# Patient Record
Sex: Female | Born: 1985 | Race: Black or African American | Hispanic: No | Marital: Married | Smoking: Never smoker
Health system: Southern US, Community
[De-identification: ages and names within clinical notes are randomized; demographics above are authoritative.]

## PROBLEM LIST (undated history)

## (undated) DIAGNOSIS — I10 Essential (primary) hypertension: Secondary | ICD-10-CM

## (undated) DIAGNOSIS — Q251 Coarctation of aorta: Secondary | ICD-10-CM

## (undated) DIAGNOSIS — N390 Urinary tract infection, site not specified: Secondary | ICD-10-CM

## (undated) DIAGNOSIS — D649 Anemia, unspecified: Secondary | ICD-10-CM

## (undated) DIAGNOSIS — F419 Anxiety disorder, unspecified: Secondary | ICD-10-CM

## (undated) HISTORY — PX: COARCTATION OF AORTA REPAIR: SHX261

## (undated) HISTORY — DX: Coarctation of aorta: Q25.1

## (undated) HISTORY — PX: TONSILLECTOMY: SUR1361

## (undated) HISTORY — PX: REFRACTIVE SURGERY: SHX103

## (undated) HISTORY — DX: Essential (primary) hypertension: I10

## (undated) HISTORY — PX: BREAST ENHANCEMENT SURGERY: SHX7

---

## 2013-04-27 DIAGNOSIS — I359 Nonrheumatic aortic valve disorder, unspecified: Secondary | ICD-10-CM | POA: Insufficient documentation

## 2013-05-17 DIAGNOSIS — Q251 Coarctation of aorta: Secondary | ICD-10-CM

## 2013-05-17 HISTORY — DX: Coarctation of aorta: Q25.1

## 2019-07-28 ENCOUNTER — Encounter (INDEPENDENT_AMBULATORY_CARE_PROVIDER_SITE_OTHER): Payer: Self-pay

## 2019-07-28 ENCOUNTER — Ambulatory Visit (INDEPENDENT_AMBULATORY_CARE_PROVIDER_SITE_OTHER): Payer: BC Managed Care – PPO | Admitting: Cardiology

## 2019-07-28 ENCOUNTER — Other Ambulatory Visit: Payer: Self-pay

## 2019-07-28 ENCOUNTER — Encounter: Payer: Self-pay | Admitting: Cardiology

## 2019-07-28 VITALS — BP 135/94 | HR 92 | Ht 68.0 in | Wt 185.4 lb

## 2019-07-28 DIAGNOSIS — Q231 Congenital insufficiency of aortic valve: Secondary | ICD-10-CM

## 2019-07-28 DIAGNOSIS — Z7189 Other specified counseling: Secondary | ICD-10-CM | POA: Diagnosis not present

## 2019-07-28 DIAGNOSIS — Q251 Coarctation of aorta: Secondary | ICD-10-CM | POA: Diagnosis not present

## 2019-07-28 NOTE — Patient Instructions (Addendum)
Medication Instructions:  NO CHANGES *If you need a refill on your cardiac medications before your next appointment, please call your pharmacy*  Lab Work: NONE NEEDED  If you have labs (blood work) drawn today and your tests are completely normal, you will receive your results only by: Marland Kitchen MyChart Message (if you have MyChart) OR . A paper copy in the mail If you have any lab test that is abnormal or we need to change your treatment, we will call you to review the results.  Testing/Procedures: Your physician has requested that you have a cardiac MRI. Cardiac MRI uses a computer to create images of your heart as its beating, producing both still and moving pictures of your heart and major blood vessels. For further information please visit [email or web address] Please follow the instruction sheet given to you today for more information.  [place]  Follow-Up: At [facility], you and your health needs are our priority.  As part of our continuing mission to provide you with exceptional heart care, we have created designated Provider Care Teams.  These Care Teams include your primary Cardiologist (physician) and Advanced Practice Providers (APPs -  Physician Assistants and Nurse Practitioners) who all work together to provide you with the care you need, when you need it.    Your next appointment:   2 year(s)  The format for your next appointment:   In Person  Provider:   Buford Dresser, MD

## 2019-07-28 NOTE — Progress Notes (Signed)
Cardiology Office Note:    Date:  07/28/2019   ID:  Cassandra Hoover, DOB July 13, 1986, MRN [id number]  PCP:  Katherina Mires, MD  Cardiologist:  Buford Dresser, MD  Referring MD: No ref. provider found   CC: new patient evaluation, establish care due to history of congenital heart disesae  History of Present Illness:    Cassandra Hoover is a 33 y.o. female with a hx of coarctation of the aorta who is seen as a new patient to establish care.  Records from Dr. Jefm Miles from 09/29/2016 reviewed. Reported at that time 3 year history of chest pain. Has a history of coarctation of the aorta, with surgery at 10 days and then balloon angioplasty at 33 years old. No issues since. Echo 09/2016 shows mildly increased velocity in the aorta.   Imaging: MRI q5 years preferred.  Believes she has had MRI and CT in the past. Echo windows have been difficult to get since breast implants. Last imaging was in [place] in 2014. Believes she had head imaging for aneurysm in ~2005/2006, was told she did not need to repeat. There was concern ~2008 that she may have bicuspid aortic valve, but she does not think this was ever definitively determined.   She has moved frequently due to [employer] career, which he has now completed.  On review of records, there is mention of bicuspid aortic valve. Echo from 09/21/16 notes normal LV size, normal EF (55%). Aortic valve not well visualized. Increased velocity in descending aorta (2.1 m/s).  Able to get through Care Everywhere today: IMPRESSION:  There is a coarct of the proximal descending thoracic aorta at the level of the left subclavian artery with minimal diameter of 1.8 x 1.7 cm.  Maximal ascending thoracic aortic measurement is 3.3 x 3.0 cm.  No evidence of dissection.    Report E-Signed By: Bennetta Laos, M.D. at 05/02/2013 1:09 PM IRC:[id number]  Result Narrative  CLINICAL DATA:  Thoracic aneurysm.  TECHNICAL DATA:  Following dynamic  intravenous nonionic contrast infusion, multiple axial helical overlapped CTA images of the chest and abdomen with multiplanar reconstructions were obtained. All CT data was transferred to a 3D workstation for multiplanar reformation and 3D reconstruction under concurrent physician supervision.  FINDINGS:  Aorta at the valve plane measures 2.9 x 2.2 cm. There is some motion artifact here. Just above the valve plane, the aorta measures 2.3 x 2.3 cm. Mid ascending thoracic aorta measures 3.3 x 3.0 cm. This gradually tapers toward the transverse arch. Classic arch anatomy is present. Focal tortuosity with an area of narrowing of the distal arch/proximal descending thoracic aorta is identified. The aorta at the distal arch just below the origin of the left subclavian artery measures 1.8 x 1.7 cm. Proximal descending thoracic aorta measures 2.5 x 2.7 cm. Mid descending thoracic aorta measures 2.5 x 2.4 cm Distal descending thoracic aorta measures 1.9 x 1.9 cm.  Celiac artery and superior mesenteric arteries are widely patent. Single renal arteries are patent bilaterally  No dissection is identified.  No evidence of mediastinal or hilar adenopathy. Minimal increased density is identified in the anterior mediastinal fat consistent with residual thymic tissue. Bilateral breast implants.  No evidence of lung consolidation. No evidence of effusion.      Past Medical History:  Diagnosis Date  . Coarctation of aorta     Past Surgical History:  Procedure Laterality Date  . COARCTATION OF AORTA REPAIR      Current Medications: Current Outpatient Medications on File Prior  to Visit  Medication Sig  . Prenatal MV & Min w/FA-DHA (ONE A DAY PRENATAL PO) Take by mouth daily.   No current facility-administered medications on file prior to visit.     Allergies:   Patient has no known allergies.   Social History   Tobacco Use  . Smoking status: Never Smoker  . Smokeless tobacco: Never  Used  Substance Use Topics  . Alcohol use: Not on file  . Drug use: Not on file    Family History: family history includes Heart failure in her maternal grandmother.  ROS:   Please see the history of present illness.  Additional pertinent ROS: Constitutional: Negative for chills, fever, night sweats, unintentional weight loss  HENT: Negative for ear pain and hearing loss.   Eyes: Negative for loss of vision and eye pain.  Respiratory: Negative for cough, sputum, wheezing.   Cardiovascular: See HPI. Gastrointestinal: Negative for abdominal pain, melena, and hematochezia.  Genitourinary: Negative for dysuria and hematuria.  Musculoskeletal: Negative for falls and myalgias.  Skin: Negative for itching and rash.  Neurological: Negative for focal weakness, focal sensory changes and loss of consciousness.  Endo/Heme/Allergies: Does not bruise/bleed easily.     EKGs/Labs/Other Studies Reviewed:    The following studies were reviewed today: Records reviewed in Care Everywhere, as noted  EKG:  EKG is personally reviewed.  The ekg ordered today demonstrates sinus rhythm with sinus arrhythmia, IVCD  Recent Labs: No results found for requested labs within last 8760 hours.  Recent Lipid Panel No results found for: CHOL, TRIG, HDL, CHOLHDL, VLDL, LDLCALC, LDLDIRECT  Physical Exam:    VS:  BP (!) 135/94 Comment: right arm  Pulse 92   Ht 5\' 8"  (1.727 m)   Wt 185 lb 6.4 oz (84.1 kg)   SpO2 97%   BMI 28.19 kg/m    BP: left arm 130/86 Right arm 135/94 Left leg 170/131 (over clothes) Right leg 164/135 (over clothes)  Wt Readings from Last 3 Encounters:  07/28/19 185 lb 6.4 oz (84.1 kg)    GEN: Well nourished, well developed in no acute distress HEENT: Normal, moist mucous membranes NECK: No JVD CARDIAC: regular rhythm, normal S1 and S2, no rubs or gallops. 2/6 SM heard best from the posterior chest. VASCULAR: Radial and DP pulses 2+ bilaterally. No carotid bruits RESPIRATORY:   Clear to auscultation without rales, wheezing or rhonchi  ABDOMEN: Soft, non-tender, non-distended MUSCULOSKELETAL:  Ambulates independently SKIN: Warm and dry, no edema NEUROLOGIC:  Alert and oriented x 3. No focal neuro deficits noted. PSYCHIATRIC:  Normal affect    ASSESSMENT:    1. Coarctation of aorta   2. Cardiac risk counseling   3. Counseling on health promotion and disease prevention   4. Bicuspid aortic valve    PLAN:    Coarctation of the aorta: leg BP actually elevated today, not consistent with severe coarctation. Murmur audible. Needs routine monitoring. With hope for pregnancy soon and desire to avoid radiation, would prefer MRA, ordered today  Reported bicuspid aortic valve: this is in her notes. However, not well seen in report from echo, not commented in recent imaging. -echo for symptoms, if MRA shows thoracic aortic aneurysm  Cardiac risk counseling and prevention recommendations: -recommend heart healthy/Mediterranean diet, with whole grains, fruits, vegetable, fish, lean meats, nuts, and olive oil. Limit salt. -recommend moderate walking, 3-5 times/week for 30-50 minutes each session. Aim for at least 150 minutes.week. Goal should be pace of 3 miles/hours, or walking 1.5 miles in 30  minutes -recommend avoidance of tobacco products. Avoid excess alcohol. -with plans for pregnancy, need baseline studies, ordered today  Plan for follow up: of MR normal, follow up in 2 years for routine assessment. Happy to see sooner if needed, especially if there are concerns for pregnancy. Coarc, especially if no significant gradient, should not affect pregnancy.  Medication Adjustments/Labs and Tests Ordered: Current medicines are reviewed at length with the patient today.  Concerns regarding medicines are outlined above.  Orders Placed This Encounter  Procedures  . MR ANGIO CHEST WO CONTRAST  . EKG 12-Lead   No orders of the defined types were placed in this  encounter.   Patient Instructions  Medication Instructions:  NO CHANGES *If you need a refill on your cardiac medications before your next appointment, please call your pharmacy*  Lab Work: NONE NEEDED  If you have labs (blood work) drawn today and your tests are completely normal, you will receive your results only by: Marland Kitchen. MyChart Message (if you have MyChart) OR . A paper copy in the mail If you have any lab test that is abnormal or we need to change your treatment, we will call you to review the results.  Testing/Procedures: Your physician has requested that you have a cardiac MRI. Cardiac MRI uses a computer to create images of your heart as its beating, producing both still and moving pictures of your heart and major blood vessels. For further information please visit [email or web address]. Please follow the instruction sheet given to you today for more information.   [facility]PITAL  Follow-Up: At [facility], you and your health needs are our priority.  As part of our continuing mission to provide you with exceptional heart care, we have created designated Provider Care Teams.  These Care Teams include your primary Cardiologist (physician) and Advanced Practice Providers (APPs -  Physician Assistants and Nurse Practitioners) who all work together to provide you with the care you need, when you need it.    Your next appointment:   2 year(s)  The format for your next appointment:   In Person  Provider:   Jodelle RedBridgette Takeyah Wieman, MD   Signed, Jodelle RedBridgette Nakya Weyand, MD PhD 07/28/2019  [facility]

## 2019-07-31 ENCOUNTER — Telehealth: Payer: Self-pay | Admitting: *Deleted

## 2019-07-31 NOTE — Telephone Encounter (Signed)
Left message for patient to call so we can discuss date for the MR Angio chest ordered by Dr. Harrell Gave

## 2019-08-09 ENCOUNTER — Ambulatory Visit (HOSPITAL_COMMUNITY): Admission: RE | Admit: 2019-08-09 | Payer: BC Managed Care – PPO | Source: Ambulatory Visit

## 2019-08-09 ENCOUNTER — Encounter (HOSPITAL_COMMUNITY): Payer: Self-pay

## 2019-08-09 ENCOUNTER — Telehealth: Payer: Self-pay

## 2019-08-09 NOTE — Telephone Encounter (Signed)
Received a call from [place] with [facility] calling to find out what Dr.Christopher is looking for.Stated she thinks MRI was scheduled at wrong hospital.After reviewing chart a Cardiac MRI was ordered.Stated she will reschedule her Cardiac MRI to be done at [facility].

## 2019-08-11 NOTE — L&D Delivery Note (Signed)
OB/GYN Faculty Practice Delivery Note  Cassandra Hoover is a 34 y.o. G1P0 s/p Vaginal Delivery at [redacted]w[redacted]d. She was admitted for IOL s/t GHTN.   ROM: 8h 22m with clear fluid GBS Status: Positive with adequate treatment   Labor Progress: . Patient was admitted for IOL for GHTN and received inpatient foley as well as cytotec and pitocin.  Patient with AROM at 6cm and progressed to complete with pitocin titration.  Patient received IV pain medication and an epidural for pain management.  She received PCN for GBS prophylaxis.   Delivery Date/Time: Jun 06, 2020 at 1844 Delivery: Called to room and patient was complete and pushing. Patient continued to push with provider support for ~ 60 minutes.  Head delivered in Right Occiput Anterior. No nuchal cord appreciated. Shoulder and body delivered in usual fashion. Infant with spontaneous cry and good grimace and was given tactile stimulation by provider.  Infant placed on mother's abdomen where nurses continued stimulation. Cord clamped x 2 after 4 minute delay, and cut by FOB-Cassandra Hoover. Cord blood drawn. Vaginal inspection revealed a 2nd degree perineal laceration that was repaired with 3-0 vicryl on CT-1.  No additional anesthetic necessary and patient tolerated the procedure well. Placenta expressed with fundal massage, gentle cord traction, and initiation of pitocin infusion after ~ 35 minutes. Fundus firm, at the umbilicus, and bleeding small.  Mother hemodynamically stable and infant skin to skin with father prior to provider exit.  Mother unsure of desire birth control and opts to breastfeed.    Placenta: Spontaneous, Shultz, Intact Complications: None Lacerations: 2nd Degree Perineal, Right Side Periurethral Laceration-Hemostatic and Unrepaired EBL: 300 Analgesia: Epidural  Postpartum Planning Arly.Keller ] message to sent to schedule follow-up  Arly.Keller ] vaccines UTD  Infant: Cassandra Hoover 8/9  5lbs, 13.7oz, 18 in  Cassandra Hoover, CNM  06/06/2020 8:25  PM

## 2019-08-14 ENCOUNTER — Other Ambulatory Visit: Payer: Self-pay

## 2019-08-14 DIAGNOSIS — Q251 Coarctation of aorta: Secondary | ICD-10-CM

## 2019-08-14 NOTE — Telephone Encounter (Signed)
MD has spoken to pt via mychart.

## 2019-08-18 ENCOUNTER — Encounter: Payer: Self-pay | Admitting: Cardiology

## 2019-08-18 ENCOUNTER — Telehealth: Payer: Self-pay | Admitting: Cardiology

## 2019-08-18 ENCOUNTER — Telehealth: Payer: Self-pay | Admitting: *Deleted

## 2019-08-18 NOTE — Telephone Encounter (Signed)
Patient is scheduled for a Cardiac MRI for 09/08/19 at 8:00am. She states that she can only have afternoon appts due to work.

## 2019-08-18 NOTE — Telephone Encounter (Signed)
Left message regarding appointment for Cardiac MRI scheduled 09/08/19 at 8:00 am--arrival time is 7:15 am --1st floor admissions office.  Information is available in My Chart and I will also mail information to paient

## 2019-08-25 ENCOUNTER — Telehealth: Payer: Self-pay | Admitting: Cardiology

## 2019-08-25 NOTE — Telephone Encounter (Signed)
Patient calling to reschedule Cardiac MRI scheduled for 09/08/19.

## 2019-08-27 ENCOUNTER — Encounter: Payer: Self-pay | Admitting: Cardiology

## 2019-09-08 ENCOUNTER — Other Ambulatory Visit (HOSPITAL_COMMUNITY): Payer: BC Managed Care – PPO

## 2019-09-12 ENCOUNTER — Encounter (HOSPITAL_COMMUNITY): Payer: Self-pay

## 2019-09-12 ENCOUNTER — Telehealth (HOSPITAL_COMMUNITY): Payer: Self-pay | Admitting: Emergency Medicine

## 2019-09-12 NOTE — Telephone Encounter (Signed)
Left message on voicemail with name and callback number Glanda Spanbauer RN Navigator Cardiac Imaging [facility] [phone] Office [phone] Cell  

## 2019-09-13 ENCOUNTER — Other Ambulatory Visit: Payer: Self-pay | Admitting: Cardiology

## 2019-09-13 ENCOUNTER — Other Ambulatory Visit: Payer: Self-pay | Admitting: *Deleted

## 2019-09-13 ENCOUNTER — Other Ambulatory Visit: Payer: Self-pay

## 2019-09-13 ENCOUNTER — Ambulatory Visit (HOSPITAL_COMMUNITY)
Admission: RE | Admit: 2019-09-13 | Discharge: 2019-09-13 | Disposition: A | Discharge status: Home / Self Care | Payer: BC Managed Care – PPO | Source: Ambulatory Visit | Attending: Cardiology | Admitting: Cardiology

## 2019-09-13 DIAGNOSIS — Q251 Coarctation of aorta: Secondary | ICD-10-CM

## 2019-09-13 MED ORDER — LORAZEPAM 2 MG/ML IJ SOLN
INTRAMUSCULAR | Status: AC
  Administered 2019-09-13: 1 mg via INTRAVENOUS
  Filled 2019-09-13: qty 1

## 2019-09-13 MED ORDER — LORAZEPAM 2 MG/ML IJ SOLN
1.0000 mg | Freq: Once | INTRAMUSCULAR | Status: AC
  Filled 2019-09-13: qty 0.5

## 2019-09-13 MED ORDER — GADOBUTROL 1 MMOL/ML IV SOLN
10.0000 mL | Freq: Once | INTRAVENOUS | Status: AC | PRN
  Administered 2019-09-13: 10 mL via INTRAVENOUS

## 2019-10-24 ENCOUNTER — Encounter: Payer: Self-pay | Admitting: General Practice

## 2019-10-24 ENCOUNTER — Ambulatory Visit (INDEPENDENT_AMBULATORY_CARE_PROVIDER_SITE_OTHER): Payer: BC Managed Care – PPO | Admitting: *Deleted

## 2019-10-24 ENCOUNTER — Other Ambulatory Visit: Payer: Self-pay

## 2019-10-24 VITALS — BP 130/81 | HR 88 | Temp 98.0°F | Ht 67.0 in | Wt 186.0 lb

## 2019-10-24 DIAGNOSIS — Z3400 Encounter for supervision of normal first pregnancy, unspecified trimester: Secondary | ICD-10-CM | POA: Insufficient documentation

## 2019-10-24 DIAGNOSIS — Z3200 Encounter for pregnancy test, result unknown: Secondary | ICD-10-CM

## 2019-10-24 DIAGNOSIS — Z3201 Encounter for pregnancy test, result positive: Secondary | ICD-10-CM | POA: Diagnosis not present

## 2019-10-24 LAB — POCT URINE PREGNANCY: Preg Test, Ur: POSITIVE — AB

## 2019-10-24 MED ORDER — BLOOD PRESSURE MONITOR AUTOMAT DEVI: 1.0000 | Freq: Every day | 0 refills | Status: DC

## 2019-10-24 MED ORDER — BLOOD PRESSURE MONITOR AUTOMAT DEVI: 1.0000 | Freq: Every day | 0 refills | Status: AC

## 2019-10-24 NOTE — Progress Notes (Signed)
   PRENATAL INTAKE SUMMARY  Ms. Tomassi presents today New OB Nurse Interview.  OB History    Gravida  1   Para      Term      Preterm      AB      Living        SAB      TAB      Ectopic      Multiple      Live Births             I have reviewed the patient's medical, obstetrical, social, and family histories, medications, and available lab results.  SUBJECTIVE She has no unusual complaints.  OBJECTIVE Initial nurse interview for history (New OB) and pregnancy test.  EDD: 06/10/2020 by LMP GA: [redacted]w[redacted]d G1P0  GENERAL APPEARANCE: alert, well appearing, in no apparent distress, oriented to person, place and time, well hydrated.   ASSESSMENT Normal pregnancy Positive pregnancy test  PLAN Prenatal care-[facility] to be completed at next visit Patient to sign up for Babyscripts Continue PNV Appointment with Raelyn Mora, CNM 11/23/19 Rx for blood pressure monitor sent to [facility]  Clovis Pu, RN

## 2019-10-26 ENCOUNTER — Ambulatory Visit: Payer: BC Managed Care – PPO | Attending: Internal Medicine

## 2019-10-26 DIAGNOSIS — Z23 Encounter for immunization: Secondary | ICD-10-CM

## 2019-10-26 NOTE — Progress Notes (Signed)
   Covid-19 Vaccination Clinic  Name:  Cassandra Hoover    MRN: [id number] DOB: [birth year]  10/26/2019  [name] was observed post Covid-19 immunization for 15 minutes without incident. She was provided with Vaccine Information Sheet and instruction to access the V-Safe system.   [name] was instructed to call 911 with any severe reactions post vaccine: Marland Kitchen Difficulty breathing  . Swelling of face and throat  . A fast heartbeat  . A bad rash all over body  . Dizziness and weakness   Immunizations Administered    Name Date Dose VIS Date Route   Pfizer COVID-19 Vaccine 10/26/2019  2:50 PM 0.3 mL 07/21/2019 Intramuscular   Manufacturer: [employer], [employer]   Lot: [id number]   NDC: [address]-7

## 2019-11-15 ENCOUNTER — Ambulatory Visit: Payer: BC Managed Care – PPO | Attending: Internal Medicine

## 2019-11-15 DIAGNOSIS — Z23 Encounter for immunization: Secondary | ICD-10-CM

## 2019-11-15 NOTE — Progress Notes (Signed)
   Covid-19 Vaccination Clinic  Name:  Cassandra Hoover    MRN: [id number] DOB: September 29, 1985  11/15/2019  Cassandra Hoover was observed post Covid-19 immunization for 15 minutes without incident. She was provided with Vaccine Information Sheet and instruction to access the V-Safe system.   Cassandra Hoover was instructed to call 911 with any severe reactions post vaccine: Marland Kitchen Difficulty breathing  . Swelling of face and throat  . A fast heartbeat  . A bad rash all over body  . Dizziness and weakness   Immunizations Administered    Name Date Dose VIS Date Route   Pfizer COVID-19 Vaccine 11/15/2019 12:33 PM 0.3 mL 07/21/2019 Intramuscular   Manufacturer: [employer], [employer]   Lot: [id number]   NDC: [address]-4

## 2019-11-20 ENCOUNTER — Ambulatory Visit: Payer: BC Managed Care – PPO

## 2019-11-22 ENCOUNTER — Other Ambulatory Visit (HOSPITAL_COMMUNITY)
Admission: RE | Admit: 2019-11-22 | Discharge: 2019-11-22 | Disposition: A | Discharge status: Home / Self Care | Payer: BC Managed Care – PPO | Source: Ambulatory Visit | Attending: Obstetrics and Gynecology | Admitting: Obstetrics and Gynecology

## 2019-11-22 ENCOUNTER — Ambulatory Visit (INDEPENDENT_AMBULATORY_CARE_PROVIDER_SITE_OTHER): Payer: BC Managed Care – PPO | Admitting: Student

## 2019-11-22 ENCOUNTER — Other Ambulatory Visit: Payer: Self-pay

## 2019-11-22 ENCOUNTER — Encounter: Payer: Self-pay | Admitting: Student

## 2019-11-22 VITALS — BP 138/81 | HR 90 | Temp 98.0°F | Wt 186.6 lb

## 2019-11-22 DIAGNOSIS — Z3400 Encounter for supervision of normal first pregnancy, unspecified trimester: Secondary | ICD-10-CM | POA: Insufficient documentation

## 2019-11-22 DIAGNOSIS — Q251 Coarctation of aorta: Secondary | ICD-10-CM

## 2019-11-22 NOTE — Patient Instructions (Signed)
The Maternity Assessment Unit (MAU) is located at the [facility] and [place] at [facility]. The address is: [address] [place], [place], [place] [address]. Please see map below for additional directions.    The Maternity Assessment Unit is designed to help you during your pregnancy, and for up to 6 weeks after delivery, with any pregnancy- or postpartum-related emergencies, if you think you are in labor, or if your water has broken. For example, if you experience nausea and vomiting, vaginal bleeding, severe abdominal or pelvic pain, elevated blood pressure or other problems related to your pregnancy or postpartum time, please come to the Maternity Assessment Unit for assistance.   Considering Waterbirth? Guide for patients at [facility] [employer]  Why consider waterbirth?  . Gentle birth for babies . Less pain medicine used in labor . May allow for passive descent/less pushing . May reduce perineal tears  . More mobility and instinctive maternal position changes . Increased maternal relaxation . Reduced blood pressure in labor  Is waterbirth safe? What are the risks of infection, drowning or other complications?  . Infection: o Very low risk (3.7 % for tub vs 4.8% for bed) o 7 in 8000 waterbirths with documented infection o Poorly cleaned equipment most common cause o Slightly lower group B strep transmission rate  . Drowning o Maternal:  - Very low risk   - Related to seizures or fainting o Newborn:  - Very low risk. No evidence of increased risk of respiratory problems in multiple large studies - Physiological protection from breathing under water - Avoid underwater birth if there are any fetal complications - Once baby's head is out of the water, keep it out.  . Birth complication o Some reports of cord trauma, but risk decreased by bringing baby to surface gradually o No evidence of increased risk of shoulder dystocia. Mothers can  usually change positions faster in water than in a bed, possibly aiding the maneuvers to free the shoulder.   You must attend a Doren Custard class at [facility]  3rd Wednesday of every month from 7-9pm  [employer] by calling [phone] or online at [email or web address]  Bring [place] the certificate from the class to your prenatal appointment  Meet with a midwife at 36 weeks to see if you can still plan a waterbirth and to sign the consent.   If you plan a waterbirth at [facility] and [facility], you will need to purchase the following:  Fish net  Bathing suit top (optional)  Long-handled mirror (optional)  Places to purchase or rent supplies:   GotWeb[employer].is for tub purchases and supplies  Waterbirthsolutions.com for tub purchases and supplies  The Labor Ladies (www.thelaborladies.com) $275 for tub rental/set-up & take down/kit   [employer] (http://www.fleming.com/.htm) Information regarding doulas (labor support) who provide pool rentals  Things that would prevent you from having a waterbirth:  Premature, <37wks  Previous cesarean birth  Presence of thick meconium-stained fluid  Multiple gestation (Twins, triplets, etc.)  Uncontrolled diabetes or gestational diabetes requiring medication  Hypertension requiring medication or diagnosis of pre-eclampsia  Heavy vaginal bleeding  Non-reassuring fetal heart rate  Active infection (MRSA, etc.). Group B Strep is NOT a contraindication for waterbirth.  If your labor has to be induced and induction method requires continuous monitoring of the baby's heart rate  Other risks/issues identified by your obstetrical provider  Please remember that birth is unpredictable. Under certain unforeseeable circumstances your provider may  advise against giving birth in the tub. These decisions will be made on a case-by-case basis and with the safety of you and your baby  as our highest priority.   Childbirth Education Options: [facility] Classes:  Childbirth education classes can help you get ready for a positive parenting experience. You can also meet other expectant parents and get free stuff for your baby. Each class runs for five weeks on the same night and costs $45 for the mother-to-be and her support person. Medicaid covers the cost if you are eligible. Call [phone] to register. [facility] Childbirth Education:  [phone] or [phone] or [email or web address]  Baby & Me Class: Discuss newborn & infant parenting and family adjustment issues with other new mothers in a relaxed environment. Each week brings a new speaker or baby-centered activity. We encourage new mothers to join [place] every Thursday at 11:00am. Babies birth until crawling. No registration or fee. Daddy [employer]: This course offers Dads-to-be the tools and knowledge needed to feel confident on their journey to becoming new fathers. Experienced dads, who have been trained as coaches, teach dads-to-be how to hold, comfort, diaper, swaddle and play with their infant while being able to support the new mom as well. A class for men taught by men. $25/dad Big Brother/Big Sister: Let your children share in the joy of a new brother or sister in this special class designed just for them. Class includes discussion about how families care for babies: swaddling, holding, diapering, safety as well as how they can be helpful in their new role. This class is designed for children ages 10 to 69, but any age is welcome. Please register each child individually. $5/child  Mom Talk: This mom-led group offers support and connection to mothers as they journey through the adjustments and struggles of that sometimes overwhelming first year after the birth of a child. Tuesdays at 10:00am and Thursdays at 6:00pm. Babies welcome. No registration or fee. Breastfeeding Support Group: This  group is a mother-to-mother support circle where moms have the opportunity to share their breastfeeding experiences. A Lactation Consultant is present for questions and concerns. Meets each Tuesday at 11:00am. No fee or registration. Breastfeeding Your Baby: Learn what to expect in the first days of breastfeeding your newborn.  This class will help you feel more confident with the skills needed to begin your breastfeeding experience. Many new mothers are concerned about breastfeeding after leaving the hospital. This class will also address the most common fears and challenges about breastfeeding during the first few weeks, months and beyond. (call for fee) Comfort Techniques and Tour: This 2 hour interactive class will provide you the opportunity to learn & practice hands-on techniques that can help relieve some of the discomfort of labor and encourage your baby to rotate toward the best position for birth. You and your partner will be able to try a variety of labor positions with birth balls and rebozos as well as practice breathing, relaxation, and visualization techniques. A tour of the [facility] is included with this class. $20 per registrant and support person Childbirth Class- Weekend Option: This class is a Weekend version of our Birth & Baby series. It is designed for parents who have a difficult time fitting several weeks of classes into their schedule. It covers the care of your newborn and the basics of labor and childbirth. It also includes a [place] [facility] and lunch. The class is held two consecutive  days: beginning on Friday evening from 6:30 - 8:30 p.m. and the next day, Saturday from 9 a.m. - 4 p.m. (call for fee) Doren Custard Class: Interested in a waterbirth?  This informational class will help you discover whether waterbirth is the right fit for you. Education about waterbirth itself, supplies you would need and how to assemble your  support team is what you can expect from this class. Some obstetrical practices require this class in order to pursue a waterbirth. (Not all obstetrical practices offer waterbirth-check with your healthcare provider.) Register only the expectant mom, but you are encouraged to bring your partner to class! Required if planning waterbirth, no fee. Infant/Child CPR: Parents, grandparents, babysitters, and friends learn Cardio-Pulmonary Resuscitation skills for infants and children. You will also learn how to treat both conscious and unconscious choking in infants and children. This Family & Friends program does not offer certification. Register each participant individually to ensure that enough mannequins are available. (Call for fee) Grandparent Love: Expecting a grandbaby? This class is for you! Learn about the latest infant care and safety recommendations and ways to support your own child as he or she transitions into the parenting role. Taught by Registered Nurses who are childbirth instructors, but most importantly...they are grandmothers too! $10/person. Childbirth Class- Natural Childbirth: This series of 5 weekly classes is for expectant parents who want to learn and practice natural methods of coping with the process of labor and childbirth. Relaxation, breathing, massage, visualization, role of the partner, and helpful positioning are highlighted. Participants learn how to be confident in their body's ability to give birth. This class will empower and help parents make informed decisions about their own care. Includes discussion that will help new parents transition into the immediate postpartum period. [facility] is included. We suggest taking this class between 25-32 weeks, but it's only a recommendation. $75 per registrant and one support person or $30 Medicaid. Childbirth Class- 3 week Series: This option of 3 weekly classes helps you and your labor partner prepare  for childbirth. Newborn care, labor & birth, cesarean birth, pain management, and comfort techniques are discussed and a [place] [facility] is included. The class meets at the same time, on the same day of the week for 3 consecutive weeks beginning with the starting date you choose. $60 for registrant and one support person.  Marvelous Multiples: Expecting twins, triplets, or more? This class covers the differences in labor, birth, parenting, and breastfeeding issues that face multiples' parents. NICU tour is included. Led by a Certified Childbirth Educator who is the mother of twins. No fee. Caring for Baby: This class is for expectant and adoptive parents who want to learn and practice the most up-to-date newborn care for their babies. Focus is on birth through the first six weeks of life. Topics include feeding, bathing, diapering, crying, umbilical cord care, circumcision care and safe sleep. Parents learn to recognize symptoms of illness and when to call the pediatrician. Register only the mom-to-be and your partner or support person can plan to come with you! $10 per registrant and support person Childbirth Class- online option: This online class offers you the freedom to complete a Birth and Baby series in the comfort of your own home. The flexibility of this option allows you to review sections at your own pace, at times convenient to you and your support people. It includes additional video information, animations, quizzes, and extended activities. Get organized with helpful  eClass tools, checklists, and trackers. Once you register online for the class, you will receive an email within a few days to accept the invitation and begin the class when the time is right for you. The content will be available to you for 60 days. $60 for 60 days of online access for you and your support people.        Safe Medications in Pregnancy   Acne: Benzoyl Peroxide Salicylic  Acid  Backache/Headache: Tylenol: 2 regular strength every 4 hours OR              2 Extra strength every 6 hours  Colds/Coughs/Allergies: Benadryl (alcohol free) 25 mg every 6 hours as needed Breath right strips Claritin Cepacol throat lozenges Chloraseptic throat spray Cold-Eeze- up to three times per day Cough drops, alcohol free Flonase (by prescription only) Guaifenesin Mucinex Robitussin DM (plain only, alcohol free) Saline nasal spray/drops Sudafed (pseudoephedrine) & Actifed ** use only after [redacted] weeks gestation and if you do not have high blood pressure Tylenol Vicks Vaporub Zinc lozenges Zyrtec   Constipation: Colace Ducolax suppositories Fleet enema Glycerin suppositories Metamucil Milk of magnesia Miralax Senokot Smooth move tea  Diarrhea: Kaopectate Imodium A-D  *NO pepto Bismol  Hemorrhoids: Anusol Anusol HC Preparation H Tucks  Indigestion: Tums Maalox Mylanta Zantac  Pepcid  Insomnia: Benadryl (alcohol free) 71m every 6 hours as needed Tylenol PM Unisom, no Gelcaps  Leg Cramps: Tums MagGel  Nausea/Vomiting:  Bonine Dramamine Emetrol Ginger extract Sea bands Meclizine  Nausea medication to take during pregnancy:  Unisom (doxylamine succinate 25 mg tablets) Take one tablet daily at bedtime. If symptoms are not adequately controlled, the dose can be increased to a maximum recommended dose of two tablets daily (1/2 tablet in the morning, 1/2 tablet mid-afternoon and one at bedtime). Vitamin B6 1021mtablets. Take one tablet twice a day (up to 200 mg per day).  Skin Rashes: Aveeno products Benadryl cream or 2523mvery 6 hours as needed Calamine Lotion 1% cortisone cream  Yeast infection: Gyne-lotrimin 7 Monistat 7  Gum/tooth pain: Anbesol  **If taking multiple medications, please check labels to avoid duplicating the same active ingredients **take medication as directed on the label ** Do not exceed 4000 mg of  tylenol in 24 hours **Do not take medications that contain aspirin or ibuprofen

## 2019-11-22 NOTE — Progress Notes (Signed)
Subjective:   Cassandra Hoover is a 34 y.o. G1P0 at [redacted]w[redacted]d by LMP being seen today for her first obstetrical visit.  Her obstetrical history is significant for first pregnancy. Patient does intend to breast feed. Pregnancy history fully reviewed. This is first pregnancy for her and her spouse. She is taking prenatal vitamins & allergy medication prn.  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 Her cardiologist is not aware that she is currently pregnant but was aware that she was trying to conceive. Had a normal MRI in January.  She had a normal pap smear in December 2020.   Patient reports no complaints.  HISTORY: OB History  Gravida Para Term Preterm AB Living  1 0 0 0 0 0  SAB TAB Ectopic Multiple Live Births  0 0 0 0 0    # Outcome Date GA Lbr Len/2nd Weight Sex Delivery Anes PTL Lv  1 Current            Past Medical History:  Diagnosis Date  . Coarctation of aorta, congenital    surgical repair at 26 days old and 13 years   Past Surgical History:  Procedure Laterality Date  . COARCTATION OF AORTA REPAIR     Family History  Problem Relation Age of Onset  . Heart failure Maternal Grandmother    Social History   Tobacco Use  . Smoking status: Never Smoker  . Smokeless tobacco: Never Used  Substance Use Topics  . Alcohol use: Not Currently    Alcohol/week: 3.0 - 4.0 standard drinks    Types: 3 - 4 Glasses of wine per week    Comment: socially  . Drug use: Never   No Known Allergies Current Outpatient Medications on File Prior to Visit  Medication Sig Dispense Refill  . Blood Pressure Monitoring (BLOOD PRESSURE MONITOR AUTOMAT) DEVI 1 Device by Does not apply route daily. Automatic blood pressure cuff regular size. To monitor blood pressure regularly at home. ICD-10 code: O09.90 1 each 0  . Prenatal MV & Min w/FA-DHA (ONE A DAY PRENATAL PO) Take 1 tablet by mouth daily.     No current  facility-administered medications on file prior to visit.      Exam   Vitals:   11/22/19 0815  BP: 138/81  Pulse: 90  Temp: 98 F (36.7 C)  Weight: 186 lb 9.6 oz (84.6 kg)      Uterus:   enlarged, 10 wks  Pelvic Exam: Perineum: no hemorrhoids, normal perineum   Vulva: normal external genitalia, no lesions   Vagina:  normal mucosa, normal discharge   Cervix: No CMT    Adnexa: normal adnexa and no mass, fullness, tenderness   Bony Pelvis: average  System: General: well-developed, well-nourished female in no acute distress   Breast:  normal appearance, no masses or tenderness   Skin: normal coloration and turgor, no rashes   Neurologic: oriented, normal, negative, normal mood   Extremities: normal strength, tone, and muscle mass, ROM of all joints is normal   HEENT PERRLA, extraocular movement intact and sclera clear, anicteric   Mouth/Teeth mucous membranes moist, pharynx normal without lesions and dental hygiene good   Neck supple and no masses   Cardiovascular: regular rate and rhythm   Respiratory:  no respiratory distress, normal breath sounds   Abdomen: soft, non-tender; bowel sounds normal; no masses,  no organomegaly     Assessment:  Pregnancy: G1P0 Patient Active Problem List   Diagnosis Date Noted  . Coarctation of aorta, congenital   . Supervision of normal first pregnancy, antepartum 10/24/2019  . Coarctation of aorta 05/17/2013  . 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 Supervision of normal first pregnancy, antepartum -interested in waterbirth or natural birth. Will given information about child birth education and waterbirth.  -Does not want to know gender  - Cervicovaginal ancillary only( [place]) - Obstetric Panel, Including HIV - Hepatitis C Antibody - Culture, OB Urine - Genetic Screening   2. Coarctation of aorta, congenital -c/w Dr. Harolyn Rutherford -- will get nuchal translucency & MFM consult. Patient may potentially be transferred to  the [facility] office and is aware of that.   - AMB referral to maternal fetal medicine - [place] MFM Fetal Nuchal Translucency; Future   Initial labs drawn. Continue prenatal vitamins. Genetic Screening discussed, NIPS: ordered. Ultrasound discussed; fetal anatomic survey: ordered. Problem list reviewed and updated. The nature of [place] with multiple MDs and other Advanced Practice Providers was explained to patient; also emphasized that residents, students are part of our team. Routine obstetric precautions reviewed. No follow-ups on file.   Jorje Guild 9:25 AM 11/22/19

## 2019-11-23 ENCOUNTER — Encounter: Payer: BC Managed Care – PPO | Admitting: Obstetrics and Gynecology

## 2019-11-23 LAB — OBSTETRIC PANEL, INCLUDING HIV
Antibody Screen: NEGATIVE
Basophils Absolute: 0 10*3/uL (ref 0.0–0.2)
Basos: 0 %
EOS (ABSOLUTE): 0.4 10*3/uL (ref 0.0–0.4)
Eos: 4 %
HIV Screen 4th Generation wRfx: NONREACTIVE
Hematocrit: 36.4 % (ref 34.0–46.6)
Hemoglobin: 12.7 g/dL (ref 11.1–15.9)
Hepatitis B Surface Ag: NEGATIVE
Immature Grans (Abs): 0 10*3/uL (ref 0.0–0.1)
Immature Granulocytes: 0 %
Lymphocytes Absolute: 2.3 10*3/uL (ref 0.7–3.1)
Lymphs: 25 %
MCH: 30.9 pg (ref 26.6–33.0)
MCHC: 34.9 g/dL (ref 31.5–35.7)
MCV: 89 fL (ref 79–97)
Monocytes Absolute: 0.7 10*3/uL (ref 0.1–0.9)
Monocytes: 8 %
Neutrophils Absolute: 5.5 10*3/uL (ref 1.4–7.0)
Neutrophils: 63 %
Platelets: 337 10*3/uL (ref 150–450)
RBC: 4.11 x10E6/uL (ref 3.77–5.28)
RDW: 12.2 % (ref 11.7–15.4)
RPR Ser Ql: NONREACTIVE
Rh Factor: NEGATIVE
Rubella Antibodies, IGG: 3.33 index (ref >0.99)
WBC: 9 10*3/uL (ref 3.4–10.8)

## 2019-11-23 LAB — CERVICOVAGINAL ANCILLARY ONLY
Chlamydia: NEGATIVE
Comment: NEGATIVE
Comment: NORMAL
Neisseria Gonorrhea: NEGATIVE

## 2019-11-23 LAB — HEPATITIS C ANTIBODY: Hep C Virus Ab: 0.1 s/co ratio (ref 0.0–0.9)

## 2019-11-24 ENCOUNTER — Encounter: Payer: Self-pay | Admitting: General Practice

## 2019-11-24 LAB — CULTURE, OB URINE

## 2019-11-24 LAB — URINE CULTURE, OB REFLEX

## 2019-11-27 ENCOUNTER — Encounter: Payer: Self-pay | Admitting: Student

## 2019-11-27 DIAGNOSIS — Z6791 Unspecified blood type, Rh negative: Secondary | ICD-10-CM

## 2019-11-27 DIAGNOSIS — O26899 Other specified pregnancy related conditions, unspecified trimester: Secondary | ICD-10-CM | POA: Insufficient documentation

## 2019-11-27 DIAGNOSIS — O26891 Other specified pregnancy related conditions, first trimester: Secondary | ICD-10-CM | POA: Insufficient documentation

## 2019-11-27 HISTORY — DX: Other specified pregnancy related conditions, unspecified trimester: O26.899

## 2019-11-27 HISTORY — DX: Other specified pregnancy related conditions, unspecified trimester: Z67.91

## 2019-12-01 ENCOUNTER — Other Ambulatory Visit: Payer: Self-pay

## 2019-12-01 ENCOUNTER — Other Ambulatory Visit: Payer: Self-pay | Admitting: Obstetrics & Gynecology

## 2019-12-01 ENCOUNTER — Ambulatory Visit (HOSPITAL_COMMUNITY): Payer: BC Managed Care – PPO

## 2019-12-01 ENCOUNTER — Ambulatory Visit (HOSPITAL_COMMUNITY)
Admission: RE | Admit: 2019-12-01 | Discharge: 2019-12-01 | Disposition: A | Discharge status: Home / Self Care | Payer: BC Managed Care – PPO | Source: Ambulatory Visit | Attending: Obstetrics & Gynecology | Admitting: Obstetrics & Gynecology

## 2019-12-01 ENCOUNTER — Encounter: Payer: Self-pay | Admitting: General Practice

## 2019-12-01 ENCOUNTER — Other Ambulatory Visit (HOSPITAL_COMMUNITY): Payer: Self-pay | Admitting: *Deleted

## 2019-12-01 ENCOUNTER — Ambulatory Visit (HOSPITAL_BASED_OUTPATIENT_CLINIC_OR_DEPARTMENT_OTHER): Payer: BC Managed Care – PPO

## 2019-12-01 ENCOUNTER — Encounter (HOSPITAL_COMMUNITY): Payer: Self-pay

## 2019-12-01 ENCOUNTER — Ambulatory Visit (HOSPITAL_COMMUNITY): Payer: BC Managed Care – PPO | Admitting: *Deleted

## 2019-12-01 DIAGNOSIS — O26891 Other specified pregnancy related conditions, first trimester: Secondary | ICD-10-CM

## 2019-12-01 DIAGNOSIS — Z3A10 10 weeks gestation of pregnancy: Secondary | ICD-10-CM

## 2019-12-01 DIAGNOSIS — Q251 Coarctation of aorta: Secondary | ICD-10-CM | POA: Diagnosis present

## 2019-12-01 DIAGNOSIS — Z3400 Encounter for supervision of normal first pregnancy, unspecified trimester: Secondary | ICD-10-CM | POA: Insufficient documentation

## 2019-12-01 DIAGNOSIS — O30041 Twin pregnancy, dichorionic/diamniotic, first trimester: Secondary | ICD-10-CM | POA: Diagnosis not present

## 2019-12-01 DIAGNOSIS — Z363 Encounter for antenatal screening for malformations: Secondary | ICD-10-CM | POA: Diagnosis not present

## 2019-12-01 DIAGNOSIS — O3120X0 Continuing pregnancy after intrauterine death of one fetus or more, unspecified trimester, not applicable or unspecified: Secondary | ICD-10-CM

## 2019-12-01 DIAGNOSIS — O2691 Pregnancy related conditions, unspecified, first trimester: Secondary | ICD-10-CM

## 2019-12-01 DIAGNOSIS — O30001 Twin pregnancy, unspecified number of placenta and unspecified number of amniotic sacs, first trimester: Secondary | ICD-10-CM

## 2019-12-01 DIAGNOSIS — O3111X0 Continuing pregnancy after spontaneous abortion of one fetus or more, first trimester, not applicable or unspecified: Secondary | ICD-10-CM

## 2019-12-01 DIAGNOSIS — Z6791 Unspecified blood type, Rh negative: Secondary | ICD-10-CM | POA: Insufficient documentation

## 2019-12-01 MED ORDER — RHO D IMMUNE GLOBULIN 1500 UNIT/2ML IJ SOSY
300.0000 ug | PREFILLED_SYRINGE | Freq: Once | INTRAMUSCULAR | Status: AC
  Administered 2019-12-01: 10:00:00 300 ug via INTRAMUSCULAR

## 2019-12-01 NOTE — Consult Note (Signed)
Maternal-Fetal Medicine   Name: Cassandra Hoover DOB: 12/05/1985 MRN: [id number] Referring Provider: Jaynie Collins, MD  I had the pleasure of seeing Cassandra Hoover today at the [facility].  She is G1 P0 at 12-weeks' gestation and is here for first trimester ultrasound and consultation.  Her past medical history is significant for repaired coarctation of aorta.  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  Subsequently, at 34 years of age she had balloon angioplasty to dilate coarctation.  Patient reports she had no further narrowing and follow-up imaging studies were normal.  Also informed that she has bicuspid aortic valve. Patient reports her family was screened (examination) for Marfan syndrome and was ruled out. Patient was seen by her cardiologist, Dr. Jodelle Red. CT angiography showed normal aorta with no narrowing or dissection. Arch is normal. EKG was reported as normal. Echo was not performed in the past because of breast implants (poor resolution). Apparently, bicuspid aortic valve was suspected (not confirmed) in the past.  Review of systems: No shortness of breath on moderate to strenuous activities, no chest pain or palpitations.  No severe headaches or visual disturbances.  No abdominal or joint pains.  No vaginal bleeding.  Past medical history: No history of hypertension or diabetes or sickle cell disease or trait. Past surgical history: Coarctation of aorta, balloon angioplasty, tonsillectomy (2016), Lasik surgery 10/13/2017), breast augmentation (2012). Medications: Prenatal vitamins. Allergies: No known drug allergies. Social history: Denies tobacco drug or alcohol use.  She has been married 10 years and her husband is in good health he is Caucasian blood type is B positive. Family history of abnormal Pap smears in 2007 no history of venous thromboembolism in the family. GYN history: History of  abnormal Pap smear in 2007.  No history of cervical surgeries.  Unsure of her LMP.  No history of sexually transmitted diseases. Obstetric history: G1 P0  We performed ultrasound today.  Dichorionic diamniotic twin gestation was seen.  Unfortunately, fetal heart activity was not seen in twin A. The CRL measurement consistent with 8w 5d gestation.  Twin B: The CRL that is consistent with 10w 4d patient and good fetal heart activity seen.  We have assigned her EDD at 06/24/2020 based on the CRL measurement of twin B.  I counseled the patient on the following: Twin pregnancy with embryonic demise of twin -I explained the chorionic city and the lack of fetal heart activity in twin A with real ultrasound images. -Explained that early pregnancy loss of one twin ("vanishing twin") can occur in about 20% to 25% of cases (average). -Loss of one twin early in pregnancy is not associated with any adverse outcomes in the surviving twin. I reassured the patient that we should expect good pregnancy course and outcomes. -She may experience some vaginal spotting/bleeding because twin A is closer to the cervix. If she experiences heavy bleeding with or without clots, she should contact your office. Ultrasound may be indicated. -I explained that cell-free fetal DNA screening results can be abnormal if twin A (demise) has chromosomal anomalies. We do not usually recommend repeating cell-free fetal DNA screening. Some reports have shown that repeating screening after 8 to 10 weeks may be recommended. -First-trimester screening cannot be performed as it incorporates maternal blood (PAPP-A and beta hCG). -Patient's blood type is O negative. I informed her that if fetal death occurs in the second- or third trimester, we recommend anti-D immunoglobulin (rhogam) to prevent  rhesus isoimmunization. Evidence is scant whether one should give if rhogam should be given if loss of one twin occurs in the first trimester. It is  usually given within 72 hours or not later than 15 days. Recent antibody screen (11/22/19) is negative. Patient informed she would like to take rhogam. She received rhogam at our office.  Coarctation of aorta  I counseled the patient on the following (some comments are for her providers): -Maternal coarctation of aorta is classified under modified WHO (mWHO) class II-III risk score (expected major cardiac event of 10%-19%). -Recent data from Registry of Pregnancy and Cardiac Disease [facility]) has shown favorable outcomes in 303 patients with coarctation of aorta in prospectively enrolled cardiac patients (Pregnancy outcomes in women with aortic coarctation Ramlakhan et al. Heart 2021;107:290-298). -In women with no preexisting hypertension, repaired coarctation and in those who are not taking any medications (cardiac), hospitalizations were less common and outcomes favorable.  -No maternal mortality was seen. -Incidence of preeclampsia/gestational hypertension were lower than in women without coarctation. This could reflect early identification of hypertension and treatment in this group. -More patients with unrepaired coarctation had major cardiac events and were hospitalized. -Women with preexisting symptoms are more likely to be hospitalized. -In this study, major adverse cardiac event (MACE) was 4.3% (less than the predicted 10% to 19% event in patients with [facility] class II-III risk score.  Our patient does not have hypertension or severe symptoms including shortness of breath or chest pain. No aneurysm was identified (more common in patients with bicuspid aortic valve) on previous imaging studies. She also had repair of coarctation in the newborn period and, subsequently, balloon angioplasty procedure. She was not prescribed beta blockers by cardiologist and I defer this decision to her cardiologist.  I reassured her that vaginal delivery can be safely attempted and cesarean section would be  performed only for obstetric indications.  Overall, she is likely to have good pregnancy outcome with less-likelihood of a major adverse cardiac event. Pregnancy is associated with symptoms of increased shortness of breath (physiological) and her cardiac symptoms, if present, should be closely monitored.  The likelihood of congenital heart malformations in the fetus is slightly increased (5% vs. 1% in the general population). Fetal echocardiography at 20 weeks should be performed.  First pregnancy and African American ethnicity place her at a slightly high risk for preeclampsia. I did not discuss aspirin prophylaxis for prevention of preeclampsia today.  Recommendations: -An appointment was made for her to return in 2 weeks for first trimester ultrasound. -Disregard cell-free fetal DNA screening results. -Detailed fetal anatomy at 18 weeks to rule out anomalies. -Low-dose aspirin prophylaxis to be discussed with patient at her next prenatal/ultrasound visits. -I recommend low-dose aspirin 81 mg daily from 12 to 13 weeks till delivery (provided no contraindications exist). -Fetal echocardiography at 20 weeks. -Vaginal delivery.  Thank you for consultation. If you have any questions, please contact me at the [facility]. Consultation including face-to-face counseling: 45 minutes.

## 2019-12-05 ENCOUNTER — Encounter: Payer: Self-pay | Admitting: General Practice

## 2019-12-05 ENCOUNTER — Telehealth: Payer: Self-pay | Admitting: *Deleted

## 2019-12-05 NOTE — Telephone Encounter (Addendum)
Received a call from [employer] with abdominal Panorama results. Pateint's result showed an "Atypical Finding" which is suspected to be a donor twin pregnancy. Panorama is not indicated. Genetic counseling with ultrasound evaluation and diagnostic testing should be done. Please see scan report.  Clovis Pu, RN

## 2019-12-22 ENCOUNTER — Ambulatory Visit: Payer: BC Managed Care – PPO | Admitting: *Deleted

## 2019-12-22 ENCOUNTER — Other Ambulatory Visit: Payer: Self-pay | Admitting: *Deleted

## 2019-12-22 ENCOUNTER — Ambulatory Visit (HOSPITAL_COMMUNITY): Payer: BC Managed Care – PPO | Attending: Obstetrics and Gynecology

## 2019-12-22 ENCOUNTER — Other Ambulatory Visit: Payer: Self-pay

## 2019-12-22 DIAGNOSIS — O3111X0 Continuing pregnancy after spontaneous abortion of one fetus or more, first trimester, not applicable or unspecified: Secondary | ICD-10-CM

## 2019-12-22 DIAGNOSIS — O2691 Pregnancy related conditions, unspecified, first trimester: Secondary | ICD-10-CM

## 2019-12-22 DIAGNOSIS — O3120X0 Continuing pregnancy after intrauterine death of one fetus or more, unspecified trimester, not applicable or unspecified: Secondary | ICD-10-CM | POA: Diagnosis present

## 2019-12-22 DIAGNOSIS — O30041 Twin pregnancy, dichorionic/diamniotic, first trimester: Secondary | ICD-10-CM

## 2019-12-22 DIAGNOSIS — Z3400 Encounter for supervision of normal first pregnancy, unspecified trimester: Secondary | ICD-10-CM | POA: Insufficient documentation

## 2019-12-22 DIAGNOSIS — O26891 Other specified pregnancy related conditions, first trimester: Secondary | ICD-10-CM | POA: Diagnosis present

## 2019-12-22 DIAGNOSIS — Z6791 Unspecified blood type, Rh negative: Secondary | ICD-10-CM | POA: Insufficient documentation

## 2019-12-22 DIAGNOSIS — O30001 Twin pregnancy, unspecified number of placenta and unspecified number of amniotic sacs, first trimester: Secondary | ICD-10-CM

## 2019-12-22 DIAGNOSIS — Z363 Encounter for antenatal screening for malformations: Secondary | ICD-10-CM

## 2019-12-22 DIAGNOSIS — Z3A13 13 weeks gestation of pregnancy: Secondary | ICD-10-CM

## 2020-01-05 ENCOUNTER — Other Ambulatory Visit: Payer: Self-pay

## 2020-01-05 ENCOUNTER — Ambulatory Visit (INDEPENDENT_AMBULATORY_CARE_PROVIDER_SITE_OTHER): Payer: BC Managed Care – PPO | Admitting: Student

## 2020-01-05 VITALS — BP 123/80 | HR 82 | Temp 97.7°F | Wt 184.2 lb

## 2020-01-05 DIAGNOSIS — Z3A15 15 weeks gestation of pregnancy: Secondary | ICD-10-CM

## 2020-01-05 DIAGNOSIS — Q251 Coarctation of aorta: Secondary | ICD-10-CM

## 2020-01-05 DIAGNOSIS — O99112 Other diseases of the blood and blood-forming organs and certain disorders involving the immune mechanism complicating pregnancy, second trimester: Secondary | ICD-10-CM

## 2020-01-05 DIAGNOSIS — O30042 Twin pregnancy, dichorionic/diamniotic, second trimester: Secondary | ICD-10-CM

## 2020-01-05 DIAGNOSIS — Z3400 Encounter for supervision of normal first pregnancy, unspecified trimester: Secondary | ICD-10-CM

## 2020-01-05 NOTE — Patient Instructions (Signed)

## 2020-01-05 NOTE — Progress Notes (Signed)
   PRENATAL VISIT NOTE  Subjective:  Cassandra Hoover is a 34 y.o. G1P0 at [redacted]w[redacted]d being seen today for ongoing prenatal care.  She is currently monitored for the following issues for this high-risk pregnancy and has Coarctation of aorta; Aortic valve disorder; Supervision of normal first pregnancy, antepartum; Coarctation of aorta, congenital; Rh negative state in antepartum period, first trimester; and Dichorionic diamniotic twin pregnancy in first trimester on their problem list.  Patient reports no complaints.  Contractions: Not present. Vag. Bleeding: None.  Movement: Present. Denies leaking of fluid.  Reports increased nasal congestion, worse at night. Improved somewhat with breathe right strips.   The following portions of the patient's history were reviewed and updated as appropriate: allergies, current medications, past family history, past medical history, past social history, past surgical history and problem list.   Objective:   Vitals:   01/05/20 0808  BP: 123/80  Pulse: 82  Temp: 97.7 F (36.5 C)  Weight: 184 lb 3.2 oz (83.6 kg)    Fetal Status: Fetal Heart Rate (bpm): 140   Movement: Present     General:  Alert, oriented and cooperative. Patient is in no acute distress.  Skin: Skin is warm and dry. No rash noted.   Cardiovascular: Normal heart rate noted  Respiratory: Normal respiratory effort, no problems with respiration noted  Abdomen: Soft, gravid, appropriate for gestational age.  Pain/Pressure: Present     Pelvic: Cervical exam deferred        Extremities: Normal range of motion.  Edema: None  Mental Status: Normal mood and affect. Normal behavior. Normal judgment and thought content.   Assessment and Plan:  Pregnancy: G1P0 at [redacted]w[redacted]d 1. Supervision of normal first pregnancy, antepartum Doing well -Continue nasal strips at night. Can consider nasal steroid spray such as flonase. Avoid afrin.   2. Coarctation of aorta, congenital -per MFM, they will schedule her  for fetal echo at her anatomy u/s appointment  Preterm labor symptoms and general obstetric precautions including but not limited to vaginal bleeding, contractions, leaking of fluid and fetal movement were reviewed in detail with the patient. Please refer to After Visit Summary for other counseling recommendations.   Return in about 4 weeks (around 02/02/2020) for Routine OB.  Future Appointments  Date Time Provider Department Center  01/25/2020  7:45 AM [facility] NURSE [facility]  01/25/2020  7:45 AM [facility] US4 [facility]    Judeth Horn, NP

## 2020-01-24 ENCOUNTER — Encounter: Payer: Self-pay | Admitting: Student

## 2020-01-25 ENCOUNTER — Other Ambulatory Visit: Payer: Self-pay | Admitting: *Deleted

## 2020-01-25 ENCOUNTER — Ambulatory Visit: Payer: BC Managed Care – PPO | Attending: Obstetrics and Gynecology

## 2020-01-25 ENCOUNTER — Ambulatory Visit: Payer: BC Managed Care – PPO | Admitting: *Deleted

## 2020-01-25 ENCOUNTER — Other Ambulatory Visit: Payer: Self-pay

## 2020-01-25 VITALS — BP 126/79 | HR 85

## 2020-01-25 DIAGNOSIS — Z6791 Unspecified blood type, Rh negative: Secondary | ICD-10-CM

## 2020-01-25 DIAGNOSIS — O26891 Other specified pregnancy related conditions, first trimester: Secondary | ICD-10-CM | POA: Insufficient documentation

## 2020-01-25 DIAGNOSIS — O0990 Supervision of high risk pregnancy, unspecified, unspecified trimester: Secondary | ICD-10-CM | POA: Insufficient documentation

## 2020-01-25 DIAGNOSIS — Z3400 Encounter for supervision of normal first pregnancy, unspecified trimester: Secondary | ICD-10-CM | POA: Diagnosis present

## 2020-01-25 DIAGNOSIS — Z363 Encounter for antenatal screening for malformations: Secondary | ICD-10-CM | POA: Diagnosis present

## 2020-01-25 DIAGNOSIS — O2692 Pregnancy related conditions, unspecified, second trimester: Secondary | ICD-10-CM | POA: Diagnosis not present

## 2020-01-25 DIAGNOSIS — O30042 Twin pregnancy, dichorionic/diamniotic, second trimester: Secondary | ICD-10-CM

## 2020-01-25 DIAGNOSIS — Z362 Encounter for other antenatal screening follow-up: Secondary | ICD-10-CM

## 2020-01-25 DIAGNOSIS — Z3A18 18 weeks gestation of pregnancy: Secondary | ICD-10-CM | POA: Diagnosis not present

## 2020-01-26 ENCOUNTER — Ambulatory Visit (INDEPENDENT_AMBULATORY_CARE_PROVIDER_SITE_OTHER): Payer: BC Managed Care – PPO

## 2020-01-26 ENCOUNTER — Other Ambulatory Visit (HOSPITAL_COMMUNITY)
Admission: RE | Admit: 2020-01-26 | Discharge: 2020-01-26 | Disposition: A | Discharge status: Home / Self Care | Payer: BC Managed Care – PPO | Source: Ambulatory Visit

## 2020-01-26 VITALS — BP 134/76 | HR 94 | Temp 98.2°F | Wt 187.0 lb

## 2020-01-26 DIAGNOSIS — O26892 Other specified pregnancy related conditions, second trimester: Secondary | ICD-10-CM | POA: Insufficient documentation

## 2020-01-26 DIAGNOSIS — N898 Other specified noninflammatory disorders of vagina: Secondary | ICD-10-CM

## 2020-01-26 DIAGNOSIS — O26899 Other specified pregnancy related conditions, unspecified trimester: Secondary | ICD-10-CM

## 2020-01-26 DIAGNOSIS — Z3A18 18 weeks gestation of pregnancy: Secondary | ICD-10-CM | POA: Diagnosis not present

## 2020-01-26 DIAGNOSIS — Z3400 Encounter for supervision of normal first pregnancy, unspecified trimester: Secondary | ICD-10-CM

## 2020-01-26 MED ORDER — TERCONAZOLE 0.4 % VA CREA
1.0000 | TOPICAL_CREAM | Freq: Every day | VAGINAL | 0 refills | Status: DC
Start: 2020-01-26 — End: 2020-03-22

## 2020-01-26 NOTE — Patient Instructions (Signed)
Vaginal Yeast Infection, Adult  Vaginal yeast infection is a condition that causes vaginal discharge as well as soreness, swelling, and redness (inflammation) of the vagina. This is a common condition. Some women get this infection frequently. What are the causes? This condition is caused by a change in the normal balance of the yeast (candida) and bacteria that live in the vagina. This change causes an overgrowth of yeast, which causes the inflammation. What increases the risk? The condition is more likely to develop in women who:  Take antibiotic medicines.  Have diabetes.  Take birth control pills.  Are pregnant.  Douche often.  Have a weak body defense system (immune system).  Have been taking steroid medicines for a long time.  Frequently wear tight clothing. What are the signs or symptoms? Symptoms of this condition include:  White, thick, creamy vaginal discharge.  Swelling, itching, redness, and irritation of the vagina. The lips of the vagina (vulva) may be affected as well.  Pain or a burning feeling while urinating.  Pain during sex. How is this diagnosed? This condition is diagnosed based on:  Your medical history.  A physical exam.  A pelvic exam. Your health care provider will examine a sample of your vaginal discharge under a microscope. Your health care provider may send this sample for testing to confirm the diagnosis. How is this treated? This condition is treated with medicine. Medicines may be over-the-counter or prescription. You may be told to use one or more of the following:  Medicine that is taken by mouth (orally).  Medicine that is applied as a cream (topically).  Medicine that is inserted directly into the vagina (suppository). Follow these instructions at home:  Lifestyle  Do not have sex until your health care provider approves. Tell your sex partner that you have a yeast infection. That person should go to his or her health care  provider and ask if they should also be treated.  Do not wear tight clothes, such as pantyhose or tight pants.  Wear breathable cotton underwear. General instructions  Take or apply over-the-counter and prescription medicines only as told by your health care provider.  Eat more yogurt. This may help to keep your yeast infection from returning.  Do not use tampons until your health care provider approves.  Try taking a sitz bath to help with discomfort. This is a warm water bath that is taken while you are sitting down. The water should only come up to your hips and should cover your buttocks. Do this 3-4 times per day or as told by your health care provider.  Do not douche.  If you have diabetes, keep your blood sugar levels under control.  Keep all follow-up visits as told by your health care provider. This is important. Contact a health care provider if:  You have a fever.  Your symptoms go away and then return.  Your symptoms do not get better with treatment.  Your symptoms get worse.  You have new symptoms.  You develop blisters in or around your vagina.  You have blood coming from your vagina and it is not your menstrual period.  You develop pain in your abdomen. Summary  Vaginal yeast infection is a condition that causes discharge as well as soreness, swelling, and redness (inflammation) of the vagina.  This condition is treated with medicine. Medicines may be over-the-counter or prescription.  Take or apply over-the-counter and prescription medicines only as told by your health care provider.  Do not douche.   Do not have sex or use tampons until your health care provider approves.  Contact a health care provider if your symptoms do not get better with treatment or your symptoms go away and then return. This information is not intended to replace advice given to you by your health care provider. Make sure you discuss any questions you have with your health care  provider. Document Revised: 02/24/2019 Document Reviewed: 12/13/2017 Elsevier Patient Education  2020 Elsevier Inc.  

## 2020-01-26 NOTE — Progress Notes (Signed)
° °  LOW-RISK PREGNANCY OFFICE VISIT  Patient name: Cassandra Hoover MRN [id number]  Date of birth: Jun 06, 1986 Chief Complaint:   Routine Prenatal Visit  Subjective:   [name] is a 34 y.o. G1P0 female at [redacted]w[redacted]d with an Estimated Date of Delivery: 06/24/20 being seen today for ongoing management of a low-risk pregnancy.  Today she reports vaginal irritation. She states in the past week she has noted a change in vaginal discharge from a "normal color to a light green color."  Patient reports she had ongoing itching that she has been treating with a vagisil cream.  Patient denies problems with urination or pain or discomfort during sexual activity. No vaginal bleeding.   Contractions: Not present. Vag. Bleeding: None.  Movement: Present.  Reviewed past medical,surgical, social, obstetrical and family history as well as problem list, medications and allergies.  Objective   Vitals:   01/26/20 0820  BP: 134/76  Pulse: 94  Temp: 98.2 F (36.8 C)  Weight: 187 lb (84.8 kg)  Body mass index is 29.29 kg/m.        Physical Examination:   General appearance: Well appearing, and in no distress  Mental status: Alert, oriented to person, place, and time  Skin: Warm & dry  Cardiovascular: Normal heart rate noted  Respiratory: Normal respiratory effort, no distress  Abdomen: Soft, gravid, nontender, AGA with Fundal height of Fundal Height: 18 cm  Pelvic: Cervical exam performed  Dilation: Closed Effacement (%): 0 Station: Ballotable    Extremities: Edema: None  Fetal Status: Fetal Heart Rate (bpm): 143  Movement: Present   No results found for this or any previous visit (from the past 24 hour(s)).  Assessment & Plan:  Low-risk pregnancy of a 34 y.o., G1P0 at [redacted]w[redacted]d with an Estimated Date of Delivery: 06/24/20   1. Vaginal discharge during pregnancy, antepartum -CV collected -Exam suspicious for yeast infection. -Rx for Terazol cream sent to pharmacy on file.   2. Supervision of normal first  pregnancy, antepartum -[place] results reviewed. -Will perform AFP today. -Reviewed normal pregnancy complaints.  -Discussed when and where to seek care for emergent pregnancy complaints.     Meds:  Meds ordered this encounter  Medications   terconazole (TERAZOL 7) 0.4 % vaginal cream    Sig: Place 1 applicator vaginally at bedtime.    Dispense:  45 g    Refill:  0    Order Specific Question:   Supervising Provider    Answer:   Reva Bores [[id number]]   Labs/procedures today:   Lab Orders     AFP, Serum, Open Spina Bifida   Reviewed: Preterm labor symptoms and general obstetric precautions including but not limited to vaginal bleeding, contractions, leaking of fluid and fetal movement were reviewed in detail with the patient.  All questions were answered.  Follow-up: Return in about 4 weeks (around 02/23/2020) for Virtual LR-ROB.  Orders Placed This Encounter  Procedures   AFP, Serum, Open Spina Bifida   [name] MSN, CNM 01/26/2020

## 2020-01-28 LAB — AFP, SERUM, OPEN SPINA BIFIDA
AFP MoM: 1.34
AFP Value: 57.3 ng/mL
Gest. Age on Collection Date: 18 weeks
Maternal Age At EDD: 32.8 yr
OSBR Risk 1 IN: 8546
Test Results:: NEGATIVE
Weight: 187 [lb_av]

## 2020-01-29 LAB — CERVICOVAGINAL ANCILLARY ONLY
Bacterial Vaginitis (gardnerella): NEGATIVE
Candida Glabrata: NEGATIVE
Candida Vaginitis: POSITIVE — AB
Chlamydia: NEGATIVE
Comment: NEGATIVE
Comment: NEGATIVE
Comment: NEGATIVE
Comment: NEGATIVE
Comment: NEGATIVE
Comment: NORMAL
Neisseria Gonorrhea: NEGATIVE
Trichomonas: NEGATIVE

## 2020-01-31 ENCOUNTER — Encounter: Payer: BC Managed Care – PPO | Admitting: Advanced Practice Midwife

## 2020-02-21 ENCOUNTER — Encounter: Payer: Self-pay | Admitting: Student

## 2020-02-23 ENCOUNTER — Other Ambulatory Visit: Payer: Self-pay | Admitting: *Deleted

## 2020-02-23 ENCOUNTER — Ambulatory Visit: Payer: BC Managed Care – PPO | Attending: Obstetrics

## 2020-02-23 ENCOUNTER — Ambulatory Visit (INDEPENDENT_AMBULATORY_CARE_PROVIDER_SITE_OTHER): Payer: 59 | Admitting: Certified Nurse Midwife

## 2020-02-23 ENCOUNTER — Ambulatory Visit: Payer: BC Managed Care – PPO | Admitting: *Deleted

## 2020-02-23 ENCOUNTER — Other Ambulatory Visit: Payer: Self-pay

## 2020-02-23 VITALS — BP 124/81 | HR 90

## 2020-02-23 VITALS — BP 128/77 | HR 92 | Temp 97.3°F | Wt 188.6 lb

## 2020-02-23 DIAGNOSIS — O26891 Other specified pregnancy related conditions, first trimester: Secondary | ICD-10-CM | POA: Diagnosis present

## 2020-02-23 DIAGNOSIS — Z3400 Encounter for supervision of normal first pregnancy, unspecified trimester: Secondary | ICD-10-CM

## 2020-02-23 DIAGNOSIS — O3112X0 Continuing pregnancy after spontaneous abortion of one fetus or more, second trimester, not applicable or unspecified: Secondary | ICD-10-CM | POA: Diagnosis not present

## 2020-02-23 DIAGNOSIS — O30002 Twin pregnancy, unspecified number of placenta and unspecified number of amniotic sacs, second trimester: Secondary | ICD-10-CM | POA: Diagnosis not present

## 2020-02-23 DIAGNOSIS — Z363 Encounter for antenatal screening for malformations: Secondary | ICD-10-CM

## 2020-02-23 DIAGNOSIS — Z362 Encounter for other antenatal screening follow-up: Secondary | ICD-10-CM | POA: Insufficient documentation

## 2020-02-23 DIAGNOSIS — Z3A22 22 weeks gestation of pregnancy: Secondary | ICD-10-CM

## 2020-02-23 DIAGNOSIS — O2692 Pregnancy related conditions, unspecified, second trimester: Secondary | ICD-10-CM

## 2020-02-23 DIAGNOSIS — Z6791 Unspecified blood type, Rh negative: Secondary | ICD-10-CM | POA: Insufficient documentation

## 2020-02-23 DIAGNOSIS — O0990 Supervision of high risk pregnancy, unspecified, unspecified trimester: Secondary | ICD-10-CM | POA: Diagnosis present

## 2020-02-23 DIAGNOSIS — O360920 Maternal care for other rhesus isoimmunization, second trimester, not applicable or unspecified: Secondary | ICD-10-CM

## 2020-02-23 MED ORDER — ASPIRIN 81 MG PO CHEW: 81.0000 mg | CHEWABLE_TABLET | Freq: Every day | ORAL | Status: DC

## 2020-02-23 NOTE — Patient Instructions (Addendum)
Chiropractor: Daron Offer with [facility] at [email or web address]  [facility] [phone] [email or web address] Allene Pyo with [facility] [phone] [email or web address]  Please start taking 1000-2000IU/day of additional vitamin D, as further protection against preeclampsia.   Second Trimester of Pregnancy  The second trimester is from week 14 through week 27 (month 4 through 6). This is often the time in pregnancy that you feel your best. Often times, morning sickness has lessened or quit. You may have more energy, and you may get hungry more often. Your unborn baby is growing rapidly. At the end of the sixth month, he or she is about 9 inches long and weighs about 1 pounds. You will likely feel the baby move between 18 and 20 weeks of pregnancy. Follow these instructions at home: Medicines  Take over-the-counter and prescription medicines only as told by your doctor. Some medicines are safe and some medicines are not safe during pregnancy.  Take a prenatal vitamin that contains at least 600 micrograms (mcg) of folic acid.  If you have trouble pooping (constipation), take medicine that will make your stool soft (stool softener) if your doctor approves. Eating and drinking   Eat regular, healthy meals.  Avoid raw meat and uncooked cheese.  If you get low calcium from the food you eat, talk to your doctor about taking a daily calcium supplement.  Avoid foods that are high in fat and sugars, such as fried and sweet foods.  If you feel sick to your stomach (nauseous) or throw up (vomit): ? Eat 4 or 5 small meals a day instead of 3 large meals. ? Try eating a few soda crackers. ? Drink liquids between meals instead of during meals.  To prevent constipation: ? Eat foods that are high in fiber, like fresh fruits and vegetables, whole grains, and beans. ? Drink  enough fluids to keep your pee (urine) clear or pale yellow. Activity  Exercise only as told by your doctor. Stop exercising if you start to have cramps.  Do not exercise if it is too hot, too humid, or if you are in a place of great height (high altitude).  Avoid heavy lifting.  Wear low-heeled shoes. Sit and stand up straight.  You can continue to have sex unless your doctor tells you not to. Relieving pain and discomfort  Wear a good support bra if your breasts are tender.  Take warm water baths (sitz baths) to soothe pain or discomfort caused by hemorrhoids. Use hemorrhoid cream if your doctor approves.  Rest with your legs raised if you have leg cramps or low back pain.  If you develop puffy, bulging veins (varicose veins) in your legs: ? Wear support hose or compression stockings as told by your doctor. ? Raise (elevate) your feet for 15 minutes, 3-4 times a day. ? Limit salt in your food. Prenatal care  Write down your questions. Take them to your prenatal visits.  Keep all your prenatal visits as told by your doctor. This is important. Safety  Wear your seat belt when driving.  Make a list of emergency phone numbers, including numbers for family, friends, the hospital, and police and fire departments. General instructions  Ask your doctor about the right foods to eat or for help finding a counselor, if you need these services.  Ask your doctor about local prenatal classes. Begin classes before month 6 of your pregnancy.  Do not use hot tubs, steam rooms, or saunas.  Do not douche or use tampons or scented sanitary pads.  Do not cross your legs for long periods of time.  Visit your dentist if you have not done so. Use a soft toothbrush to brush your teeth. Floss gently.  Avoid all smoking, herbs, and alcohol. Avoid drugs that are not approved by your doctor.  Do not use any products that contain nicotine or tobacco, such as cigarettes and e-cigarettes. If you  need help quitting, ask your doctor.  Avoid cat litter boxes and soil used by cats. These carry germs that can cause birth defects in the baby and can cause a loss of your baby (miscarriage) or stillbirth. Contact a doctor if:  You have mild cramps or pressure in your lower belly.  You have pain when you pee (urinate).  You have bad smelling fluid coming from your vagina.  You continue to feel sick to your stomach (nauseous), throw up (vomit), or have watery poop (diarrhea).  You have a nagging pain in your belly area.  You feel dizzy. Get help right away if:  You have a fever.  You are leaking fluid from your vagina.  You have spotting or bleeding from your vagina.  You have severe belly cramping or pain.  You lose or gain weight rapidly.  You have trouble catching your breath and have chest pain.  You notice sudden or extreme puffiness (swelling) of your face, hands, ankles, feet, or legs.  You have not felt the baby move in over an hour.  You have severe headaches that do not go away when you take medicine.  You have trouble seeing. Summary  The second trimester is from week 14 through week 27 (months 4 through 6). This is often the time in pregnancy that you feel your best.  To take care of yourself and your unborn baby, you will need to eat healthy meals, take medicines only if your doctor tells you to do so, and do activities that are safe for you and your baby.  Call your doctor if you get sick or if you notice anything unusual about your pregnancy. Also, call your doctor if you need help with the right food to eat, or if you want to know what activities are safe for you. This information is not intended to replace advice given to you by your health care provider. Make sure you discuss any questions you have with your health care provider. Document Revised: 11/18/2018 Document Reviewed: 09/01/2016 Elsevier Patient Education  2020 [employer].

## 2020-02-23 NOTE — Progress Notes (Signed)
   PRENATAL VISIT NOTE  Subjective:  Cassandra Hoover is a 34 y.o. G1P0 at [redacted]w[redacted]d being seen today for ongoing prenatal care.  She is currently monitored for the following issues for this low-risk pregnancy and has Coarctation of aorta; Aortic valve disorder; Supervision of normal first pregnancy, antepartum; Coarctation of aorta, congenital; Rh negative state in antepartum period, first trimester; and Dichorionic diamniotic twin pregnancy in first trimester on their problem list.  Patient reports no complaints and had questions about another rhogam dose. Has attended her waterbirth class. Having some right hip pain/instability.  Contractions: Not present. Vag. Bleeding: None.  Movement: Present. Denies leaking of fluid.   The following portions of the patient's history were reviewed and updated as appropriate: allergies, current medications, past family history, past medical history, past social history, past surgical history and problem list.   Objective:   Vitals:   02/23/20 0922  BP: 128/77  Pulse: 92  Temp: (!) 97.3 F (36.3 C)  Weight: 188 lb 9.6 oz (85.5 kg)    Fetal Status: Fetal Heart Rate (bpm): 144 Fundal Height: 23 cm Movement: Present     General:  Alert, oriented and cooperative. Patient is in no acute distress.  Skin: Skin is warm and dry. No rash noted.   Cardiovascular: Normal heart rate noted  Respiratory: Normal respiratory effort, no problems with respiration noted  Abdomen: Soft, gravid, appropriate for gestational age.  Pain/Pressure: Absent     Pelvic: Cervical exam deferred        Extremities: Normal range of motion.  Edema: None  Mental Status: Normal mood and affect. Normal behavior. Normal judgment and thought content.   Assessment and Plan:  Pregnancy: G1P0 at [redacted]w[redacted]d 1. Supervision of normal first pregnancy, antepartum - Next rhophylac dose at 30wks - Recommended daily baby aspirin (per initial MFM note) and vitamin D supplementation  - Pt to bring  waterbirth certificate to next visit  - Gave chiropractic, doula, and childbirth class recommendations - Discussed daily stretches for pelvis/baby alignment  Preterm labor symptoms and general obstetric precautions including but not limited to vaginal bleeding, contractions, leaking of fluid and fetal movement were reviewed in detail with the patient. Please refer to After Visit Summary for other counseling recommendations.   Return in about 4 weeks (around 03/22/2020) for LOB.  Future Appointments  Date Time Provider Department Center  03/22/2020  9:30 AM Gerrit Heck, CNM [facility] None  04/05/2020  8:10 AM Gerrit Heck, CNM [facility] None  04/19/2020  9:15 AM [facility] US2 [facility]    Bernerd Limbo, CNM

## 2020-03-12 ENCOUNTER — Encounter: Payer: Self-pay | Admitting: Student

## 2020-03-22 ENCOUNTER — Other Ambulatory Visit (HOSPITAL_COMMUNITY)
Admission: RE | Admit: 2020-03-22 | Discharge: 2020-03-22 | Disposition: A | Discharge status: Home / Self Care | Payer: BC Managed Care – PPO | Source: Ambulatory Visit

## 2020-03-22 ENCOUNTER — Other Ambulatory Visit: Payer: Self-pay

## 2020-03-22 ENCOUNTER — Ambulatory Visit (INDEPENDENT_AMBULATORY_CARE_PROVIDER_SITE_OTHER): Payer: 59 | Admitting: Certified Nurse Midwife

## 2020-03-22 VITALS — BP 120/80 | HR 93 | Wt 191.6 lb

## 2020-03-22 DIAGNOSIS — Z3402 Encounter for supervision of normal first pregnancy, second trimester: Secondary | ICD-10-CM | POA: Diagnosis present

## 2020-03-22 DIAGNOSIS — Z3400 Encounter for supervision of normal first pregnancy, unspecified trimester: Secondary | ICD-10-CM

## 2020-03-22 DIAGNOSIS — Z3A26 26 weeks gestation of pregnancy: Secondary | ICD-10-CM

## 2020-03-22 MED ORDER — MAG-OXIDE 200 MG PO TABS
400.0000 mg | ORAL_TABLET | Freq: Every day | ORAL | 3 refills | Status: DC
Start: 2020-03-22 — End: 2023-08-07

## 2020-03-22 MED ORDER — MICONAZOLE NITRATE 2 % VA CREA: 1.0000 | TOPICAL_CREAM | Freq: Every day | VAGINAL | 2 refills | Status: DC

## 2020-03-22 NOTE — Progress Notes (Signed)
[id number] hour gtt schedule

## 2020-03-22 NOTE — Progress Notes (Signed)
   PRENATAL VISIT NOTE  Subjective:  Cassandra Hoover is a 34 y.o. G1P0 at [redacted]w[redacted]d being seen today for ongoing prenatal care.  She is currently monitored for the following issues for this low-risk pregnancy and has Coarctation of aorta; Aortic valve disorder; Supervision of normal first pregnancy, antepartum; Coarctation of aorta, congenital; Rh negative state in antepartum period, first trimester; and Dichorionic diamniotic twin pregnancy in first trimester on their problem list.  Patient reports headache, vaginal irritation and copious white vaginal discharge, suspects yeast. Also noticing the occasional headache and trouble focusing vision, both go away when she is better hydrated.  Contractions: Not present. Vag. Bleeding: Other.  Movement: Present. Denies leaking of fluid.   The following portions of the patient's history were reviewed and updated as appropriate: allergies, current medications, past family history, past medical history, past social history, past surgical history and problem list.   Objective:   Vitals:   03/22/20 0935  BP: 120/80  Pulse: 93  Weight: 191 lb 9.6 oz (86.9 kg)    Fetal Status: Fetal Heart Rate (bpm): 141 Fundal Height: 26 cm Movement: Present     General:  Alert, oriented and cooperative. Patient is in no acute distress.  Skin: Skin is warm and dry. No rash noted.   Cardiovascular: Normal heart rate noted  Respiratory: Normal respiratory effort, no problems with respiration noted  Abdomen: Soft, gravid, appropriate for gestational age.  Pain/Pressure: Absent     Pelvic: Thick white chunky discharge apparent on walls of vagina and cervix, cervix irritated and scant spotting noted after swab, cervix visually closed        Extremities: Normal range of motion.     Mental Status: Normal mood and affect. Normal behavior. Normal judgment and thought content.   Assessment and Plan:  Pregnancy: G1P0 at [redacted]w[redacted]d 1. Supervision of normal first pregnancy  2. [redacted] weeks  gestation of pregnancy - Anticipatory guidance given about next visit (GTT) - pt has a phobia of needles, BP should be taken after visit - Chiro recommended magnesium, confirmed and recommended 400mg  at bedtime, script sent - Cervicovaginal ancillary only( [place]) - Miconazole cream script sent to pharmacy - Discussed appropriate hydration and nutrition, recommended she see her eye doctor as vision can change in pregnancy and blood pressure has been appropriate.   Preterm labor symptoms and general obstetric precautions including but not limited to vaginal bleeding, contractions, leaking of fluid and fetal movement were reviewed in detail with the patient. Please refer to After Visit Summary for other counseling recommendations.   Future Appointments  Date Time Provider Department Center  04/05/2020  8:10 AM 04/07/2020, CNM [facility] None  04/19/2020  9:00 AM [facility] NURSE [facility]  04/19/2020  9:15 AM [facility] US2 [facility]  05/03/2020 11:10 AM 05/05/2020, CNM [facility] None  05/31/2020 10:30 AM 06/02/2020, CNM [facility] None   Gerrit Heck, CNM, MSN, [facility] 03/22/20 10:35 AM

## 2020-03-25 LAB — CERVICOVAGINAL ANCILLARY ONLY
Bacterial Vaginitis (gardnerella): NEGATIVE
Candida Glabrata: NEGATIVE
Candida Vaginitis: NEGATIVE
Comment: NEGATIVE
Comment: NEGATIVE
Comment: NEGATIVE

## 2020-03-27 ENCOUNTER — Encounter: Payer: Self-pay | Admitting: General Practice

## 2020-04-05 ENCOUNTER — Other Ambulatory Visit: Payer: Self-pay

## 2020-04-05 ENCOUNTER — Ambulatory Visit (INDEPENDENT_AMBULATORY_CARE_PROVIDER_SITE_OTHER): Payer: BC Managed Care – PPO

## 2020-04-05 ENCOUNTER — Encounter: Payer: Self-pay | Admitting: General Practice

## 2020-04-05 DIAGNOSIS — R040 Epistaxis: Secondary | ICD-10-CM

## 2020-04-05 DIAGNOSIS — Z3400 Encounter for supervision of normal first pregnancy, unspecified trimester: Secondary | ICD-10-CM

## 2020-04-05 DIAGNOSIS — Z23 Encounter for immunization: Secondary | ICD-10-CM | POA: Diagnosis not present

## 2020-04-05 DIAGNOSIS — Z3A28 28 weeks gestation of pregnancy: Secondary | ICD-10-CM

## 2020-04-05 NOTE — Patient Instructions (Signed)
AREA PEDIATRIC/FAMILY PRACTICE PHYSICIANS  [place] ([address]) . [facility] o Chambliss, MD; Eniola, MD; Hale, MD; Hensel, MD; McDiarmid, MD; McIntyer, MD; Neal, MD; Walden, MD o [address] [place], [place] [address] o [phone] o Mon-Fri 8:30-12:30, 1:30-5:00 o Providers come to see babies at [facility] o Accepting Medicaid . [facility] o Limited providers who accept newborns: Koirala, MD; Morrow, MD; Wolters, MD o [address], [place], [place] [address] o [phone] o Mon-Fri 8:00-5:30 o Babies seen by providers at [facility] o Does NOT accept Medicaid o Please call early in hospitalization for appointment (limited availability)  . [facility] o Mulberry, MD o [address] [place], [place] [address] o [phone] o Mon, Tue, Thur, Fri 8:30-5:00, Wed 10:00-7:00 (closed 1-2pm) o Babies seen by [facility] providers o Accepting Medicaid . [facility] o Rubin, MD o [address], [place], [place] [address] o [phone] o Mon-Fri 8:30-5:00, Sat 8:30-12:00 o Provider comes to see babies at [facility] o Accepting Medicaid o Must have been referred from current patients or contacted office prior to delivery . [facility] ([facility]) o Brown, MD; Chandler, MD; Ettefagh, MD; Grant, MD; Lester, MD; McCormick, MD; McQueen, MD; Prose, MD; Simha, MD; Stanley, MD; Stryffeler, NP; Tebben, NP o [address], [place], [place] [address] o [phone] o Mon, Tue, Thur, Fri 8:30-5:30, Wed 9:30-5:30, Sat 8:30-12:30 o Babies seen by [facility] providers o Accepting Medicaid o Only accepting infants of first-time parents or siblings of current patients o Hospital discharge coordinator will make follow-up appointment . Jack Amos o [address],  [place], [place]  [address] o [phone]   Fax - [phone] . [facility] o [address], [place], [place]  [address] o Phone - [phone]   Fax - [phone] . Shilpa Gosrani o [address], [place], [place]  [address] o [phone]  [place] ([address]) . [facility] o Bates, MD; [place]field, MD; Cooper, Cox, MD; MD; Davis, MD; Dovico, MD; Ettefaugh, MD; Little, MD; Lowe, MD; Keiffer, MD; Melvin, MD; Sumner, MD; Williams, MD o [address], [place], [place] [address] o [phone] o Mon-Fri 8:30-5:00 (extended evenings Mon-Thur as needed), Sat-Sun 10:00-1:00 o Providers come to see babies at [facility] o Accepting Medicaid for families of first-time babies and families with all children in the household age 3 and under. Must register with office prior to making appointment (M-F only). . [facility] o Henson, NP; Knapp, MD; Lalonde, MD; Tysinger, PA o [address] [place], [place] [address] o [phone] o Mon-Fri 8:00-5:00 o Babies seen by providers at [facility] o Does NOT accept Medicaid/Commercial Insurance Only . [facility] ([facility])  o Artis, MD; Barnes, MD; Bratton, MD; Coccaro, MD; Lockett Gardner, MD; Kramer, MD; Marshall, MD; Netherton, MD; Poleto, MD; Skinner, MD o [address] [place], [place] [address] o [phone] o Mon-Fri 8:30-5:30, Sat (Oct.-Mar.) 9:00-1:00 o Babies seen by providers at [facility] o Accepting Medicaid  [place] ([address]) . [facility]  o Reid, MD; Warner, MD o [address], , [place] [address] o [phone] o Mon-Fri 8:30-5:00, Sat 8:30-12:00 o Providers come to see babies at [facility] o Does NOT accept Medicaid . [facility] o Becker, PA; Hagler, MD; Scifres, PA; Sun, MD; Swayne, MD o [address],  [place], [place] [address] o [phone] o Mon-Fri 8:00-5:00 o Babies seen by providers at [facility] o Does NOT accept Medicaid o Only accepting babies of parents who are patients o Please call early in hospitalization for appointment (limited availability) . [facility] o Clark, MD; Frye, MD; Kelleher, MD; Mack, NP; Miller, MD; O'Keller, MD; Patterson, NP; Pudlo, MD; Puzio, MD; Thomas, MD; Tucker, MD; Twiselton, MD o [address], [place], [place] [address] o [phone] o Mon-Fri 8:00-5:00, Sat 9:00-12:00 o Providers come to see babies at [facility] o Does NOT accept Medicaid  [place] ([address]) . [facility] o Limited providers accepting new patients: Brake, NP; Wharton, PA o [address], [place], [place] [address] o [phone] o Mon-Fri 8:00-5:00 o Babies seen by providers at [facility] o Does NOT accept Medicaid o Only accepting babies of parents who are patients o Please call early in hospitalization for appointment (limited availability) . [facility] o Gay, MD; Quinlan, MD o [address] [place], [place] [address] o [phone] (press 1 to schedule appointment) o Mon-Fri 8:00-5:00 o Providers come to see babies at [facility] o Does NOT accept Medicaid . [facility] o Mazer, MD o [address] [place], [place] [address] o [phone] o Mon-Fri 8:30-5:00 (lunch 12:30-1:00), extended hours by appointment only Wed 5:00-6:30 o Babies seen by [facility] providers o Accepting Medicaid . [facility] o Banks, MD; Jordan, MD; Koberlein, MD o [address], [place], [place] [address] o [phone] o Mon-Fri 8:00-5:00 o Babies seen by [facility] providers o Does NOT accept Medicaid . [facility] o Parker, MD; Hunter, MD; Wallace, DO o [address] [place], [place]  [address] o [phone] o Mon-Fri 8:00-5:00 o Babies seen by [facility] providers o Does NOT accept Medicaid . [facility] o Brandon, PA; Brecken, PA; Christy, NP; Dees, MD; DeClaire, MD; DeWeese, MD; Hansen, NP; Mills, NP; Parrish, NP; Smoot, NP; Summer, MD; Vapne, MD o [address] [place], [place] [address] o [phone] o Mon-Fri 8:30-5:00, Sat 10:00-1:00 o Providers come to see babies at [facility] o Does NOT accept Medicaid o Free prenatal information session Tuesdays at 4:45pm . [facility] o Bouska, MD; Gordon, PA; Jeffery, PA; Weber, PA o [address] [place] [address] o [phone] o Mon-Fri 7:30-5:30 o Babies seen by [facility] providers . [facility] o [address], [place], [place]  [address] o [phone]   Fax - [phone]  [place] ([address]) . [facility] o [place], [place] o [address] [place], [place] [address] o [phone] o Mon-Thur 8:00-6:00 o Providers come to see babies at [facility] o Accepting Medicaid . [facility] o Anderson, NP; Badger, MD; Beal, PA; Spencer, PA o [address] [place], [place] [address] o [phone] o Mon-Thur 7:30-7:30, Fri 7:30-4:30 o Babies seen by [facility] providers o Accepting Medicaid . [facility] o Agbuya, MD; Klett, NP; Romgoolam, MD o [address], [place], [place] [address] o [phone] o Mon-Fri 8:30-5:00, Sat 8:30-12:00 o Providers come to see babies at [facility] o Accepting Medicaid o Must have "Meet & Greet" appointment at office prior to delivery . [facility] o McCord,   MD; Wallace, MD; Wood, MD o [address], [place], [place] [address] o [phone] o Mon-Wed 8:00-6:00, Thur-Fri 8:00-5:00, Sat 9:00-12:00 o Providers come to  see babies at [facility] o Does NOT accept Medicaid o Only accepting siblings of current patients . [facility]  o [address], [place], [place]  [address] o [phone]   Fax - [phone] . [facility] o [address], [place], [place]  [address] o [phone]   Fax - [phone]  [place] ([address] & [address]) . [facility] o Cirigliano, DO; Matthews, DO o [address] [place], [place] [address] o [phone] o Mon-Fri 7:00-5:00 o Babies seen by [facility] providers o Does NOT accept Medicaid . [facility] o Briscoe, MD; Howley, PA; Moreira, PA o [address], [place], [place] [address] o [phone] o Mon-Fri 8:00-5:00 o Babies seen by [facility] providers o Accepting Medicaid . [facility] o Boyd, MD; Church, PA; Jones, NP; Osborn, PA o [address], [place], [place] [address] o [phone] o Mon-Fri 8:00-5:00 o Babies seen by providers at [facility] o Accepting Medicaid  [place] ([address]) . [facility] [place] o Wendling, DO o [address] [place], [place] [address] o [phone] o Mon-Fri 8:00-5:00 o Babies seen by [facility] providers o Does NOT accept Medicaid o Limited availability, please call early in hospitalization to schedule follow-up . [facility] o Calderon, PA; Cummings, MD; Dillard, MD; Martin, PA; Olson, MD; VanDeven, PA o [address], [place], [place] [address] o [phone] o Mon-Fri 8:30-5:00, Sat 9:00-12:00 o Babies seen by providers at [facility] o Accepting Medicaid o Please register online then schedule online or call office o [email or web address] . [facility] o Hunter, NP; Kumar, MD; Martin Rogers, PA o [address], [place], [place] [address] o [phone] o Mon-Fri 8:00-5:00 o Babies seen by providers at [facility] o Accepting Medicaid . [facility] ([facility] o Brush Creek, MD; Kristi Fleenor, NP; West, MD o [address], [place], [place] [address] o [phone] o Mon-Fri 8:00-5:30, Sat&Sun by appointment (phones open at 8:30) o Babies seen by [facility] providers o Accepting Medicaid o Must be a first-time baby or sibling of current patient . [facility] [place]  o [address], [place], [place]  [address] o [phone]   Fax - [phone]  [place] ([address]) . [facility] o Brown, PA; Cowen, PA; Rice, MD; Helton, PA; Spry, MD o [address] [place], [place] [address] o [phone] o Mon-Thur 8:00-7:00, Fri 8:00-5:00, Sat 8:00-12:00, Sun 9:00-12:00 o Babies seen by [facility] providers o Accepting Medicaid . [facility] [place] o Coe-Goins, MD; Marshall, MD; Pierre-Louis, MD o [address] [place], [place], [place] [address] o [phone] o Mon-Thur 8:00-5:00 o Babies seen by providers at [facility] o Accepting Medicaid . [facility] o Bratton, MD; Coe-Goins, MD; Hayes, MD; Lewis, MD; List, MD; Lott, MD; Marshall, MD; Moran, MD; O'Neal, MD; Pierre-Louis, MD; Pitonzo, MD; Scholer, MD; Spangle, MD o [address] [place], [place]   [address] o [phone] o Mon-Fri 8:00-5:30, Sat (Oct.-Mar.) 9:00-1:00 o Babies seen by providers at [facility] o Accepting Medicaid o Must fill out new patient packet, available online at [email or web address] . [facility] ([facility] o Friddle, NP; Harris, NP; Kelly, NP; Logan, MD;  Melvin, PA; Poth, MD; Ramadoss, MD; Stanton, NP o [address], [place], [place] [address] o [phone] o Mon-Thur 8:00-5:30, Fri 8:00-5:00 o Babies seen by providers at [facility] o Accepting Medicaid  [place] ([address]) . [facility] o Dixon, PA; Orleans, MD; Pickard, MD; Tapia, PA o [address], [place], [place] [address] o [phone] o Mon-Fri 8:00-5:00 o Babies seen by providers at [facility] o Accepting Medicaid   [place] ([address]) . [facility] [place] o Masneri, DO; Meyers, MD; Nelson, PA o [address], [place], [place] [address] o [phone] o Mon-Fri 8:00-5:00 o Babies seen by providers at [facility] o Does NOT accept Medicaid o Limited appointment availability, please call early in hospitalization  . [facility] [place] o Kunedd, DO; McGowen, MD o [address], [place], [place] [address] o [phone] o Mon-Fri 8:00-5:00 o Babies seen by [facility] providers o Does NOT accept Medicaid . [facility] [place] o Cameron, MD; MacDonald, MD; Michaels, PA; Nayak, MD o [address] [place] [address], [place], [place] [address] o [phone] o Mon-Fri 8:00-5:00 o After hours clinic ([address] [place], [place] [address]) [phone] Mon-Fri 5:00-8:00, Sat 12:00-6:00, Sun 10:00-4:00 o Babies seen by [facility] providers o Accepting Medicaid . [facility] [place] o [address], [place], [place]  [address] o [phone]   Fax - [phone]  [place] ([address]) . [facility] [place] o Andy, MD o [address], [place], [place] [address] o [phone] o Mon-Fri 8:00-5:00 o Babies seen by [facility] providers o Does NOT accept Medicaid . [facility] [place] ([facility] [place]) o Eksir, MD o [address], [place], [place]  [address] o [phone] o Mon-Thur 8:00-7:00, Fri 8:00-5:00, Sat 8:00-12:00 o Babies seen by providers at [facility] o Accepting Medicaid - but does not have vaccinations in office (must be received elsewhere) o Limited availability, please call early in hospitalization  [place] ([address]) . [facility]  o Charlene Flemming, MD o [address],  [place] [address] o [phone]  Fax [phone]   

## 2020-04-05 NOTE — Progress Notes (Signed)
LOW-RISK PREGNANCY OFFICE VISIT  Patient name: Cassandra Hoover MRN [id number]  Date of birth: 1986/06/16 Chief Complaint:   Routine Prenatal Visit  Subjective:   Cassandra Hoover is a 34 y.o. G1P0 female at [redacted]w[redacted]d with an Estimated Date of Delivery: 06/24/20 being seen today for ongoing management of a low-risk pregnancy aeb has Coarctation of aorta; Aortic valve disorder; Supervision of normal first pregnancy, antepartum; Coarctation of aorta, congenital; Rh negative state in antepartum period, first trimester; and Dichorionic diamniotic twin pregnancy in first trimester on their problem list.  Patient presents today with complaint of epistaxis. Patient states the bleeding is intermittent and occurs 3-4x week.  Patient does endorse history of allergies. Patient questions safety of sleeping supine.   Patient denies vaginal concerns including abnormal discharge, leaking of fluid, and bleeding.  Contractions: Not present. Vag. Bleeding: None.  Movement: Present.  Reviewed past medical,surgical, social, obstetrical and family history as well as problem list, medications and allergies.  Objective  There were no vitals filed for this visit.There is no height or weight on file to calculate BMI.  Total Weight Gain:8 lb 9.6 oz (3.901 kg)         Physical Examination:   General appearance: Well appearing, and in no distress  Mental status: Alert, oriented to person, place, and time  Skin: Warm & dry  Cardiovascular: Normal heart rate noted  Respiratory: Normal respiratory effort, no distress  Abdomen: Soft, gravid, nontender, AGA with Fundal height of Fundal Height: 29 cm  Pelvic: Cervical exam deferred           Extremities: Edema: None  Fetal Status: Fetal Heart Rate (bpm): 152  Movement: Present   No results found for this or any previous visit (from the past 24 hour(s)).  Assessment & Plan:  Low-risk pregnancy of a 34 y.o., G1P0 at [redacted]w[redacted]d with an Estimated Date of Delivery: 06/24/20   1.  Supervision of normal first pregnancy, antepartum -Reviewed Glucola appt preparation including fasting the night before and morning of.   -Discussed anticipated office time of 2.5-3 hours.  -Reviewed blood draw procedures and labs which also include check of iron level.  -Discussed how results of GTT are handled including diabetic education and BS testing for abnormal results and routine care for normal results.  -Discussed how sleeping supine is okay.  Encouraged usage of pillow to prop one side if necessary.  -WB Consent Signed -Patient reports improvement in hip pain with Mg supplement.  -Encouraged to start looking for pediatricians.  2. Need for tetanus, diphtheria, and acellular pertussis (Tdap) vaccine in patient of adolescent age or older -Order and Given  3. Epistaxis -Discussed common occurrence during pregnancy. -Suggested saline washes or nasal gavage.      Meds: No orders of the defined types were placed in this encounter.  Labs/procedures today:  Lab Orders     Glucose Tolerance, 2 Hours w/1 Hour     HIV Antibody (routine testing w rflx)     RPR     CBC   Reviewed: Preterm labor symptoms and general obstetric precautions including but not limited to vaginal bleeding, contractions, leaking of fluid and fetal movement were reviewed in detail with the patient.  All questions were answered.  Follow-up: No follow-ups on file.  Orders Placed This Encounter  Procedures  . Tdap vaccine greater than or equal to 7yo IM  . Glucose Tolerance, 2 Hours w/1 Hour  . HIV Antibody (routine testing w rflx)  . RPR  . CBC  [name] MSN, CNM 04/05/2020

## 2020-04-06 LAB — CBC
Hematocrit: 36.5 % (ref 34.0–46.6)
Hemoglobin: 12 g/dL (ref 11.1–15.9)
MCH: 30.9 pg (ref 26.6–33.0)
MCHC: 32.9 g/dL (ref 31.5–35.7)
MCV: 94 fL (ref 79–97)
Platelets: 262 10*3/uL (ref 150–450)
RBC: 3.88 x10E6/uL (ref 3.77–5.28)
RDW: 12.1 % (ref 11.7–15.4)
WBC: 10 10*3/uL (ref 3.4–10.8)

## 2020-04-06 LAB — GLUCOSE TOLERANCE, 2 HOURS W/ 1HR
Glucose, 1 hour: 128 mg/dL (ref 65–179)
Glucose, 2 hour: 89 mg/dL (ref 65–152)
Glucose, Fasting: 71 mg/dL (ref 65–91)

## 2020-04-06 LAB — RPR: RPR Ser Ql: NONREACTIVE

## 2020-04-06 LAB — HIV ANTIBODY (ROUTINE TESTING W REFLEX): HIV Screen 4th Generation wRfx: NONREACTIVE

## 2020-04-19 ENCOUNTER — Ambulatory Visit: Payer: BC Managed Care – PPO | Attending: Obstetrics and Gynecology

## 2020-04-19 ENCOUNTER — Encounter: Payer: Self-pay | Admitting: *Deleted

## 2020-04-19 ENCOUNTER — Ambulatory Visit: Payer: BC Managed Care – PPO | Admitting: *Deleted

## 2020-04-19 ENCOUNTER — Other Ambulatory Visit: Payer: Self-pay

## 2020-04-19 ENCOUNTER — Other Ambulatory Visit: Payer: Self-pay | Admitting: *Deleted

## 2020-04-19 DIAGNOSIS — O43193 Other malformation of placenta, third trimester: Secondary | ICD-10-CM

## 2020-04-19 DIAGNOSIS — Z362 Encounter for other antenatal screening follow-up: Secondary | ICD-10-CM

## 2020-04-19 DIAGNOSIS — Z6791 Unspecified blood type, Rh negative: Secondary | ICD-10-CM

## 2020-04-19 DIAGNOSIS — Z3400 Encounter for supervision of normal first pregnancy, unspecified trimester: Secondary | ICD-10-CM

## 2020-04-19 DIAGNOSIS — O3110X0 Continuing pregnancy after spontaneous abortion of one fetus or more, unspecified trimester, not applicable or unspecified: Secondary | ICD-10-CM

## 2020-04-19 DIAGNOSIS — Z3A30 30 weeks gestation of pregnancy: Secondary | ICD-10-CM

## 2020-04-19 DIAGNOSIS — O26891 Other specified pregnancy related conditions, first trimester: Secondary | ICD-10-CM | POA: Diagnosis present

## 2020-04-19 DIAGNOSIS — O99891 Other specified diseases and conditions complicating pregnancy: Secondary | ICD-10-CM | POA: Diagnosis not present

## 2020-04-19 DIAGNOSIS — O30003 Twin pregnancy, unspecified number of placenta and unspecified number of amniotic sacs, third trimester: Secondary | ICD-10-CM | POA: Diagnosis not present

## 2020-04-19 DIAGNOSIS — Q251 Coarctation of aorta: Secondary | ICD-10-CM

## 2020-04-19 DIAGNOSIS — O43199 Other malformation of placenta, unspecified trimester: Secondary | ICD-10-CM

## 2020-05-03 ENCOUNTER — Telehealth (INDEPENDENT_AMBULATORY_CARE_PROVIDER_SITE_OTHER): Payer: BC Managed Care – PPO

## 2020-05-03 VITALS — BP 128/79 | HR 81 | Wt 194.9 lb

## 2020-05-03 DIAGNOSIS — O99413 Diseases of the circulatory system complicating pregnancy, third trimester: Secondary | ICD-10-CM

## 2020-05-03 DIAGNOSIS — O43199 Other malformation of placenta, unspecified trimester: Secondary | ICD-10-CM

## 2020-05-03 DIAGNOSIS — Z6791 Unspecified blood type, Rh negative: Secondary | ICD-10-CM

## 2020-05-03 DIAGNOSIS — O361930 Maternal care for other isoimmunization, third trimester, not applicable or unspecified: Secondary | ICD-10-CM

## 2020-05-03 DIAGNOSIS — Z3A32 32 weeks gestation of pregnancy: Secondary | ICD-10-CM

## 2020-05-03 DIAGNOSIS — O26899 Other specified pregnancy related conditions, unspecified trimester: Secondary | ICD-10-CM

## 2020-05-03 DIAGNOSIS — Z3400 Encounter for supervision of normal first pregnancy, unspecified trimester: Secondary | ICD-10-CM

## 2020-05-03 DIAGNOSIS — Q251 Coarctation of aorta: Secondary | ICD-10-CM

## 2020-05-03 NOTE — Progress Notes (Signed)
OBSTETRICS PRENATAL VIRTUAL VISIT ENCOUNTER NOTE  Provider [facility]: [facility]   I connected with Cassandra ScalesAshley Gow on 05/03/20 at 11:10 AM EDT by MyChart Video Encounter at home and verified that I am speaking with the correct person using two identifiers.   I discussed the limitations, risks, security and privacy concerns of performing an evaluation and management service virtually and the availability of in person appointments. I also discussed with the patient that there may be a patient responsible charge related to this service. The patient expressed understanding and agreed to proceed. Subjective:  Cassandra Hoover is a 34 y.o. G1P0 at 8951w4d being seen today for ongoing prenatal care.  She is currently monitored for the following issues for this low-risk pregnancy and has Coarctation of aorta; Aortic valve disorder; Supervision of normal first pregnancy, antepartum; Coarctation of aorta, congenital; Rh negative state in antepartum period, first trimester; and Dichorionic diamniotic twin pregnancy in first trimester on their problem list.  Patient reports no complaints. However, she has questions related to her most recent US.  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  Patient also with questions regarding postpartum care.  Contractions: Not present. Vag. Bleeding: None.  Movement: Present. Denies any leaking of fluid.   The following portions of the patient's history were reviewed and updated as appropriate: allergies, current medications, past family history, past medical history, past social history, past surgical history and problem list.   Objective:   Vitals:   05/03/20 1059  BP: 128/79  Pulse: 81  Weight: 194 lb 14.4 oz (88.4 kg)    Fetal Status:     Movement: Present     General:  Alert, oriented and cooperative. Patient is in no acute distress.  Respiratory: Normal respiratory effort, no problems with respiration  noted  Mental Status: Normal mood and affect. Normal behavior. Normal judgment and thought content.  Rest of physical exam deferred due to type of encounter  Imaging: US MFM OB FOLLOW UP  Result Date: 04/20/2020 ----------------------------------------------------------------------  OBSTETRICS REPORT                        (Signed Final 04/20/2020 07:33 am) ---------------------------------------------------------------------- Patient Info  ID #:       [id number]                          D.O.B.:  26-Apr-1986 (34 yrs)  Name:       Cassandra Hoover                    Visit Date: 04/19/2020 09:12 am ---------------------------------------------------------------------- Performed By  Attending:        Lin Landsmanorenthian Booker      [facility]       [facility]                    MD  Performed By:     Eden Lathearrie Stalter BS      [facility] ---------------------------------------------------------------------- Orders  #  Description                           Code        Ordered By  1  [place] MFM OB FOLLOW UP                   [phone]    Noralee Space ----------------------------------------------------------------------  #  Order #                     Accession #                Episode #  1  [id number]                   [id number]                 [id number] ---------------------------------------------------------------------- Indications  Encounter for other antenatal screening         Z36.2  follow-up  Medical complication of pregnancy               O26.90  (coarctation of aorta)  Twin pregnancy with loss (demise) of one        O30.009, O31.10X0  fetus, antepartum  Marginal insertion of umbilical cord affecting  O43.193  management of mother in third trimester  [redacted] weeks gestation of pregnancy                  Z3A.30 ---------------------------------------------------------------------- Fetal Evaluation  Num Of Fetuses:          1  Fetal Heart Rate(bpm):   141  Cardiac Activity:        Observed  Presentation:            Cephalic  Placenta:                Posterior  P. Cord Insertion:       Marginal insertion  Amniotic Fluid  AFI FV:      Within normal limits  AFI Sum(cm)     %Tile       Largest Pocket(cm)  16.9            62          7.8  RUQ(cm)       RLQ(cm)       LUQ(cm)        LLQ(cm)  7.8           3.4           3.3            2.4 ---------------------------------------------------------------------- Biometry  BPD:      79.9  mm     G. Age:  32w 1d         82  %    CI:        75.82   %    70 - 86  FL/HC:       19.7  %    19.3 - 21.3  HC:      290.9  mm     G. Age:  32w 0d         56  %    HC/AC:       1.09       0.96 - 1.17  AC:      266.6  mm     G. Age:  30w 5d         52  %    FL/BPD:      71.7  %    71 - 87  FL:       57.3  mm     G. Age:  30w 0d         22  %    FL/AC:       21.5  %    20 - 24  HUM:      50.3  mm     G. Age:  29w 3d         29  %  Est. FW:    1635   gm    3 lb 10 oz     44  % ---------------------------------------------------------------------- OB History  Gravidity:    1         Term:   0        Prem:   0        SAB:   0  TOP:          0       Ectopic:  0        Living: 0 ---------------------------------------------------------------------- Gestational Age  LMP:           32w 4d        Date:  09/04/19                 EDD:   06/10/20  U/S Today:     31w 2d                                        EDD:   06/19/20  Best:          30w 4d     Det. By:  U/S C R L Fetus B        EDD:   06/24/20                                      (12/01/19) ---------------------------------------------------------------------- Anatomy  Cranium:               Appears normal         Aortic Arch:            Appears normal  Cavum:                  Appears normal         Ductal Arch:            Appears normal  Ventricles:            Appears normal         Diaphragm:              Appears normal  Choroid Plexus:  Appears normal         Stomach:                Appears normal, left                                                                        sided  Cerebellum:            Appears normal         Abdomen:                Appears normal  Posterior Fossa:       Appears normal         Abdominal Wall:         Appears nml (cord                                                                        insert, abd wall)  Nuchal Fold:           Previously seen        Cord Vessels:           Appears normal (3                                                                        vessel cord)  Face:                  Appears normal         Kidneys:                Appear normal                         (orbits and profile)  Lips:                  Appears normal         Bladder:                Appears normal  Thoracic:              Appears normal         Spine:                  Previously seen  Heart:                 Appears normal         Upper Extremities:      Previously seen                         (4CH, axis, and  situs)  RVOT:                  Appears normal         Lower Extremities:      Previously seen  LVOT:                  Appears normal  Other:  Female gender previously seen.. Nasal bone visualized. Open hands          previously visualized. Heels and 5th digit previously visualized. ---------------------------------------------------------------------- Cervix Uterus Adnexa  Cervix  Not visualized (advanced GA >24wks)  Uterus  No abnormality visualized.  Right Ovary  Within normal limits.  Left Ovary  Within normal limits.  Cul De Sac  No free fluid seen.  Adnexa  No abnormality visualized. ---------------------------------------------------------------------- Impression  Follow up growth due to marginal cord insertion. The   pregnancy is complicated by a prior demised twin  Normal interval growth was observed today with  measurments consistent with dates  There is good fetal movement and amniotic fluid as well. ---------------------------------------------------------------------- Recommendations  Follow up growth is scheduled in 4 weeks. ----------------------------------------------------------------------               Lin Landsman, MD Electronically Signed Final Report   04/20/2020 07:33 am ----------------------------------------------------------------------   Assessment and Plan:  Pregnancy: G1P0 at [redacted]w[redacted]d 1. Supervision of normal first pregnancy, antepartum  -Addressed questions regarding travel during pregnancy.  Encouraged to notify office. -Reviewed normalcy and comfort measures for Epistaxis during pregnancy. -Discussed patient desire for Phexxi for PP birth control method.  Gave limited review of research and outcomes.  -Discussed Covid testing while in the hospital and how if positive WB no longer an option.   2. Marginal insertion of umbilical cord affecting management of mother -Provider reviews most recent and previous US. -After extensive review MCI noted on all exams, but not identified in impression. -Reassurances and education given.  Discussed why MCI is concern in pregnancy and need for closer monitoring.  3. Rh negative state in antepartum period -Will have patient come to office for Rhogam injection.  -Message sent to staff for scheduling accordingly.    Preterm labor symptoms and general obstetric precautions including but not limited to vaginal bleeding, contractions, leaking of fluid and fetal movement were reviewed in detail with the patient. I discussed the assessment and treatment plan with the patient. The patient was provided an opportunity to ask questions and all were answered. The patient agreed with the plan and demonstrated an understanding of the instructions. The patient  was advised to call back or seek an in-person office evaluation/go to MAU at [facility] for any urgent or concerning symptoms. Please refer to After Visit Summary for other counseling recommendations.   I provided 22 minutes of face-to-face time during this encounter.  No follow-ups on file.  Future Appointments  Date Time Provider Department Center  05/15/2020 10:45 AM [facility] NURSE [facility]  05/15/2020 11:00 AM [facility] US1 [facility]  05/31/2020 10:30 AM Gerrit Heck, CNM [facility] None    [name], CNM Center for [employer], [facility]

## 2020-05-04 DIAGNOSIS — O43199 Other malformation of placenta, unspecified trimester: Secondary | ICD-10-CM | POA: Insufficient documentation

## 2020-05-06 ENCOUNTER — Telehealth: Payer: Self-pay | Admitting: General Practice

## 2020-05-06 NOTE — Telephone Encounter (Signed)
-----   Message from Gerrit Heck, [place] sent at 05/04/2020  3:18 AM EDT ----- Regarding: [name] needs a nurse visit for her Rhogam injection.  Please try to get her in this week sometime for that.  Thanks,  JE

## 2020-05-06 NOTE — Telephone Encounter (Signed)
Left message on VM for pt to give our office a call back to schedule Rhogam injection this week.

## 2020-05-10 ENCOUNTER — Ambulatory Visit (INDEPENDENT_AMBULATORY_CARE_PROVIDER_SITE_OTHER): Payer: BC Managed Care – PPO | Admitting: *Deleted

## 2020-05-10 ENCOUNTER — Other Ambulatory Visit: Payer: Self-pay

## 2020-05-10 VITALS — BP 125/82 | HR 87 | Temp 97.9°F | Ht 68.0 in | Wt 196.4 lb

## 2020-05-10 DIAGNOSIS — Z6791 Unspecified blood type, Rh negative: Secondary | ICD-10-CM

## 2020-05-10 DIAGNOSIS — Z3400 Encounter for supervision of normal first pregnancy, unspecified trimester: Secondary | ICD-10-CM | POA: Diagnosis not present

## 2020-05-10 DIAGNOSIS — O26899 Other specified pregnancy related conditions, unspecified trimester: Secondary | ICD-10-CM | POA: Diagnosis not present

## 2020-05-10 MED ORDER — RHO D IMMUNE GLOBULIN 1500 UNIT/2ML IJ SOSY
300.0000 ug | PREFILLED_SYRINGE | Freq: Once | INTRAMUSCULAR | Status: AC
  Administered 2020-05-10: 300 ug via INTRAMUSCULAR

## 2020-05-10 NOTE — Progress Notes (Signed)
   Cassandra Hoover 34 year old female in clinic for second dosage of Rhophylac 300 mcg IM x 1 today. Patient is [redacted]w[redacted]d gestation. Patient was given first dosage 12/01/19. Patient tolerated the injection; given RUOQ. Patient is positive fetal movement and pelvic pressure at times. Patient to call clinic or send Mychart message as needed with questions/concerns.   Clovis Pu, RN

## 2020-05-14 ENCOUNTER — Encounter: Payer: Self-pay | Admitting: General Practice

## 2020-05-15 ENCOUNTER — Ambulatory Visit: Payer: BC Managed Care – PPO | Admitting: *Deleted

## 2020-05-15 ENCOUNTER — Other Ambulatory Visit: Payer: Self-pay

## 2020-05-15 ENCOUNTER — Ambulatory Visit: Payer: BC Managed Care – PPO | Attending: Obstetrics and Gynecology

## 2020-05-15 DIAGNOSIS — O43193 Other malformation of placenta, third trimester: Secondary | ICD-10-CM | POA: Diagnosis not present

## 2020-05-15 DIAGNOSIS — Z6791 Unspecified blood type, Rh negative: Secondary | ICD-10-CM | POA: Insufficient documentation

## 2020-05-15 DIAGNOSIS — O26899 Other specified pregnancy related conditions, unspecified trimester: Secondary | ICD-10-CM | POA: Diagnosis present

## 2020-05-15 DIAGNOSIS — O2693 Pregnancy related conditions, unspecified, third trimester: Secondary | ICD-10-CM | POA: Diagnosis not present

## 2020-05-15 DIAGNOSIS — O43199 Other malformation of placenta, unspecified trimester: Secondary | ICD-10-CM | POA: Diagnosis present

## 2020-05-15 DIAGNOSIS — Z3A34 34 weeks gestation of pregnancy: Secondary | ICD-10-CM

## 2020-05-15 DIAGNOSIS — O3110X0 Continuing pregnancy after spontaneous abortion of one fetus or more, unspecified trimester, not applicable or unspecified: Secondary | ICD-10-CM | POA: Diagnosis not present

## 2020-05-15 DIAGNOSIS — Z3400 Encounter for supervision of normal first pregnancy, unspecified trimester: Secondary | ICD-10-CM | POA: Insufficient documentation

## 2020-05-15 DIAGNOSIS — O30003 Twin pregnancy, unspecified number of placenta and unspecified number of amniotic sacs, third trimester: Secondary | ICD-10-CM

## 2020-05-15 DIAGNOSIS — Z362 Encounter for other antenatal screening follow-up: Secondary | ICD-10-CM

## 2020-05-17 ENCOUNTER — Encounter: Payer: Self-pay | Admitting: Student

## 2020-05-31 ENCOUNTER — Telehealth: Payer: Self-pay

## 2020-05-31 ENCOUNTER — Ambulatory Visit (INDEPENDENT_AMBULATORY_CARE_PROVIDER_SITE_OTHER): Payer: BC Managed Care – PPO

## 2020-05-31 ENCOUNTER — Other Ambulatory Visit: Payer: Self-pay

## 2020-05-31 ENCOUNTER — Other Ambulatory Visit (HOSPITAL_COMMUNITY)
Admission: RE | Admit: 2020-05-31 | Discharge: 2020-05-31 | Disposition: A | Discharge status: Home / Self Care | Payer: BC Managed Care – PPO | Source: Ambulatory Visit

## 2020-05-31 VITALS — BP 128/88 | HR 91 | Temp 87.0°F | Wt 202.6 lb

## 2020-05-31 DIAGNOSIS — Z3400 Encounter for supervision of normal first pregnancy, unspecified trimester: Secondary | ICD-10-CM | POA: Diagnosis not present

## 2020-05-31 DIAGNOSIS — Z3403 Encounter for supervision of normal first pregnancy, third trimester: Secondary | ICD-10-CM | POA: Diagnosis present

## 2020-05-31 DIAGNOSIS — O133 Gestational [pregnancy-induced] hypertension without significant proteinuria, third trimester: Secondary | ICD-10-CM

## 2020-05-31 DIAGNOSIS — Z3A36 36 weeks gestation of pregnancy: Secondary | ICD-10-CM

## 2020-05-31 LAB — COMPREHENSIVE METABOLIC PANEL
ALT: 9 IU/L (ref 0–32)
AST: 11 IU/L (ref 0–40)
Albumin/Globulin Ratio: 1.6 (ref 1.2–2.2)
Albumin: 3.6 g/dL — ABNORMAL LOW (ref 3.8–4.8)
Alkaline Phosphatase: 158 IU/L — ABNORMAL HIGH (ref 44–121)
BUN/Creatinine Ratio: 13 (ref 9–23)
BUN: 8 mg/dL (ref 6–20)
Bilirubin Total: <0.2 mg/dL (ref 0.0–1.2)
CO2: 23 mmol/L (ref 20–29)
Calcium: 9.5 mg/dL (ref 8.7–10.2)
Chloride: 104 mmol/L (ref 96–106)
Creatinine, Ser: 0.61 mg/dL (ref 0.57–1.00)
GFR calc Af Amer: 139 mL/min/{1.73_m2} (ref >59)
GFR calc non Af Amer: 120 mL/min/{1.73_m2} (ref >59)
Globulin, Total: 2.3 g/dL (ref 1.5–4.5)
Glucose: 73 mg/dL (ref 65–99)
Potassium: 4.2 mmol/L (ref 3.5–5.2)
Sodium: 138 mmol/L (ref 134–144)
Total Protein: 5.9 g/dL — ABNORMAL LOW (ref 6.0–8.5)

## 2020-05-31 LAB — PROTEIN / CREATININE RATIO, URINE
Creatinine, Urine: 91.7 mg/dL
Protein, Ur: 13.2 mg/dL
Protein/Creat Ratio: 144 mg/g creat (ref 0–200)

## 2020-05-31 LAB — CBC
Hematocrit: 35.8 % (ref 34.0–46.6)
Hemoglobin: 12.2 g/dL (ref 11.1–15.9)
MCH: 31.1 pg (ref 26.6–33.0)
MCHC: 34.1 g/dL (ref 31.5–35.7)
MCV: 91 fL (ref 79–97)
Platelets: 206 10*3/uL (ref 150–450)
RBC: 3.92 x10E6/uL (ref 3.77–5.28)
RDW: 12.5 % (ref 11.7–15.4)
WBC: 8.2 10*3/uL (ref 3.4–10.8)

## 2020-05-31 NOTE — Telephone Encounter (Signed)
Cassandra Hoover May 24, 1986  PreEclampsia labs return without significant findings.  Results received via stat phone call to providers personal cell-phone from [place] at [facility].  Patient contacted, via providers personal phone, to number on file and no answer.  No message left d/t non-descript voicemail.  Patient with authorization for husband, Cassandra Hoover, to be contacted.  Provider tries this number with no answer, but with voicemail identifying husband name.  Message left informing husband of patient lab results and plan for care and follow up reiterated.  Encouraged to reach out via mychart, phone, or if emergent through MAU for any questions or concerns.  Provider will also release labs via mychart with note of insignificant findings.   Cassandra Robins MSN, CNM Advanced Practice Provider, Center for [employer]

## 2020-05-31 NOTE — Patient Instructions (Signed)

## 2020-05-31 NOTE — Progress Notes (Signed)
LOW-RISK PREGNANCY OFFICE VISIT  Patient name: Cassandra Hoover MRN [id number]  Date of birth: 1986/02/28 Chief Complaint:   Routine Prenatal Visit  Subjective:   Cassandra Hoover is a 34 y.o. G1P0 female at 54w4dwith an Estimated Date of Delivery: 06/24/20 being seen today for ongoing management of a low-risk pregnancy aeb has Coarctation of aorta; Aortic valve disorder; Supervision of normal first pregnancy, antepartum; Coarctation of aorta, congenital; Rh negative state in antepartum period; Dichorionic diamniotic twin pregnancy in first trimester; and Marginal insertion of umbilical cord affecting management of mother on their problem list.  Patient presents today expressing desire for delivery.  Patient states she has been preparing for baby and feels now all she needs is baby!  Patient reports some pelvic pressure and requests cervical exam.  Patient endorses fetal movement and denies vaginal concerns including abnormal discharge, leaking of fluid, and bleeding.  Contractions: Not present. Vag. Bleeding: None.  Movement: Present.  Reviewed past medical,surgical, social, obstetrical and family history as well as problem list, medications and allergies.  Objective   Vitals:   05/31/20 1027 05/31/20 1041  BP: (!) 144/91 128/88  Pulse: 87 91  Temp: (!) 87 F (30.6 C)   Weight: 202 lb 9.6 oz (91.9 kg)   Body mass index is 30.81 kg/m.  Total Weight Gain:19 lb 9.6 oz (8.891 kg)         Physical Examination:   General appearance: Well appearing, and in no distress  Mental status: Alert, oriented to person, place, and time  Skin: Warm & dry  Cardiovascular: Normal heart rate noted  Respiratory: Normal respiratory effort, no distress  Abdomen: Soft, gravid, nontender, AGA with Fundal height of Fundal Height: 37 cm  Pelvic: Cervical exam performed  Dilation: Closed Effacement (%): 0 Station: Ballotable Presentation: Vertex External Os 2-3cm, Internal Os Closed  Extremities: Edema:  None  Fetal Status: Fetal Heart Rate (bpm): 130  Movement: Present   Results for orders placed or performed in visit on 05/31/20 (from the past 24 hour(s))  Comp Met (CMET)   Collection Time: 05/31/20 11:39 AM  Result Value Ref Range   Glucose 73 65 - 99 mg/dL   BUN 8 6 - 20 mg/dL   Creatinine, Ser 0.61 0.57 - 1.00 mg/dL   GFR calc non Af Amer 120 >59 mL/min/1.73   GFR calc Af Amer 139 >59 mL/min/1.73   BUN/Creatinine Ratio 13 9 - 23   Sodium 138 134 - 144 mmol/L   Potassium 4.2 3.5 - 5.2 mmol/L   Chloride 104 96 - 106 mmol/L   CO2 23 20 - 29 mmol/L   Calcium 9.5 8.7 - 10.2 mg/dL   Total Protein 5.9 (L) 6.0 - 8.5 g/dL   Albumin 3.6 (L) 3.8 - 4.8 g/dL   Globulin, Total 2.3 1.5 - 4.5 g/dL   Albumin/Globulin Ratio 1.6 1.2 - 2.2   Bilirubin Total <0.2 0.0 - 1.2 mg/dL   Alkaline Phosphatase 158 (H) 44 - 121 IU/L   AST 11 0 - 40 IU/L   ALT 9 0 - 32 IU/L  CBC   Collection Time: 05/31/20 11:39 AM  Result Value Ref Range   WBC 8.2 3.4 - 10.8 x10E3/uL   RBC 3.92 3.77 - 5.28 x10E6/uL   Hemoglobin 12.2 11.1 - 15.9 g/dL   Hematocrit 35.8 34.0 - 46.6 %   MCV 91 79 - 97 fL   MCH 31.1 26.6 - 33.0 pg   MCHC 34.1 31 - 35 g/dL   RDW  12.5 11.7 - 15.4 %   Platelets 206 150 - 450 x10E3/uL  Protein / creatinine ratio, urine   Collection Time: 05/31/20 11:39 AM  Result Value Ref Range   Creatinine, Urine WILL FOLLOW    Protein, Ur WILL FOLLOW    Protein/Creat Ratio WILL FOLLOW     Assessment & Plan:  Low-risk pregnancy of a 34 y.o., G1P0 at 86w4dwith an Estimated Date of Delivery: 06/24/20   1. Supervision of normal first pregnancy, antepartum -Questions and concerns addressed regarding pediatrician, delivery, and postpartum care. -Encouraged to rest while waiting on labor to occur.   2. Gestational hypertension, third trimester -Chart reviewed and mild elevations noted (130-138/76-81). Majority noted prior to 20wks [facility] -Patient with some questionable  white coat syndrome, as 2nd blood pressures, in office, significantly improved. 141/91 then 128/88 after resting 20 minutes.  -Discussed GHTN diagnosis after reviewing blood pressures and recommendation for delivery after 37 weeks, but no later than 39 weeks. -Will complete PreE labs today and if normal will perform blood pressure check on Monday. -[facility]  If normal proceed to appt on Friday and IOL to be scheduled after unless blood pressure is elevated. -Patient instructed to take her blood pressure daily, at the same time, and if elevated report to MAU for admission. -Informed that labs to be sent today stat and provider will call after hours to discuss results. If abnormal plan would be discussed with attending and need for admission may be necessary. -Reviewed WB contradictions and informed that GHTN is considered high risk state.  Patient verbalizes understanding and states goal is for healthy baby and for her ([place] to leave the hospital alive! -Patient informed that providers goal is for Healthy Mom and baby!   3. [redacted] weeks gestation of pregnancy -Labs as below. -Educated on GBS bacteria including what it is, why we test, and how and when we treat if needed. -Discussed releasing of results to mychart.    Meds: No orders of the defined types were placed in this encounter.  Labs/procedures today:  Lab Orders     Culture, beta strep (group b only)     Comp Met (CMET)     CBC     Protein / creatinine ratio, urine   Reviewed: Preterm labor symptoms and general obstetric precautions including but not limited to vaginal bleeding, contractions, leaking of fluid and fetal movement were reviewed in detail with the patient.  All questions were answered.  Follow-up: Return for Provider to call with follow up schedule.  Orders Placed This Encounter  Procedures  . Culture, beta strep (group b only)  . Comp Met (CMET)  . CBC   . Protein / creatinine ratio, urine   JMaryann ConnersMSN, CNM 05/31/2020

## 2020-06-03 ENCOUNTER — Other Ambulatory Visit: Payer: Self-pay

## 2020-06-03 ENCOUNTER — Ambulatory Visit (INDEPENDENT_AMBULATORY_CARE_PROVIDER_SITE_OTHER): Payer: BC Managed Care – PPO | Admitting: *Deleted

## 2020-06-03 VITALS — BP 136/82 | HR 89 | Temp 97.5°F | Wt 202.0 lb

## 2020-06-03 DIAGNOSIS — B951 Streptococcus, group B, as the cause of diseases classified elsewhere: Secondary | ICD-10-CM | POA: Insufficient documentation

## 2020-06-03 DIAGNOSIS — O169 Unspecified maternal hypertension, unspecified trimester: Secondary | ICD-10-CM | POA: Insufficient documentation

## 2020-06-03 DIAGNOSIS — O26899 Other specified pregnancy related conditions, unspecified trimester: Secondary | ICD-10-CM

## 2020-06-03 DIAGNOSIS — Z3400 Encounter for supervision of normal first pregnancy, unspecified trimester: Secondary | ICD-10-CM

## 2020-06-03 DIAGNOSIS — Z6791 Unspecified blood type, Rh negative: Secondary | ICD-10-CM

## 2020-06-03 LAB — CERVICOVAGINAL ANCILLARY ONLY
Bacterial Vaginitis (gardnerella): NEGATIVE
Candida Glabrata: NEGATIVE
Candida Vaginitis: NEGATIVE
Chlamydia: NEGATIVE
Comment: NEGATIVE
Comment: NEGATIVE
Comment: NEGATIVE
Comment: NEGATIVE
Comment: NEGATIVE
Comment: NORMAL
Neisseria Gonorrhea: NEGATIVE
Trichomonas: NEGATIVE

## 2020-06-03 LAB — CULTURE, BETA STREP (GROUP B ONLY): Strep Gp B Culture: POSITIVE — AB

## 2020-06-03 NOTE — Telephone Encounter (Signed)
Left voice message for patient to call the office for blood pressure check.   Clovis Pu, RN

## 2020-06-03 NOTE — Progress Notes (Addendum)
   Subjective:  Cassandra Hoover is a 34 y.o. female here for BP check.   Hypertension ROS: home BP monitoring in range of 143's systolic over 89's diastolic, no TIA's, no chest pain on exertion, no dyspnea on exertion and no swelling of ankles.   Saturday Blood pressure 06/01/20: all taken 10-15 minutes apart. Patient was resting.  1. 143/80 2. 140/87 3. 138/102 4. 137/81 5. 127/73  Sunday Blood pressure 06/02/20: 1. 118/68 at 12:15 PM 2. 124/89 at 11:30 PM  Monday 06/03/20 at 9:50 AM 143/89  Objective:  BP 136/82 (BP Location: Left Arm, Patient Position: Sitting, Cuff Size: Large)   Pulse 89   Temp (!) 97.5 F (36.4 C) (Oral)   Wt 202 lb (91.6 kg)   LMP 09/04/2019 (Exact Date)   BMI 30.71 kg/m     Appearance alert, well appearing, and in no distress, oriented to person, place, and time and normal appearing weight. General exam BP noted to be well controlled today in office.    Assessment:   Blood Pressure stable and asymptomatic.   Plan:  Provider will review note and advise. Patient is ok to go home today. No BP check today at home.  Patient to check BP at home once daily at the same time. If BP at home is above 145/100, patient need to call the office and go to MAU.  Return to clinic on Wednesday 06/05/20 for BP check. If BP is below 140/90, pt can keep appointment for Friday 06/07/20, if BP is above 140/90, pt should be sent to MAU. Per Gerrit Heck, CNM.  Clovis Pu, RN

## 2020-06-03 NOTE — Telephone Encounter (Signed)
Tried calling patient's husband, unable to leave a voice message.  Clovis Pu, RN

## 2020-06-04 ENCOUNTER — Other Ambulatory Visit: Payer: Self-pay

## 2020-06-04 DIAGNOSIS — O133 Gestational [pregnancy-induced] hypertension without significant proteinuria, third trimester: Secondary | ICD-10-CM

## 2020-06-04 DIAGNOSIS — Z3400 Encounter for supervision of normal first pregnancy, unspecified trimester: Secondary | ICD-10-CM

## 2020-06-05 ENCOUNTER — Other Ambulatory Visit: Payer: Self-pay

## 2020-06-05 ENCOUNTER — Ambulatory Visit (INDEPENDENT_AMBULATORY_CARE_PROVIDER_SITE_OTHER): Payer: BC Managed Care – PPO | Admitting: *Deleted

## 2020-06-05 ENCOUNTER — Encounter (HOSPITAL_COMMUNITY): Payer: Self-pay | Admitting: Obstetrics & Gynecology

## 2020-06-05 ENCOUNTER — Inpatient Hospital Stay (HOSPITAL_COMMUNITY)
Admission: AD | Admit: 2020-06-05 | Discharge: 2020-06-08 | DRG: 806 | Disposition: A | Discharge status: Home / Self Care | Payer: BC Managed Care – PPO | Attending: Obstetrics and Gynecology | Admitting: Obstetrics and Gynecology

## 2020-06-05 DIAGNOSIS — O43199 Other malformation of placenta, unspecified trimester: Secondary | ICD-10-CM | POA: Diagnosis present

## 2020-06-05 DIAGNOSIS — O99824 Streptococcus B carrier state complicating childbirth: Secondary | ICD-10-CM | POA: Diagnosis present

## 2020-06-05 DIAGNOSIS — Z3A37 37 weeks gestation of pregnancy: Secondary | ICD-10-CM | POA: Diagnosis not present

## 2020-06-05 DIAGNOSIS — Z3400 Encounter for supervision of normal first pregnancy, unspecified trimester: Secondary | ICD-10-CM

## 2020-06-05 DIAGNOSIS — O133 Gestational [pregnancy-induced] hypertension without significant proteinuria, third trimester: Secondary | ICD-10-CM

## 2020-06-05 DIAGNOSIS — O26893 Other specified pregnancy related conditions, third trimester: Secondary | ICD-10-CM | POA: Diagnosis present

## 2020-06-05 DIAGNOSIS — O26899 Other specified pregnancy related conditions, unspecified trimester: Secondary | ICD-10-CM

## 2020-06-05 DIAGNOSIS — Z20822 Contact with and (suspected) exposure to covid-19: Secondary | ICD-10-CM | POA: Diagnosis present

## 2020-06-05 DIAGNOSIS — O99892 Other specified diseases and conditions complicating childbirth: Secondary | ICD-10-CM | POA: Diagnosis present

## 2020-06-05 DIAGNOSIS — O43123 Velamentous insertion of umbilical cord, third trimester: Secondary | ICD-10-CM | POA: Diagnosis present

## 2020-06-05 DIAGNOSIS — Z6791 Unspecified blood type, Rh negative: Secondary | ICD-10-CM | POA: Diagnosis not present

## 2020-06-05 DIAGNOSIS — Q251 Coarctation of aorta: Secondary | ICD-10-CM

## 2020-06-05 DIAGNOSIS — B951 Streptococcus, group B, as the cause of diseases classified elsewhere: Secondary | ICD-10-CM | POA: Diagnosis present

## 2020-06-05 DIAGNOSIS — O134 Gestational [pregnancy-induced] hypertension without significant proteinuria, complicating childbirth: Secondary | ICD-10-CM | POA: Diagnosis present

## 2020-06-05 HISTORY — DX: Gestational (pregnancy-induced) hypertension without significant proteinuria, third trimester: O13.3

## 2020-06-05 LAB — CBC
HCT: 36.3 % (ref 36.0–46.0)
Hemoglobin: 11.7 g/dL — ABNORMAL LOW (ref 12.0–15.0)
MCH: 30.5 pg (ref 26.0–34.0)
MCHC: 32.2 g/dL (ref 30.0–36.0)
MCV: 94.5 fL (ref 80.0–100.0)
Platelets: 212 10*3/uL (ref 150–400)
RBC: 3.84 MIL/uL — ABNORMAL LOW (ref 3.87–5.11)
RDW: 13.1 % (ref 11.5–15.5)
WBC: 7.9 10*3/uL (ref 4.0–10.5)
nRBC: 0 % (ref 0.0–0.2)

## 2020-06-05 LAB — COMPREHENSIVE METABOLIC PANEL
ALT: 14 U/L (ref 0–44)
AST: 21 U/L (ref 15–41)
Albumin: 2.9 g/dL — ABNORMAL LOW (ref 3.5–5.0)
Alkaline Phosphatase: 140 U/L — ABNORMAL HIGH (ref 38–126)
Anion gap: 9 (ref 5–15)
BUN: 5 mg/dL — ABNORMAL LOW (ref 6–20)
CO2: 21 mmol/L — ABNORMAL LOW (ref 22–32)
Calcium: 9.3 mg/dL (ref 8.9–10.3)
Chloride: 107 mmol/L (ref 98–111)
Creatinine, Ser: 0.65 mg/dL (ref 0.44–1.00)
GFR, Estimated: >60 mL/min (ref >60)
Glucose, Bld: 68 mg/dL — ABNORMAL LOW (ref 70–99)
Potassium: 3.9 mmol/L (ref 3.5–5.1)
Sodium: 137 mmol/L (ref 135–145)
Total Bilirubin: 0.4 mg/dL (ref 0.3–1.2)
Total Protein: 6.6 g/dL (ref 6.5–8.1)

## 2020-06-05 LAB — PROTEIN / CREATININE RATIO, URINE
Creatinine, Urine: 48.06 mg/dL
Protein Creatinine Ratio: 0.12 mg/mg{Cre} (ref 0.00–0.15)
Total Protein, Urine: 6 mg/dL

## 2020-06-05 LAB — RESPIRATORY PANEL BY RT PCR (FLU A&B, COVID)
Influenza A by PCR: NEGATIVE
Influenza B by PCR: NEGATIVE
SARS Coronavirus 2 by RT PCR: NEGATIVE

## 2020-06-05 LAB — TYPE AND SCREEN
ABO/RH(D): O NEG
Antibody Screen: POSITIVE

## 2020-06-05 MED ORDER — LABETALOL HCL 5 MG/ML IV SOLN: 20.0000 mg | INTRAVENOUS | Status: DC | PRN

## 2020-06-05 MED ORDER — SODIUM CHLORIDE 0.9 % IV SOLN
5.0000 10*6.[IU] | Freq: Once | INTRAVENOUS | Status: AC
  Administered 2020-06-05: 5 10*6.[IU] via INTRAVENOUS
  Filled 2020-06-05: qty 5

## 2020-06-05 MED ORDER — ONDANSETRON HCL 4 MG/2ML IJ SOLN
4.0000 mg | Freq: Four times a day (QID) | INTRAMUSCULAR | Status: DC | PRN
  Administered 2020-06-05 – 2020-06-06 (×2): 4 mg via INTRAVENOUS
  Filled 2020-06-05 (×2): qty 2

## 2020-06-05 MED ORDER — MISOPROSTOL 50MCG HALF TABLET
50.0000 ug | ORAL_TABLET | ORAL | Status: DC | PRN
  Administered 2020-06-05 – 2020-06-06 (×2): 50 ug via ORAL
  Filled 2020-06-05: qty 1

## 2020-06-05 MED ORDER — OXYTOCIN-SODIUM CHLORIDE 30-0.9 UT/500ML-% IV SOLN: 2.5000 [IU]/h | INTRAVENOUS | Status: DC

## 2020-06-05 MED ORDER — LABETALOL HCL 5 MG/ML IV SOLN: 40.0000 mg | INTRAVENOUS | Status: DC | PRN

## 2020-06-05 MED ORDER — PENICILLIN G POT IN DEXTROSE 60000 UNIT/ML IV SOLN
3.0000 10*6.[IU] | INTRAVENOUS | Status: DC
  Administered 2020-06-05 – 2020-06-06 (×7): 3 10*6.[IU] via INTRAVENOUS
  Filled 2020-06-05 (×7): qty 50

## 2020-06-05 MED ORDER — OXYTOCIN BOLUS FROM INFUSION
333.0000 mL | Freq: Once | INTRAVENOUS | Status: AC
  Administered 2020-06-06: 333 mL via INTRAVENOUS

## 2020-06-05 MED ORDER — LACTATED RINGERS IV SOLN: 500.0000 mL | INTRAVENOUS | Status: DC | PRN

## 2020-06-05 MED ORDER — ACETAMINOPHEN 325 MG PO TABS: 650.0000 mg | ORAL_TABLET | ORAL | Status: DC | PRN

## 2020-06-05 MED ORDER — SOD CITRATE-CITRIC ACID 500-334 MG/5ML PO SOLN: 30.0000 mL | ORAL | Status: DC | PRN

## 2020-06-05 MED ORDER — LACTATED RINGERS IV SOLN: INTRAVENOUS | Status: DC

## 2020-06-05 MED ORDER — TERBUTALINE SULFATE 1 MG/ML IJ SOLN: 0.2500 mg | Freq: Once | INTRAMUSCULAR | Status: DC | PRN

## 2020-06-05 MED ORDER — HYDRALAZINE HCL 20 MG/ML IJ SOLN: 10.0000 mg | INTRAMUSCULAR | Status: DC | PRN

## 2020-06-05 MED ORDER — FENTANYL CITRATE (PF) 100 MCG/2ML IJ SOLN
50.0000 ug | INTRAMUSCULAR | Status: DC | PRN
  Administered 2020-06-05 – 2020-06-06 (×3): 100 ug via INTRAVENOUS
  Filled 2020-06-05 (×3): qty 2

## 2020-06-05 MED ORDER — LABETALOL HCL 5 MG/ML IV SOLN: 80.0000 mg | INTRAVENOUS | Status: DC | PRN

## 2020-06-05 MED ORDER — OXYTOCIN-SODIUM CHLORIDE 30-0.9 UT/500ML-% IV SOLN
1.0000 m[IU]/min | INTRAVENOUS | Status: DC
  Administered 2020-06-05 – 2020-06-06 (×2): 2 m[IU]/min via INTRAVENOUS
  Filled 2020-06-05 (×2): qty 500

## 2020-06-05 MED ORDER — LIDOCAINE HCL (PF) 1 % IJ SOLN: 30.0000 mL | INTRAMUSCULAR | Status: DC | PRN

## 2020-06-05 NOTE — Progress Notes (Signed)
Cassandra Hoover MRN: [id number]  Subjective: -Nurse call reports time for 2nd dose of cytotec.  Provider to bedside and patient in bathroom.  Patient reports improvement in pain "with last tug of foley bulb."  Endorses fetal movement. Husband remains at bedside, supportive.   Objective: BP (!) 145/86   Pulse 79   Temp 98.2 F (36.8 C) (Oral)   Resp 16   Ht 5\' 8"  (1.727 m)   Wt 92.8 kg   LMP 09/04/2019 (Exact Date)   BMI 31.09 kg/m  No intake/output data recorded. No intake/output data recorded.  Fetal Monitoring: FHT: 145 bpm, Mod Var, -Decels, +Accels UC: Q1-3 min palpates mild    Vaginal Exam: SVE:   Dilation: 5.5 Effacement (%): 50 Exam by:: Roneisha Stern, cnm Membranes:Intact Internal Monitors: None  Augmentation/Induction: Pitocin:Initiated Cytotec: S/P One Dose Foley bulb removed from vagina prior to exam  Assessment:  IUP at 37.2 weeks Cat I FT  IOL s/t GHTN   Plan: -Strip reviewed with Dr. [phone] as provider has concerns regarding contraction pattern. Advised: *Start pitocin and evaluate if no change consider cytotec, but current contraction pattern could be managed better with pitocin. -Provider to bedside to discuss POC with patient and husband who is without questions or concerns. -Pitocin to be initiated as ordered-2x1 -Patient informed of availability of pain medication as needed. -Continue other mgmt as ordered   Macon Large, CNM Advanced Practice Provider, Center for [facility] 06/05/2020, 5:43 PM

## 2020-06-05 NOTE — H&P (Signed)
Cassandra Hoover is a 34 y.o. female, G1P0 at 37.2 weeks, presenting for IOL s/t GHTN. Patient receives care at [facility] and was supervised for a low risk pregnancy that progressed to high risk with onset of GHTN. Pregnancy and medical history significant for problems as listed below. She is GBS positive and is open to all options regarding pain management.  She is anticipating a female infant and requests Phexxi for PP birth control method, but has used IUD in the past with satisfaction.    Patient Active Problem List   Diagnosis Date Noted  . Group beta Strep positive 06/03/2020  . Elevated blood pressure affecting pregnancy, antepartum 06/03/2020  . Marginal insertion of umbilical cord affecting management of mother 05/04/2020  . Dichorionic diamniotic twin pregnancy in first trimester   . Rh negative state in antepartum period 11/27/2019  . Coarctation of aorta, congenital   . Supervision of normal first pregnancy, antepartum 10/24/2019  . Coarctation of aorta 05/17/2013  . Aortic valve disorder 04/27/2013    History of present pregnancy:  Last evaluation:  Seen for BP check 10/27: BP 149/94.  Provider Visit on 10/22 with J.Dusten Ellinwood, CNM in office.  BP 144/91 and 128/88. WT 202.9lbs, FHR 130, VE FT/ External OS 2-3/50/Ballotable    Nursing Staff Provider  Office Location  Ren Dating   1st trimester u/s  Language  English Anatomy US    Flu Vaccine   Genetic Screen  NIPS:  Don't repeat d/t twin demise AFP: not recommended per MFM    TDaP vaccine   04/05/20 Hgb A1C or  GTT Early  Third trimester   Rhogam     LAB RESULTS   Feeding Plan Breast Blood Type   O negative  Contraception IUD Antibody  negative  Circumcision If boy, yes Rubella  immune  Pediatrician  Undecided RPR   negative  Support Person Husband-John HBsAg   negative  Prenatal Classes Info given  HIV  negative  BTL Consent N/A GBS  (For PCN allergy, check sensitivities)   VBAC Consent N/A Pap  07/2019, normal per  patient, need records    Hgb Electro   horizon negative  BP Cuff Rx [facility] 10/24/19 CF horizon negative  Weight Scale Has at home SMA horizon negative    Waterbirth  [ ]  Class [ ]  Consent [ ]  CNM visit     OB History    Gravida  1   Para      Term      Preterm      AB      Living  0     SAB      TAB      Ectopic      Multiple      Live Births                Past Medical History:  Diagnosis Date  . Coarctation of aorta, congenital    surgical repair at 68 days old and 13 years   Past Surgical History:  Procedure Laterality Date  . BREAST ENHANCEMENT SURGERY    . COARCTATION OF AORTA REPAIR    . REFRACTIVE SURGERY    . TONSILLECTOMY     Family History: family history includes Heart failure in her maternal grandmother. Social History:  reports that she has never smoked. She has never used smokeless tobacco. She reports previous alcohol use of about 3.0 - 4.0 standard drinks of alcohol per week. She reports that she does not use drugs.  Prenatal Transfer Tool  Maternal Diabetes: No Genetic Screening: Abnormal:  Results: Other: Inconclusive  Maternal Ultrasounds/Referrals: Other: Marginal Cord Insertion/ Maternal Coarctation of Aorta Fetal Ultrasounds or other Referrals:  Referred to Materal Fetal Medicine  Maternal Substance Abuse:  No Significant Maternal Medications:  Meds include: Other: Magnesium, Aspirin Significant Maternal Lab Results: Group B Strep positive   Maternal Assessment:  ROS: -Contractions, -LOF, -Vaginal Bleeding, +Fetal Movement Patient denies HA, visual disturbances, RUQ pain, and overt edema  All other systems reviewed and negative.    Allergies  Allergen Reactions  . Sudafed [Pseudoephedrine]        Last menstrual period 09/04/2019.  Physical Exam Constitutional:      Appearance: Normal appearance.  HENT:     Head: Normocephalic and atraumatic.  Eyes:     Conjunctiva/sclera: Conjunctivae normal.   Cardiovascular:     Rate and Rhythm: Normal rate and regular rhythm.     Heart sounds: Normal heart sounds.  Pulmonary:     Effort: Pulmonary effort is normal. No respiratory distress.     Breath sounds: Normal breath sounds.  Musculoskeletal:        General: Normal range of motion.     Cervical back: Normal range of motion.  Skin:    General: Skin is warm and dry.  Neurological:     Mental Status: She is alert and oriented to person, place, and time.  Psychiatric:        Mood and Affect: Mood normal.        Behavior: Behavior normal.        Thought Content: Thought content normal.     Fetal Assessment: Leop[place]: -Pelvis:Adequate -EFW:  -Presentation: Cephalic  FHR: 145 bpm, Mod Var, -Decels, +Accels UCs:  Irregular    Assessment IUP at 37.6 weeks IOL for GHTN Maternal Coarctation of Aorta GBS Positive O Negative   Plan: Admit to [facility]  Routine Labor and Delivery Orders per Protocol Induction orders placed with Cytotec oral and Pitocin at 2x1 to be initiated with provider instruction.  In triage room to complete assessment and discuss POC: -Discussed r/b of induction including fetal distress, serial induction, pain, and increased risk of c/s delivery -Discussed induction methods including cervical ripening agents, foley bulbs, and pitocin -Patient verbalizes understanding and will go to Labor and Delivery. -Provider to bedside once patient settled for foley bulb placement.  -Okay for pushing with [facility], per Dr. Judeth Cornfield Consult on 12/01/2019 . -PreEclampsia Labs Ordered. -Labetalol protocol ordered for usage prn. -PCN ordered for GBS prophylaxis. -Rhogram to be given, after delivery, as appropriate.  Joellyn Quails, MSN 06/05/2020, 11:09 AM

## 2020-06-05 NOTE — MAU Note (Signed)
Pt had elevated B/P in office tody. Reports increased swelling in hands. Denies any H/A or visual changes. Good fetal movement reported. Denies any vag bleeding or leaking at this time.

## 2020-06-05 NOTE — Progress Notes (Signed)
Cassandra Hoover MRN: [id number]  Subjective: -Patient resting in bed.  Reports discomfort with contractions, but coping well with breathing techniques and husband support. Patient requesting IV pain medication.   Objective: BP (!) 154/85   Pulse 81   Temp 98.3 F (36.8 C) (Oral)   Resp 16   Ht 5\' 8"  (1.727 m)   Wt 92.8 kg   LMP 09/04/2019 (Exact Date)   BMI 31.09 kg/m  No intake/output data recorded. No intake/output data recorded.  Fetal Monitoring: FHT: 135 bpm, Mod Var, -Decels, +Accels UC: Q2-70min, palpates mild to moderate    Vaginal Exam: SVE:   Dilation: 2.5 (FT internally) Exam by:: Hershey Knauer, cnm Membranes:Intact Internal Monitors: None  Augmentation/Induction: Pitocin:None Cytotec: S/P Oral Dose Foley Bulb Remains in Place  Assessment:  IUP at 37.2 weeks Cat I FT  IOL for GHTN Pain  Plan: -Provider offers support at bedside. -Okay for IV pain medication as ordered. -Encouraged to rest when possible. -Continue other mgmt as ordered   [phone], CNM Advanced Practice Provider, Center for [facility] 06/05/2020, 3:30 PM

## 2020-06-05 NOTE — Progress Notes (Signed)
Patient ID: Cassandra Hoover, female   DOB: 10/18/1985, 34 y.o.   MRN: [id number]  In to introduce myself to pt; she is currently feeling well and is not bothered by contractions; PCN x 3 doses  BP 135/79, P 77 FHR 130s, +accels, no decels, Cat 1 Ctx q 1-2 mins with Pit at 48mu/min Cx deferred (was 5+/50 at start of Pit)  IUP@37 .2wks gHTN Cx favorable GBS pos  Plan to titrate Pit to keep ctx reg Plan to be patient with IOL process Anticipate vag del  Arabella Merles CNM 06/05/2020

## 2020-06-05 NOTE — Progress Notes (Signed)
   Subjective:  Cassandra Hoover is a 34 y.o. female here for BP check.   Hypertension ROS: no TIA's, no chest pain on exertion, no dyspnea on exertion, noting swelling of ankles, no orthostatic dizziness or lightheadedness, no orthopnea or paroxysmal nocturnal dyspnea and no palpitations.    Objective:  BP (!) 149/94 (BP Location: Left Arm, Patient Position: Sitting, Cuff Size: Large)   Pulse 82   Temp 98.2 F (36.8 C)   Wt 203 lb 12.8 oz (92.4 kg)   LMP 09/04/2019 (Exact Date)   BMI 30.99 kg/m   Appearance alert, well appearing, and in no distress and oriented to person, place, and time. General exam BP noted to be elevated today in office.    Assessment:   Blood Pressure asymptomatic and needs further observation.   Plan:  Consulted with Gerrit Heck, CNM; patient should go to MAU for direct admit. Labs cancelled.   Clovis Pu, RN

## 2020-06-05 NOTE — Progress Notes (Signed)
Cassandra Hoover MRN: [id number]  Subjective: -Provider to bedside.  Patient reports anxiety, but doing well.  Husband at bedside, supportive.   Objective: BP (!) 146/94   Pulse 82   Temp 98.3 F (36.8 C) (Oral)   Resp 18   Ht 5\' 8"  (1.727 m)   Wt 92.8 kg   LMP 09/04/2019 (Exact Date)   BMI 31.09 kg/m  No intake/output data recorded. No intake/output data recorded.  Fetal Monitoring: FHT: 145 bpm, Mod Var, -Decels, +Accels UC: Irregular    Vaginal Exam: SVE:   2.5 external, but internal FT Membranes:Intact Internal Monitors: None  Augmentation/Induction: Pitocin:None Cytotec: 09/06/2019 Oral Given Foley Bulb placed with speculum  Assessment:  IUP at 37.2 weeks Cat I FT  IOL s/t GHTN Cervical Ripening  Plan: -Discussed foley bulb insertion including r/b, placement, associated discomfort, cytotec usage, initiation of pitocin, and removal. -Patient without questions or concerns. -Foley bulb placed without difficulty.  Patient tolerated well.  -Will monitor and reassess as necessary.   , CNM Advanced Practice Provider, Center for [facility] 06/05/2020, 1:00 PM

## 2020-06-06 ENCOUNTER — Encounter (HOSPITAL_COMMUNITY): Payer: Self-pay | Admitting: Obstetrics & Gynecology

## 2020-06-06 ENCOUNTER — Inpatient Hospital Stay (HOSPITAL_COMMUNITY): Payer: BC Managed Care – PPO | Admitting: Anesthesiology

## 2020-06-06 DIAGNOSIS — O134 Gestational [pregnancy-induced] hypertension without significant proteinuria, complicating childbirth: Secondary | ICD-10-CM

## 2020-06-06 DIAGNOSIS — O99824 Streptococcus B carrier state complicating childbirth: Secondary | ICD-10-CM

## 2020-06-06 DIAGNOSIS — Z3A37 37 weeks gestation of pregnancy: Secondary | ICD-10-CM

## 2020-06-06 LAB — CBC
HCT: 36 % (ref 36.0–46.0)
Hemoglobin: 11.9 g/dL — ABNORMAL LOW (ref 12.0–15.0)
MCH: 30.7 pg (ref 26.0–34.0)
MCHC: 33.1 g/dL (ref 30.0–36.0)
MCV: 93 fL (ref 80.0–100.0)
Platelets: 203 10*3/uL (ref 150–400)
RBC: 3.87 MIL/uL (ref 3.87–5.11)
RDW: 13 % (ref 11.5–15.5)
WBC: 11.6 10*3/uL — ABNORMAL HIGH (ref 4.0–10.5)
nRBC: 0 % (ref 0.0–0.2)

## 2020-06-06 LAB — RPR: RPR Ser Ql: NONREACTIVE

## 2020-06-06 MED ORDER — ONDANSETRON HCL 4 MG PO TABS: 4.0000 mg | ORAL_TABLET | ORAL | Status: DC | PRN

## 2020-06-06 MED ORDER — EPHEDRINE 5 MG/ML INJ: 10.0000 mg | INTRAVENOUS | Status: DC | PRN

## 2020-06-06 MED ORDER — ACETAMINOPHEN 325 MG PO TABS: 650.0000 mg | ORAL_TABLET | ORAL | Status: DC | PRN

## 2020-06-06 MED ORDER — TETANUS-DIPHTH-ACELL PERTUSSIS 5-2.5-18.5 LF-MCG/0.5 IM SUSY: 0.5000 mL | PREFILLED_SYRINGE | Freq: Once | INTRAMUSCULAR | Status: DC

## 2020-06-06 MED ORDER — SENNOSIDES-DOCUSATE SODIUM 8.6-50 MG PO TABS
2.0000 | ORAL_TABLET | ORAL | Status: DC
  Administered 2020-06-06 – 2020-06-07 (×2): 2 via ORAL
  Filled 2020-06-06 (×2): qty 2

## 2020-06-06 MED ORDER — SIMETHICONE 80 MG PO CHEW: 80.0000 mg | CHEWABLE_TABLET | ORAL | Status: DC | PRN

## 2020-06-06 MED ORDER — IBUPROFEN 600 MG PO TABS
600.0000 mg | ORAL_TABLET | Freq: Four times a day (QID) | ORAL | Status: DC
  Administered 2020-06-06 – 2020-06-08 (×6): 600 mg via ORAL
  Filled 2020-06-06 (×7): qty 1

## 2020-06-06 MED ORDER — LIDOCAINE HCL (PF) 1 % IJ SOLN
INTRAMUSCULAR | Status: DC | PRN
  Administered 2020-06-06: 10 mL via EPIDURAL

## 2020-06-06 MED ORDER — LORATADINE 10 MG PO TABS
10.0000 mg | ORAL_TABLET | Freq: Every day | ORAL | Status: DC
  Filled 2020-06-06: qty 1

## 2020-06-06 MED ORDER — MISOPROSTOL 50MCG HALF TABLET
50.0000 ug | ORAL_TABLET | Freq: Once | ORAL | Status: DC
  Filled 2020-06-06: qty 1

## 2020-06-06 MED ORDER — DIPHENHYDRAMINE HCL 25 MG PO CAPS: 25.0000 mg | ORAL_CAPSULE | Freq: Four times a day (QID) | ORAL | Status: DC | PRN

## 2020-06-06 MED ORDER — DIBUCAINE (PERIANAL) 1 % EX OINT: 1.0000 "application " | TOPICAL_OINTMENT | CUTANEOUS | Status: DC | PRN

## 2020-06-06 MED ORDER — DIPHENHYDRAMINE HCL 50 MG/ML IJ SOLN: 12.5000 mg | INTRAMUSCULAR | Status: DC | PRN

## 2020-06-06 MED ORDER — LACTATED RINGERS IV SOLN: 500.0000 mL | Freq: Once | INTRAVENOUS | Status: DC

## 2020-06-06 MED ORDER — PHENYLEPHRINE 40 MCG/ML (10ML) SYRINGE FOR IV PUSH (FOR BLOOD PRESSURE SUPPORT): 80.0000 ug | PREFILLED_SYRINGE | INTRAVENOUS | Status: DC | PRN

## 2020-06-06 MED ORDER — COCONUT OIL OIL: 1.0000 "application " | TOPICAL_OIL | Status: DC | PRN

## 2020-06-06 MED ORDER — SODIUM CHLORIDE (PF) 0.9 % IJ SOLN
INTRAMUSCULAR | Status: DC | PRN
  Administered 2020-06-06: 12 mL/h via EPIDURAL

## 2020-06-06 MED ORDER — ONDANSETRON HCL 4 MG/2ML IJ SOLN: 4.0000 mg | INTRAMUSCULAR | Status: DC | PRN

## 2020-06-06 MED ORDER — PRENATAL MULTIVITAMIN CH
1.0000 | ORAL_TABLET | Freq: Every day | ORAL | Status: DC
  Administered 2020-06-07: 1 via ORAL
  Filled 2020-06-06 (×2): qty 1

## 2020-06-06 MED ORDER — BENZOCAINE-MENTHOL 20-0.5 % EX AERO
1.0000 "application " | INHALATION_SPRAY | CUTANEOUS | Status: DC | PRN
  Administered 2020-06-07: 1 via TOPICAL
  Filled 2020-06-06: qty 56

## 2020-06-06 MED ORDER — WITCH HAZEL-GLYCERIN EX PADS
1.0000 "application " | MEDICATED_PAD | CUTANEOUS | Status: DC | PRN
  Administered 2020-06-07 – 2020-06-08 (×2): 1 via TOPICAL

## 2020-06-06 MED ORDER — ZOLPIDEM TARTRATE 5 MG PO TABS: 5.0000 mg | ORAL_TABLET | Freq: Every evening | ORAL | Status: DC | PRN

## 2020-06-06 MED ORDER — FENTANYL-BUPIVACAINE-NACL 0.5-0.125-0.9 MG/250ML-% EP SOLN
12.0000 mL/h | EPIDURAL | Status: DC | PRN
  Filled 2020-06-06: qty 250

## 2020-06-06 NOTE — Anesthesia Preprocedure Evaluation (Addendum)
Anesthesia Evaluation  Patient identified by MRN, date of birth, ID band Patient awake    Reviewed: Allergy & Precautions, H&P , NPO status , Patient's Chart, lab work & pertinent test results, reviewed documented beta blocker date and time   Airway Mallampati: II  TM Distance: >3 FB Neck ROM: full    Dental no notable dental hx.    Pulmonary neg pulmonary ROS,    Pulmonary exam normal breath sounds clear to auscultation       Cardiovascular hypertension, negative cardio ROS Normal cardiovascular exam Rhythm:regular Rate:Normal     Neuro/Psych negative neurological ROS  negative psych ROS   GI/Hepatic negative GI ROS, Neg liver ROS,   Endo/Other  negative endocrine ROS  Renal/GU negative Renal ROS  negative genitourinary   Musculoskeletal negative musculoskeletal ROS (+)   Abdominal   Peds negative pediatric ROS (+)  Hematology negative hematology ROS (+)   Anesthesia Other Findings   Reproductive/Obstetrics negative OB ROS                            Anesthesia Physical Anesthesia Plan  ASA: II  Anesthesia Plan: Epidural   Post-op Pain Management:    Induction:   PONV Risk Score and Plan:   Airway Management Planned:   Additional Equipment:   Intra-op Plan:   Post-operative Plan:   Informed Consent: I have reviewed the patients History and Physical, chart, labs and discussed the procedure including the risks, benefits and alternatives for the proposed anesthesia with the patient or authorized representative who has indicated his/her understanding and acceptance.     Dental Advisory Given  Plan Discussed with: Anesthesiologist  Anesthesia Plan Comments: (Labs checked- platelets confirmed with RN in room. Fetal heart tracing, per RN, reported to be stable enough for sitting procedure. Discussed epidural, and patient consents to the procedure:  included risk of possible  headache,backache, failed block, allergic reaction, and nerve injury. This patient was asked if she had any questions or concerns before the procedure started.)        Anesthesia Quick Evaluation

## 2020-06-06 NOTE — Progress Notes (Signed)
Cassandra Hoover is a 34 y.o. G1P0 at [redacted]w[redacted]d admitted for IOL for gHTN.  Subjective: Pt on hands and knees in the bed, breathing through contractions. FOB at bedside for support. Pt much more uncomfortable, requesting cervical exam so she can make a decision about pain control methods.  Objective: BP (!) 147/81   Pulse 84   Temp 97.9 F (36.6 C) (Oral)   Resp 18   Ht 5\' 8"  (1.727 m)   Wt 204 lb 8 oz (92.8 kg)   LMP 09/04/2019 (Exact Date)   BMI 31.09 kg/m  I/O last 3 completed shifts: In: 431.8 [P.O.:400; I.V.:31.8] Out: -  No intake/output data recorded.  FHT:  FHR: 130 bpm, variability: moderate,  accelerations:  Present,  decelerations:  Absent UC:   regular, every 2-3 minutes SVE:   Dilation: 6 Effacement (%): 50 Station: -2 Exam by:: J Makya Phillis CNM  Labs: Lab Results  Component Value Date   WBC 7.9 06/05/2020   HGB 11.7 (L) 06/05/2020   HCT 36.3 06/05/2020   MCV 94.5 06/05/2020   PLT 212 06/05/2020    Assessment / Plan: Induction of labor due to gestational hypertension,  progressing well on pitocin  Labor: Progressing normally Preeclampsia:  no signs or symptoms of toxicity Fetal Wellbeing:  Category I Pain Control:  Planning epidural after repeat bloodwork I/D:  n/a Anticipated MOD:  NSVD  06/07/2020 06/06/2020, 2:15 PM

## 2020-06-06 NOTE — Anesthesia Postprocedure Evaluation (Signed)
Anesthesia Post Note  Patient: Cassandra Hoover  Procedure(s) Performed: AN AD HOC LABOR EPIDURAL     Patient location during evaluation: Mother Baby Anesthesia Type: Epidural Level of consciousness: awake and alert Pain management: pain level controlled Vital Signs Assessment: post-procedure vital signs reviewed and stable Respiratory status: spontaneous breathing, nonlabored ventilation and respiratory function stable Cardiovascular status: stable Postop Assessment: no headache, no backache and epidural receding Anesthetic complications: no   No complications documented.  Last Vitals:  Vitals:   06/06/20 2135 06/06/20 2230  BP: (!) 137/93 139/81  Pulse: 80 74  Resp: 16 18  Temp: 37.1 C     Last Pain:  Vitals:   06/06/20 2309  TempSrc:   PainSc: 0-No pain   Pain Goal:                   Coltan Spinello

## 2020-06-06 NOTE — Progress Notes (Signed)
Patient ID: Cassandra Hoover, female   DOB: [birth year], 34 y.o.   MRN: [id number]  Up walking to bathroom; still status quo with cramping/ctx (not feeling much); states she was able to rest  BPs 136/80, 140/80, P 73 FHR 130s, +accels, no decels, Cat 1 Ctx q 1-3 mins with Pit at 20mu/min Cx deferred  IUP@37 .3wks Cx favorable gHTN GBS pos  Reviewed plan of stopping Pitocin, giving a dose of cytotec buccally, showering/eating, and then restarting Pit with quick increase, and then AROM later today.  Arabella Merles CNM 06/06/2020

## 2020-06-06 NOTE — Progress Notes (Signed)
Cassandra Hoover came in to see pt; new orders to stop pitocin for 4 hours; give Cytotec per buccal now; may eat and shower and plan to restart pitocin in 4 hours. Explained to pt and pt ready and excited to eat. , understands plan

## 2020-06-06 NOTE — Progress Notes (Signed)
Patient ID: Cassandra Hoover, female   DOB: 1986/03/08, 34 y.o.   MRN: [id number]  Still feeling mostly comfortable but tired  BP 135/80, 147/83, P 74 FHR 150s, +accels, no decels, Cat 1 Ctx q 1-3 mins with Pit at 31mu/min Cx deferred  IUP@37 .3wks gHTN Cx favorable  Reviewed plan with pt to continue to increase Pit during the night; if by the morning she isn't in active labor/having cx progression, then plan to do a 4hr Pit break and allow to eat/shower. Will then restart Pit at a higher dose with regular increases and then plan to AROM. Pt and spouse in agreement and questions answered. Encouraged them to rest.  Arabella Merles CNM 06/06/2020 2:15 AM

## 2020-06-06 NOTE — Discharge Summary (Signed)
Postpartum Discharge Summary     Patient Name: Cassandra Hoover DOB: Mar 04, 1986 MRN: [id number]  Date of admission: 06/05/2020 Delivery date:06/06/2020  Delivering provider: Gavin Pound  Date of discharge: 06/08/2020  Admitting diagnosis: Gestational hypertension, third trimester [O13.3] Intrauterine pregnancy: [redacted]w[redacted]d    Secondary diagnosis:  Principal Problem:   Vaginal delivery Active Problems:   Coarctation of aorta   Rh negative state in antepartum period   Marginal insertion of umbilical cord affecting management of mother   Group beta Strep positive   Gestational hypertension, third trimester   Obstetric vaginal laceration with second degree perineal laceration  Additional problems: none    Discharge diagnosis: Term Pregnancy Delivered and Gestational Hypertension                                              Post partum procedures:rhogam Augmentation: AROM, Pitocin, Cytotec, and IP Foley Complications: None  Hospital course: Induction of Labor With Vaginal Delivery   34y.o. yo G1P1001 at 322w3das admitted to the hospital 06/05/2020 for induction of labor.  Indication for induction: Gestational hypertension.  Patient was admitted after elevated blood pressure check in the office on 10/27.  A FB was placed and oral cytotec given for induction initiation.  FB expelled after ~4 hours and after review of ctx pattern it was decided to initiate low dose pitocin.  She remained on this throughout the night and given a break with an additional cytotec at 0600.  At 1015 AROM was performed with pitocin restart and patient received epidural ~3 hours later.  She progressed to complete and delivered without issues.  See delivery note for complete summary.  Membrane Rupture Time/Date: 10:13 AM ,06/06/2020   Delivery Method:Vaginal, Spontaneous  Episiotomy: None  Lacerations:  2nd degree;Perineal  Details of delivery can be found in separate delivery note.  Patient had a postpartum  course remarkable for being started on Norvasc 57m31mue to mild range BPs. Patient is discharged home 06/08/20.  Newborn Data: Birth date:06/06/2020  Birth time:6:44 PM  Gender:Female -MicKelby Famving status:Living  Apgars:8 ,9  Weight:2656 g (5lb 13.7oz)  Magnesium Sulfate received: No BMZ received: No Rhophylac:Yes MMR:N/A T-DaP:Given prenatally Flu: No Transfusion:No  Physical exam  Vitals:   06/07/20 0949 06/07/20 1743 06/07/20 2009 06/08/20 0531  BP: 132/90 135/86 133/88 130/84  Pulse: 77 73 75 79  Resp: '17 14 15 16  ' Temp: 98.2 F (36.8 C) 97.8 F (36.6 C) 98.4 F (36.9 C) 98.2 F (36.8 C)  TempSrc: Oral Oral Oral   SpO2: 98% 100%  99%  Weight:      Height:       General: alert, cooperative and no distress Lochia: appropriate Uterine Fundus: firm Incision: N/A DVT Evaluation: No evidence of DVT seen on physical exam. No cords or calf tenderness. Calf/Ankle edema is present - trace pitting bilaterally Labs: Lab Results  Component Value Date   WBC 11.6 (H) 06/06/2020   HGB 11.9 (L) 06/06/2020   HCT 36.0 06/06/2020   MCV 93.0 06/06/2020   PLT 203 06/06/2020   CMP Latest Ref Rng & Units 06/05/2020  Glucose 70 - 99 mg/dL 68(L)  BUN 6 - 20 mg/dL 5(L)  Creatinine 0.44 - 1.00 mg/dL 0.65  Sodium 135 - 145 mmol/L 137  Potassium 3.5 - 5.1 mmol/L 3.9  Chloride 98 - 111 mmol/L 107  CO2 22 - 32 mmol/L 21(L)  Calcium 8.9 - 10.3 mg/dL 9.3  Total Protein 6.5 - 8.1 g/dL 6.6  Total Bilirubin 0.3 - 1.2 mg/dL 0.4  Alkaline Phos 38 - 126 U/L 140(H)  AST 15 - 41 U/L 21  ALT 0 - 44 U/L 14   Edinburgh Score: Edinburgh Postnatal Depression Scale Screening Tool 06/06/2020  I have been able to laugh and see the funny side of things. 0  I have looked forward with enjoyment to things. 0  I have blamed myself unnecessarily when things went wrong. 1  I have been anxious or worried for no good reason. 1  I have felt scared or panicky for no good reason. 0  Things have  been getting on top of me. 0  I have been so unhappy that I have had difficulty sleeping. 0  I have felt sad or miserable. 0  I have been so unhappy that I have been crying. 0  The thought of harming myself has occurred to me. 0  Edinburgh Postnatal Depression Scale Total 2     After visit meds:  Allergies as of 06/08/2020      Reactions   Sudafed [pseudoephedrine] Palpitations      Medication List    STOP taking these medications   aspirin EC 81 MG tablet   BENADRYL PO   miconazole 2 % vaginal cream Commonly known as: MONISTAT 7     TAKE these medications   acetaminophen 325 MG tablet Commonly known as: Tylenol Take 2 tablets (650 mg total) by mouth every 4 (four) hours as needed (for pain scale < 4).   amLODipine 5 MG tablet Commonly known as: NORVASC Take 1 tablet (5 mg total) by mouth daily.   Blood Pressure Monitor Automat Devi 1 Device by Does not apply route daily. Automatic blood pressure cuff regular size. To monitor blood pressure regularly at home. ICD-10 code: O09.90   D3-50 PO Take 2,000 Units by mouth daily.   fluticasone 50 MCG/ACT nasal spray Commonly known as: FLONASE Place into both nostrils daily as needed for allergies or rhinitis.   ibuprofen 600 MG tablet Commonly known as: ADVIL Take 1 tablet (600 mg total) by mouth every 6 (six) hours.   Loratadine 10 MG Caps Take by mouth.   Mag-Oxide 200 MG Tabs Generic drug: Magnesium Oxide Take 2 tablets (400 mg total) by mouth at bedtime. If that amount causes loose stools in the am, switch to 231m daily at bedtime. What changed: additional instructions   ONE A DAY PRENATAL PO Take 1 tablet by mouth daily.        Discharge home in stable condition Infant Feeding: Breast Infant Disposition:home with mother Discharge instruction: per After Visit Summary and Postpartum booklet. Activity: Advance as tolerated. Pelvic rest for 6 weeks.  Diet: routine diet Future Appointments: Future  Appointments  Date Time [place] 06/11/2020  9:50 AM [place]  07/12/2020 10:50 AM EGavin Pound CNM [facility] None   Follow up Visit: Message Sent 06/06/2020   Please schedule this patient for a In person postpartum visit in 4 weeks with the following provider:  JJanett Billow. Additional Postpartum F/U:BP check 1 week  High risk pregnancy complicated by: HTN Delivery mode:  Vaginal, Spontaneous  Anticipated Birth Control:  Unsure   06/08/2020 KMyrtis Ser CNM  9:25 AM

## 2020-06-06 NOTE — Progress Notes (Signed)
Cassandra Hoover MRN: [id number]  Subjective: -Patient resting in bed.  Reports she was able to eat breakfast and is very excited about this.  States she was able to get some sleep throughout the night.   Objective: BP (!) 150/82   Pulse 72   Temp 98.1 F (36.7 C) (Oral)   Resp 18   Ht 5\' 8"  (1.727 m)   Wt 92.8 kg   LMP 09/04/2019 (Exact Date)   BMI 31.09 kg/m  I/O last 3 completed shifts: In: 431.8 [P.O.:400; I.V.:31.8] Out: -  No intake/output data recorded.  Fetal Monitoring: FHT: 145 bpm, Mod Var, -Decels, +Accels UC: Palpates mild, Q1-34min    Vaginal Exam: SVE:   Dilation: 6 Effacement (%): 50 Station: -2 Exam by:: [email or web address] CNM Membranes:SROM with moderate amt clear fluid Internal Monitors: None  Augmentation/Induction: Pitocin:Initiated Cytotec: S/P 2 Doses S/P Foley Bulb  Assessment:  IUP at 37.3 Cat I FT  Amniotomy IOL s/t GHTN  Plan: -Discussed AROM r/b including increased risk of infection, cord prolapse, fetal intolerance, and decreased labor time. No questions or concerns and patient desires to proceed with AROM.  -Will restart pitocin at 2x1 as previously ordered. -Discussed pain control options available. -Nurse okay to call provider as necessary.  -Continue other mgmt as ordered.  Gerrit Heck, CNM Advanced Practice Provider, Center for [facility] 06/06/2020, 10:22 AM

## 2020-06-06 NOTE — Anesthesia Procedure Notes (Signed)
Epidural Patient location during procedure: OB Start time: 06/06/2020 3:32 PM End time: 06/06/2020 3:42 PM  Staffing Anesthesiologist: Lucretia Kern, MD Performed: anesthesiologist   Preanesthetic Checklist Completed: patient identified, IV checked, risks and benefits discussed, monitors and equipment checked, pre-op evaluation and timeout performed  Epidural Patient position: sitting Prep: DuraPrep Patient monitoring: heart rate, continuous pulse ox and blood pressure Approach: midline Location: L3-L4 Injection technique: LOR air  Needle:  Needle type: Tuohy  Needle gauge: 17 G Needle length: 9 cm Needle insertion depth: 7 cm Catheter type: closed end flexible Catheter size: 19 Gauge Catheter at skin depth: 12 cm Test dose: negative  Assessment Events: blood not aspirated, injection not painful, no injection resistance, no paresthesia and negative IV test  Additional Notes Reason for block:procedure for pain

## 2020-06-07 ENCOUNTER — Encounter: Payer: BC Managed Care – PPO | Admitting: Certified Nurse Midwife

## 2020-06-07 ENCOUNTER — Other Ambulatory Visit (HOSPITAL_COMMUNITY): Payer: Self-pay | Admitting: Obstetrics and Gynecology

## 2020-06-07 MED ORDER — AMLODIPINE BESYLATE 5 MG PO TABS
5.0000 mg | ORAL_TABLET | Freq: Every day | ORAL | Status: DC
  Administered 2020-06-07 – 2020-06-08 (×2): 5 mg via ORAL
  Filled 2020-06-07 (×2): qty 1

## 2020-06-07 MED ORDER — IBUPROFEN 600 MG PO TABS
600.0000 mg | ORAL_TABLET | Freq: Four times a day (QID) | ORAL | 0 refills | Status: DC
Start: 2020-06-07 — End: 2020-06-07

## 2020-06-07 MED ORDER — RHO D IMMUNE GLOBULIN 1500 UNIT/2ML IJ SOSY
300.0000 ug | PREFILLED_SYRINGE | Freq: Once | INTRAMUSCULAR | Status: AC
  Administered 2020-06-07: 300 ug via INTRAVENOUS
  Filled 2020-06-07: qty 2

## 2020-06-07 MED ORDER — AMLODIPINE BESYLATE 5 MG PO TABS: 5.0000 mg | ORAL_TABLET | Freq: Every day | ORAL | 0 refills | Status: DC

## 2020-06-07 MED ORDER — ACETAMINOPHEN 325 MG PO TABS
650.0000 mg | ORAL_TABLET | ORAL | 0 refills | Status: DC | PRN
Start: 2020-06-07 — End: 2020-06-07

## 2020-06-07 MED FILL — ACETAMINOPHEN 325 MG TABS: 325 | 3 days supply | Qty: 30 | Fill #0

## 2020-06-07 MED FILL — AMLODIPINE BESYLATE 5 MG TA: 5 | 30 days supply | Qty: 30 | Fill #0

## 2020-06-07 MED FILL — IBUPROFEN 600 MG TABLET: 600 | 7 days supply | Qty: 30 | Fill #0

## 2020-06-07 NOTE — Anesthesia Postprocedure Evaluation (Signed)
Anesthesia Post Note  Patient: Cassandra Hoover  Procedure(s) Performed: AN AD HOC LABOR EPIDURAL     Patient location during evaluation: Mother Baby Anesthesia Type: Epidural Level of consciousness: awake and alert Pain management: pain level controlled Vital Signs Assessment: post-procedure vital signs reviewed and stable Respiratory status: spontaneous breathing, nonlabored ventilation and respiratory function stable Cardiovascular status: stable Postop Assessment: no headache, no backache and epidural receding Anesthetic complications: no   No complications documented.  Last Vitals:  Vitals:   06/07/20 0200 06/07/20 0629  BP: 129/85 128/83  Pulse: 81 72  Resp: 16 18  Temp: 36.8 C 36.7 C    Last Pain:  Vitals:   06/07/20 0629  TempSrc: Oral  PainSc:    Pain Goal:                   Trellis Paganini

## 2020-06-07 NOTE — Progress Notes (Addendum)
POSTPARTUM PROGRESS NOTE  Post Partum Day 1  Subjective:  Cassandra Hoover is a 34 y.o. G1P1001 s/p SVD at 109w3d.  No acute events overnight.  Pt denies problems with ambulating, voiding or po intake.  She denies nausea or vomiting.  Pain is well controlled.  She has had flatus. She has not had bowel movement.  Lochia Small.   Objective: Blood pressure 128/83, pulse 72, temperature 98.1 F (36.7 C), temperature source Oral, resp. rate 18, height 5\' 8"  (1.727 m), weight 92.8 kg, last menstrual period 09/04/2019, unknown if currently breastfeeding.  Physical Exam:  General: alert, cooperative and no distress Chest: no respiratory distress Heart:regular rate  Abdomen: soft, nontender, fundus firm Uterine Fundus: firm, appropriately tender DVT Evaluation: No calf swelling or tenderness Extremities: bilateral 1+ pitting edema Skin: warm, dry  Recent Labs    06/05/20 1222 06/06/20 1402  HGB 11.7* 11.9*  HCT 36.3 36.0    Assessment/Plan: Cassandra Hoover is a 34 y.o. G1P1001 s/p SVD at 110w3d. PPD#1. Pt is doing quite well with no questions, complaints, or concerns.  PPD# 1 - Doing well, afebrile with good pain control. Contraception: Phexxi, to be started at postpartum visit Feeding: Breastfeeding well Dispo: Plan for discharge tomorrow. Gestational Hypertension: BP has been MR since delivery. Plan to start norvasc 5mg  with BP check in one week. Will continue to monitor. Denies HA, changes in vision, RUQ pain.   LOS: 2 days   [redacted]w[redacted]d, Medical Student 06/07/2020, 6:34 AM   I saw and evaluated the patient and repeated all pertinent parts of HPI. I agree with the findings and the plan of care as documented in the medical student's note.   Jori Moll, MD [facility] Fellow, [facility] for [facility], [facility]

## 2020-06-07 NOTE — Addendum Note (Signed)
Addendum  created 06/07/20 0751 by Trellis Paganini, CRNA   Clinical Note Signed

## 2020-06-07 NOTE — Lactation Note (Signed)
This note was copied from a baby's chart. Lactation Consultation Note  Patient Name: Cassandra Hoover IONGE'X Date: 06/07/2020 Reason for consult: Initial assessment;Early term 37-38.6wks P1, 8 hour ETI female infant. Per mom, infant has BF twice 7 to 8 minutes for each feeding.  Per mom, she is BF and supplementing infant with donor breast milk. Mom is concern she doesn't have any breast milk to give infant, LC discussed infant's tummy size is small and colostrum consistency. LC discussed hand expression and infant was given 3 mls of colostrum by spoon, mom was pleased to see than she has EBM to give infant. LC did not observe latch, infant was give 15 mls of donor breast milk. LC discussed mom BF infant according to cues, 8 to 12+ times within 24 hours, STS.   Per mom, she used DEBP once and she did see a few drops of colostrum, mom understands to pump every 3 hours for 15 minutes to help establish her milk supply. Mom knows to call RN or [facility] for assistance with latching infant at the breast if needed. Mom's plan: 1. BF infant according to cues, 8 to 12+ times within 24 hours, STS. 2. Dad will supplement infant with donor breast milk while mom is using the DEBP. 3. Mom will give infant back any EBM that is pumped or hand express first before offering donor breast milk.   Maternal Data Formula Feeding for Exclusion: No Has patient been taught Hand Expression?: Yes Does the patient have breastfeeding experience prior to this delivery?: No  Feeding Feeding Type: Donor Breast Milk Nipple Type: Slow - flow  LATCH Score                   Interventions Interventions: Breast feeding basics reviewed;Skin to skin;Breast massage;Hand express;Expressed milk;DEBP  Lactation Tools Discussed/Used WIC Program: No Pump Review: Setup, frequency, and cleaning;Milk Storage Initiated by:: RN Date initiated:: 06/07/20   Consult Status Consult Status: Follow-up Date:  06/07/20 Follow-up type: In-patient    Danelle Earthly 06/07/2020, 2:55 AM

## 2020-06-08 LAB — RH IG WORKUP (INCLUDES ABO/RH)
ABO/RH(D): O NEG
Fetal Screen: NEGATIVE
Gestational Age(Wks): 37.3
Unit division: 0

## 2020-06-08 NOTE — Discharge Instructions (Signed)

## 2020-06-08 NOTE — Lactation Note (Signed)
This note was copied from a Cassandra's chart. Lactation Consultation Note  Patient Name: Cassandra Hoover IWPYK'D Date: 06/08/2020  Cassandra Hoover now 63 hours old.  Being d/c today.  Birth weight less than 6 pounds and early term.  Parents have supplement amount sheet past breastfeeding.  Parents have been supplementing with Neosure infant formula. Discussed that since she was ealy term and less than 6 pounds could give a little more supplement if still hungry.  Reviewed LPTI guideliens sheet and supplement amount sheet as well.  Infant cuing on arrival. Mom reprots nipple soreness, Mom has very reddened nipple tips.  Urged hand express and rub expressed mothers milk on nipples and air dry.Mom reports she has had a hard time latching her and googled breastfeeding positions.  Mom rteports she found laid back breastfeeding and it seemed to work well.    Asked mom if LC could observe latch.  Infant very vigourous breastfeeder and coming off and on the breast.  Discussed supplementing at the breast.  Parents would like to try.  Used 5 french and syringe.  Infant quickly sucked down 12 ml of Neosure at the breast.  Refilled syringe with 8 more ml of Neosure and infant did not suck it by herself but still wanting to breastfeed so showed dad how to syringe it her mouth while breastfeeding/  After 8 ml finished infant still vreastfeeding.  Showed mom how to do hand pump on other breast.  Mom reprtos she has a DEBP/spectra at home for home [place].  Urged to feed on cue and 8-12 or more times day.  Urged to post pump and supplemt 8 times day.Discussed how if infant still taking a supplement once volume increases that it is easier to do with a bottle.  Parents in agreement.  Parents report they ike the syringe feeding at breast. Praised breastfeeding efforts.  Urged parentst to call lactation as needed.                Interventions    Lactation Tools Discussed/Used     Consult Status      Cassandra Hoover 06/08/2020, 12:11 PM

## 2020-06-10 LAB — SURGICAL PATHOLOGY

## 2020-06-11 ENCOUNTER — Ambulatory Visit (INDEPENDENT_AMBULATORY_CARE_PROVIDER_SITE_OTHER): Payer: BC Managed Care – PPO | Admitting: *Deleted

## 2020-06-11 VITALS — BP 137/91 | HR 79

## 2020-06-11 DIAGNOSIS — O165 Unspecified maternal hypertension, complicating the puerperium: Secondary | ICD-10-CM

## 2020-06-11 MED ORDER — AMLODIPINE BESYLATE 10 MG PO TABS: 10.0000 mg | ORAL_TABLET | Freq: Every day | ORAL | 1 refills | Status: DC

## 2020-06-11 NOTE — Progress Notes (Signed)
   Virtual Visit via Telephone Note  I connected with Cassandra Hoover on 06/11/20 at  9:50 AM EDT by telephone and verified that I am speaking with the correct person using two identifiers.  Location: Patient: Home Provider: [facility]   I discussed the limitations, risks, security and privacy concerns of performing an evaluation and management service by telephone and the availability of in person appointments. I also discussed with the patient that there may be a patient responsible charge related to this service. The patient expressed understanding and agreed to proceed.  Subjective:  Cassandra Hoover is a 34 y.o. female here for BP check.   Patient reported that she takes blood pressure medication every day at noon.  Hypertension ROS: taking medications as instructed, no medication side effects noted, home BP monitoring in range of 140s's systolic over 90's diastolic, no TIA's, no chest pain on exertion, no dyspnea on exertion, noting swelling of feet with prolong ambulation, no orthostatic dizziness or lightheadedness, no orthopnea or paroxysmal nocturnal dyspnea and no palpitations.    Objective:  BP (!) 147/92   Pulse 84   LMP 09/04/2019 (Exact Date)    Today's Vitals   06/11/20 0950 06/11/20 1002  BP: (!) 145/97 (!) 147/92  Pulse: 88 84   Appearance non face to face interview.  General exam BP noted to be elevated.    Assessment:   Blood Pressure no significant medication side effects noted, needs further observation and needs improvement.   Plan:  The following changes are to be made: Norvasc increased to 10 ml daily per Gerrit Heck, CNM. Pt to call clinic after lunch today for repeat blood pressure reading..  Follow Up Instructions:   I discussed the assessment and treatment plan with the patient. The patient was provided an opportunity to ask questions and all were answered. The patient agreed with the plan and demonstrated an understanding of the instructions.     The patient was advised to call back or seek an in-person evaluation if the symptoms worsen or if the condition fails to improve as anticipated.  I provided 10 minutes of non-face-to-face time during this encounter.   Clovis Pu, RN

## 2020-06-11 NOTE — Progress Notes (Signed)
   Virtual Visit via Telephone Note: BP Recheck  I connected with Cassandra Hoover on 06/11/20 at  9:50 AM EDT by telephone and verified that I am speaking with the correct person using two identifiers.  Location: Patient: Home Provider: [facility]   I discussed the limitations, risks, security and privacy concerns of performing an evaluation and management service by telephone and the availability of in person appointments. I also discussed with the patient that there may be a patient responsible charge related to this service. The patient expressed understanding and agreed to proceed.  Subjective:  Cassandra Hoover is a 34 y.o. female here for BP check.   Hypertension ROS: taking medications as instructed, no medication side effects noted, no TIA's, no chest pain on exertion, no dyspnea on exertion and noting swelling of feet with ambulation.    Objective:  BP (!) 137/91 (Patient Position: Sitting, Cuff Size: Normal)   Pulse 79   LMP 09/04/2019 (Exact Date)    Appearance non face to face.  General exam BP noted to be stable.    Assessment:   Blood Pressure improved.   Plan:  Current treatment plan is effective, no change in therapy..  Follow Up Instructions:   I discussed the assessment and treatment plan with the patient. The patient was provided an opportunity to ask questions and all were answered. The patient agreed with the plan and demonstrated an understanding of the instructions.   The patient was advised to call back or seek an in-person evaluation if the symptoms worsen or if the condition fails to improve as anticipated.  I provided 5 minutes of non-face-to-face time during this encounter.   Clovis Pu, RN

## 2020-07-01 ENCOUNTER — Telehealth: Payer: Self-pay | Admitting: *Deleted

## 2020-07-01 NOTE — Telephone Encounter (Signed)
Patient called requesting a provider sign a declaration of birth form. She also has complaints of right breast pain/burning and itching. Patient think is could be thrush. Staff message sent to Edd Arbour, CNM and Gerrit Heck, CNM.   Clovis Pu, RN

## 2020-07-02 ENCOUNTER — Telehealth: Payer: Self-pay | Admitting: Certified Nurse Midwife

## 2020-07-02 DIAGNOSIS — B379 Candidiasis, unspecified: Secondary | ICD-10-CM

## 2020-07-02 DIAGNOSIS — M6289 Other specified disorders of muscle: Secondary | ICD-10-CM

## 2020-07-02 MED ORDER — NYSTATIN-TRIAMCINOLONE 100000-0.1 UNIT/GM-% EX OINT: 1.0000 "application " | TOPICAL_OINTMENT | Freq: Two times a day (BID) | CUTANEOUS | 0 refills | Status: DC

## 2020-07-02 NOTE — Telephone Encounter (Signed)
Pt called in to office yesterday complaining of burning/itching on one breast. Stopped latching baby on that side and has been pumping. Micolog cream sent to pharmacy.  Pt also complained of feeling like something is going to "fall out" of her vagina, will discuss at her PP visit next week but made referral to pelvic floor physical therapy today.   Edd Arbour, CNM, MSN, [facility] 07/02/20 1:01 PM

## 2020-07-11 ENCOUNTER — Encounter: Payer: Self-pay | Admitting: General Practice

## 2020-07-12 ENCOUNTER — Other Ambulatory Visit: Payer: Self-pay

## 2020-07-12 ENCOUNTER — Other Ambulatory Visit: Payer: Self-pay | Admitting: *Deleted

## 2020-07-12 ENCOUNTER — Ambulatory Visit (INDEPENDENT_AMBULATORY_CARE_PROVIDER_SITE_OTHER): Payer: BC Managed Care – PPO

## 2020-07-12 DIAGNOSIS — O165 Unspecified maternal hypertension, complicating the puerperium: Secondary | ICD-10-CM

## 2020-07-12 DIAGNOSIS — Z3009 Encounter for other general counseling and advice on contraception: Secondary | ICD-10-CM

## 2020-07-12 MED ORDER — AMLODIPINE BESYLATE 10 MG PO TABS: 10.0000 mg | ORAL_TABLET | Freq: Every day | ORAL | 1 refills | Status: DC

## 2020-07-12 MED ORDER — PHEXXI 1.8-1-0.4 % VA GEL: 1.0000 | VAGINAL | 2 refills | Status: DC | PRN

## 2020-07-12 NOTE — Progress Notes (Signed)
Post Partum Visit Note  Cassandra Hoover is a 34 y.o. G46P1001 female who presents for a postpartum visit. She is 5 weeks postpartum following a normal spontaneous vaginal delivery.  I have fully reviewed the prenatal and intrapartum course. The delivery was at 37.3 gestational weeks.  Anesthesia: epidural. Postpartum course has been uncomplicated. Baby is doing well and has "grown so much." Baby is feeding by breast. Bleeding no bleeding. Bowel function is normal. Bladder function is normal. Patient is not sexually active. Contraception method is none. Postpartum depression screening: negative.  Patient reports she is doing well and feels she is transitioning into motherhood well.  She reports getting 8-9 hours of sleep at night and receives support from her husband and mother.  Patient expresses desire for non-hormonal birth control method, but has some interest in [place] which she has used in the past.  Patient reports that breastfeeding is going well, but that she had a "bleb" on her breast that arrived after diagnosis of yeast.  Patient also reports that her cervix was visible after a bowel movement and informed another provider and is scheduled for PT.  Patient reports that she has been performing Kegel exercises in the interim.   The pregnancy intention screening data noted above was reviewed. Potential methods of contraception were discussed. The patient elected to proceed with Unknown/Not Reported.    The following portions of the patient's history were reviewed and updated as appropriate: allergies, current medications, past family history, past medical history, past social history, past surgical history and problem list.  Review of Systems Pertinent items noted in HPI and remainder of comprehensive ROS otherwise negative.    Objective:  BP 126/82 (BP Location: Left Arm, Patient Position: Sitting, Cuff Size: Normal)   Pulse 84   Temp 97.8 F (36.6 C) (Oral)   Wt 182 lb (82.6 kg)   LMP  09/04/2019 (Exact Date)   Breastfeeding Yes   BMI 27.67 kg/m    General:  alert, cooperative and no distress   Breasts:  inspection negative, no nipple discharge or bleeding, no masses or nodularity palpable.  No apparent "bleb," blisters, or clogged ducts on right breast.   Lungs: clear to auscultation bilaterally  Heart:  regular rate and rhythm  Abdomen: soft, non-tender; bowel sounds normal; no masses,  no organomegaly   Vulva:  not evaluated  Vagina: not evaluated  Cervix:  Not Evaluated  Corpus: not examined  Adnexa:  not evaluated  Rectal Exam: Not performed.        Assessment:   5 weeks postpartum exam  Pap smear UTD Non-Hormonal BCM Aortic Valve Disorder Plan:   -Reviewed return to sexual and other activities.  Encouraged usage of lubrication while breastfeeding. -Instructed to continue PNV while breastfeeding. -Discussed blood pressures.  Instructed to discontinue Norvasc and follow up with PCP considering h/o aortic valve disorder.  -Reviewed usage of Phexxi.  Reiterated need to insert new applicator with every sexual encounter. -Encouraged to return if desires Mirena. -Discussed desires for future in the future.  Support given.  -RTO as needed.   Essential components of care per ACOG recommendations:  1.  Mood and well being: Patient with negative depression screening today. Reviewed local resources for support.  - Patient does not use tobacco.  - hx of drug use? No    2. Infant care and feeding:  -Patient currently breastmilk feeding? Yes If breastmilk feeding discussed return to work and pumping. If needed, patient was provided letter for work to  allow for every 2-3 hr pumping breaks, and to be granted a private location to express breastmilk and refrigerated area to store breastmilk. Reviewed importance of draining breast regularly to support lactation. -Social determinants of health (SDOH) reviewed in EPIC. No concerns  3. Sexuality, contraception and birth  spacing - Patient does not want a pregnancy in the next year.  Desired family size is unsure children.  - Reviewed forms of contraception in tiered fashion. Patient desired Phexxi today.   - Discussed birth spacing of 18 months  4. Sleep and fatigue -Encouraged family/partner/community support of 4 hrs of uninterrupted sleep to help with mood and fatigue  5. Physical Recovery  - Discussed patients delivery and complications.  "Still can't believe that I did that!"  - Patient had a 2nd degree laceration, perineal healing reviewed. Patient expressed understanding - Patient has urinary incontinence? No Patient was referred to pelvic floor PT  - Patient is safe to resume physical and sexual activity  6.  Health Maintenance - Last pap smear done 07/2019 and was normal with negative HPV. No Mammogram d/t age  34. Chronic Disease -BP remain slightly elevated despite Norvasc dosing.  -Still taking Norvasc dosing.  -Instructed to discontinue and follow up with PCP as scheduled.   [name], CNM Center for [employer], [facility]

## 2020-07-13 NOTE — Patient Instructions (Signed)
Lactic acid; Citric acid; Potassium bitartrate vaginal gel What is this medicine? LACTIC ACID; CITRIC ACID; POTASSIUM BITARTRATE (LAK tuhk AS id; SIH trik AS id; poe TASS ee um bi TAAR treit) is used for birth control when you have sexual intercourse to help prevent pregnancy. This medicine may be used for other purposes; ask your health care provider or pharmacist if you have questions. COMMON BRAND NAME(S): PHEXXI What should I tell my health care provider before I take this medicine? They need to know if you have any of these conditions:  frequent kidney or urinary tract infections  HIV or AIDS  pelvic or vaginal infection  sexually transmitted disease, like herpes, gonorrhea, or chlamydia  an unusual or allergic reaction to lactic acid, citric acid, potassium bitartrate, other medicines, foods, dyes, or preservatives  pregnant or trying to get pregnant  breast-feeding How should I use this medicine? This medicine is for use in the vagina only. Follow the directions on the prescription label. Wash hands before and after use. To prevent pregnancy, it is very important that this product is used properly. This product must be applied before sexual contact begins. Do not use it in the rectum. Make sure you carefully read and follow the instructions that come with the product. If you have vaginal sex more than 1 time within 1 hour, you must use a new applicator. Use exactly as directed. Talk to your pediatrician regarding the use of this medicine in children. Special care may be needed. Overdosage: If you think you have taken too much of this medicine contact a poison control center or emergency room at once. NOTE: This medicine is only for you. Do not share this medicine with others. What if I miss a dose? Use before each time you have sexual intercourse as directed on the label. If you miss a dose, you may become pregnant. What may interact with this medicine? Interactions are not  expected. This birth control may be used with other medicines used in the vagina to treat infections including miconazole, metronidazole, and tioconazole. If you have questions ask your doctor or pharmacist. Do not use any other vaginal products at the same time without talking to your health care professional. This list may not describe all possible interactions. Give your health care provider a list of all the medicines, herbs, non-prescription drugs, or dietary supplements you use. Also tell them if you smoke, drink alcohol, or use illegal drugs. Some items may interact with your medicine. What should I watch for while using this medicine? This product does not protect you against HIV infection (AIDS) or other sexually transmitted diseases. This product may be used at any time of your menstrual cycle. This product may be used as soon as your healthcare provider tells you it is safe for you to have sex after childbirth, abortion, or miscarriage. This product may be used with most hormonal birth control methods; a vaginal diaphragm; or latex, polyurethane and polyisoprene condoms. Avoid using this product with contraceptive vaginal rings. What side effects may I notice from receiving this medicine? Side effects that you should report to your doctor or health care professional as soon as possible:  allergic reactions like skin rash, itching or hives, swelling  signs and symptoms of infection like fever; chills; pelvic pain; cloudy urine; pain or trouble passing urine  vaginal discharge, itching or odor  vaginal irritation or burning Side effects that usually do not require medical attention (report these to your doctor or health care professional   if they continue or are bothersome):  mild vaginal irritation This list may not describe all possible side effects. Call your doctor for medical advice about side effects. You may report side effects to FDA at 1-800-FDA-1088. Where should I keep my  medicine? Keep out of the reach of children. Store at room temperature between 15 and 30 degrees C (59 and 86 degrees F). Keep in the foil pouch until ready for use. Follow the directions on the product label. Throw away any unused medicine after the expiration date. NOTE: This sheet is a summary. It may not cover all possible information. If you have questions about this medicine, talk to your doctor, pharmacist, or health care provider.  2020 Elsevier/Gold Standard (2019-01-05 09:34:36)  

## 2020-08-16 ENCOUNTER — Ambulatory Visit: Payer: BC Managed Care – PPO | Attending: Certified Nurse Midwife | Admitting: Physical Therapy

## 2020-08-16 ENCOUNTER — Other Ambulatory Visit: Payer: Self-pay

## 2020-08-16 DIAGNOSIS — R278 Other lack of coordination: Secondary | ICD-10-CM | POA: Insufficient documentation

## 2020-08-16 DIAGNOSIS — M6281 Muscle weakness (generalized): Secondary | ICD-10-CM | POA: Insufficient documentation

## 2020-08-16 NOTE — Patient Instructions (Addendum)
Moisturizers . They are used in the vagina to hydrate the mucous membrane that make up the vaginal canal. . Designed to keep a more normal acid balance (ph) . Once placed in the vagina, it will last between two to three days.  . Use 2-3 times per week at bedtime  . Ingredients to avoid is glycerin and fragrance, can increase chance of infection . Should not be used just before sex due to causing irritation . Most are gels administered either in a tampon-shaped applicator or as a vaginal suppository. They are non-hormonal.   Types of Moisturizers(internal use)  . Vitamin E vaginal suppositories- Whole foods, Amazon . Moist Again . Coconut oil- can break down condoms . Julva- (Do no use if on Tamoxifen) amazon . Yes moisturizer- amazon . NeuEve Silk , NeuEve Silver for menopausal or over 65 (if have severe vaginal atrophy or cancer treatments use NeuEve Silk for  1 month than move to [employer])- [employer], ShapeConsultant.com.cy . Olive and Bee intimate cream- www.oliveandbee.com.au . Mae vaginal moisturizer- Amazon . Aloe .    Creams to use externally on the Vulva area  Marathon Oil (good for for cancer patients that had radiation to the area)- [place] or [employer].https://garcia-valdez.org/  V-magic cream - amazon  Julva-amazon  Vital "V Wild Yam salve ( help moisturize and help with thinning vulvar area, does have Beeswax  MoodMaid Botanical Pro-Meno Wild Yam Cream- Amazon  Desert Harvest Gele  Cleo by Zane Herald labial moisturizer (Amazon,   Coconut or olive oil  aloe   Things to avoid in the vaginal area . Do not use things to irritate the vulvar area . No lotions just specialized creams for the vulva area- Neogyn, V-magic, No soaps; can use Aveeno or Calendula cleanser if needed. Must be gentle . No deodorants . No douches . Good to sleep without underwear to let the vaginal area to air out . No scrubbing: spread the lips to let warm water rinse over labias and pat dry  2 times per  week massage the perineum with massaging the perineal body, place thumb internally and stroke left side from 1:00 to 6:00 15x, then twirl the area between the anus and vagina using coconut oit  Place hands on the rib cage, as you breath in feel the lower rib cage expand then as you breath out pull the rib cage downward.   [facility] [address], [address] [place], [place] [address] Phone # [phone] Fax [phone]

## 2020-08-16 NOTE — Therapy (Signed)
[facility] [address], [address] [place], [place], [address] Phone: [phone]   Fax:  [phone]  Physical Therapy Evaluation  Patient Details  Name: Cassandra Hoover MRN: [id number] Date of Birth: Dec 26, 1985 Referring Provider (PT): Dr. Edd Arbour   Encounter Date: 08/16/2020   PT End of Session - 08/16/20 1022    Visit Number 1    Date for PT Re-Evaluation 11/08/20    Authorization Type BCBS    PT Start Time 0930    PT Stop Time 1015    PT Time Calculation (min) 45 min    Activity Tolerance Patient tolerated treatment well    Behavior During Therapy [facility] for tasks assessed/performed           Past Medical History:  Diagnosis Date  . Coarctation of aorta 05/17/2013   Since childhood, Coarct 1.7cmX1.8cm  . Coarctation of aorta, congenital    surgical repair at 33 days old and 13 years  . Gestational hypertension, third trimester 06/05/2020  . Obstetric vaginal laceration with second degree perineal laceration 06/06/2020  . Rh negative state in antepartum period 11/27/2019    Past Surgical History:  Procedure Laterality Date  . BREAST ENHANCEMENT SURGERY    . COARCTATION OF AORTA REPAIR    . REFRACTIVE SURGERY    . TONSILLECTOMY      There were no vitals filed for this visit.    Subjective Assessment - 08/16/20 0940    Subjective Patient reports 3-4 weeks after giving birth had a something is in the vaginal canal. It does not seem to be happening at this time.    Patient Stated Goals assess for pelvic floor laxity    Currently in Pain? No/denies    Multiple Pain Sites No              [facility] PT Assessment - 08/16/20 0001      Assessment   Medical Diagnosis M62.89 pelvic floor instability    Referring Provider (PT) Dr. Edd Arbour    Onset Date/Surgical Date 06/06/20    Prior Therapy none      Precautions   Precautions None      Restrictions   Weight Bearing Restrictions No      Balance  Screen   Has the patient fallen in the past 6 months No    Has the patient had a decrease in activity level because of a fear of falling?  No    Is the patient reluctant to leave their home because of a fear of falling?  No      Home [employer] information [employer] manager residence      Prior Function   Level of Independence Independent      Cognition   Overall Cognitive Status Within Functional Limits for tasks assessed      Posture/Postural Control   Posture/Postural Control No significant limitations      ROM / Strength   AROM / PROM / Strength AROM;PROM;Strength      AROM   Overall AROM Comments Lumbar ROM is full      Strength   Overall Strength Comments bilateral hip strength 4/5      Palpation   SI assessment  left ASIS is higher                      Objective measurements completed on examination: See above findings.     Pelvic Floor Special Questions - 08/16/20 0001  Prior Pregnancies Yes    Number of Vaginal Deliveries 1   second degree tear   Diastasis Recti 1 finger width above and below the umbilicus    Currently Sexually Active No    Marinoff Scale no problems    Urinary Leakage No    Urinary urgency No    Fecal incontinence --   bowel movement are not as frequent   Skin Integrity Intact    Pelvic Floor Internal Exam Patient confimrs identification and approves PT to assess the pelvic floor and treatment    Exam Type Vaginal    Palpation not as much contraction on the left side; unabl eot bulge the pelvic floor; tenderness located on the perineal body and decreased mobility    Strength fair squeeze, definite lift   2/5 on left                   PT Education - 08/16/20 1023    Education Details education on vaginal lubricants; massaging the perineal body, rib mobility    Person(s) Educated Patient    Methods Explanation;Demonstration;Handout    Comprehension Returned demonstration;Verbalized understanding             PT Short Term Goals - 08/16/20 1026      PT SHORT TERM GOAL #1   Title independent with perineal massage to improve tissue mobiltiy    Time 4    Period Weeks    Status New    Target Date 09/13/20      PT SHORT TERM GOAL #2   Title understand lubricants that are good for vaginal health    Time 4    Period Weeks    Status New    Target Date 09/13/20             PT Long Term Goals - 08/16/20 1027      PT LONG TERM GOAL #1   Title independent with core exercises with correct abdominal engagement    Time 12    Period Weeks    Target Date 11/08/20      PT LONG TERM GOAL #2   Title able to bulge the pelvic floor to elongate the tissue for bowel movements to not strain the pelvic floor    Time 12    Period Weeks    Status New    Target Date 11/08/20      PT LONG TERM GOAL #3   Title able to make a full circular contraction of the pelvic floor to engage the core correctly    Time 12    Status New    Target Date 11/08/20                  Plan - 08/16/20 1024    Clinical Impression Statement Patient had her little girl on 06/06/2020 vaginally with a second degree tear. Patient is not having pain at this time. She has to use a squatty potty and stool softners to have a bowel movement daily. Pelvic floor strength on the right is 3/5 and left is 2/5. Patient has tenderness located on the perineal body and decreased mobility where she had her tear. Patient is not able to bulge her pelvic floor. Bilateral hip strength is 4/5. Diastasis Recti 1 finger width above and below the umbilicus. Decreased mobility of the lower rib cage. Difficulty engaging the lower abdominals. Right ilium is anteriorly rotated. Patient will benefit from skilled therapy to improve strength and coordination of the pelvic floor.  Personal Factors and Comorbidities Fitness    Examination-Participation Restrictions Community Activity    Stability/Clinical Decision Making Stable/Uncomplicated    Clinical  Decision Making Low    Rehab Potential Excellent    PT Frequency 1x / week    PT Duration 12 weeks   every other week for 4 sessions but schedule may make it difficult   PT Treatment/Interventions Neuromuscular re-education;Therapeutic exercise;Therapeutic activities;Patient/family education;Manual techniques;Scar mobilization;Biofeedback    PT Next Visit Plan engaging abdominals, lower rib mobilty, review material from last visit, correct right ilium, manual work to pelvic floor, diaphragmatic breathing, manual work from back to abdomen    Consulted and Agree with Plan of Care Patient           Patient will benefit from skilled therapeutic intervention in order to improve the following deficits and impairments:  Decreased coordination,Increased fascial restricitons,Decreased activity tolerance,Decreased strength,Decreased scar mobility,Increased muscle spasms  Visit Diagnosis: Muscle weakness (generalized) - Plan: PT plan of care cert/re-cert  Other lack of coordination - Plan: PT plan of care cert/re-cert     Problem List Patient Active Problem List   Diagnosis Date Noted  . Coarctation of aorta, congenital   . Aortic valve disorder 04/27/2013   Earlie Counts, PT 08/16/20 10:42 AM   [facility] [address], [place], [place], [address] Phone: [phone]   Fax:  [phone]  Name: Cassandra Hoover MRN: [id number] Date of Birth: [birth year]

## 2020-09-03 ENCOUNTER — Encounter: Payer: BC Managed Care – PPO | Admitting: Physical Therapy

## 2020-09-10 ENCOUNTER — Encounter: Payer: BC Managed Care – PPO | Admitting: Physical Therapy

## 2020-09-17 ENCOUNTER — Other Ambulatory Visit: Payer: Self-pay

## 2020-09-17 ENCOUNTER — Encounter: Payer: BC Managed Care – PPO | Attending: Certified Nurse Midwife | Admitting: Physical Therapy

## 2020-09-17 ENCOUNTER — Encounter: Payer: Self-pay | Admitting: Physical Therapy

## 2020-09-17 DIAGNOSIS — R278 Other lack of coordination: Secondary | ICD-10-CM | POA: Insufficient documentation

## 2020-09-17 DIAGNOSIS — M6281 Muscle weakness (generalized): Secondary | ICD-10-CM

## 2020-09-17 NOTE — Patient Instructions (Signed)
Access Code: [email or web address] URL: [email or web address] Date: 09/17/2020 Prepared by: Eulis Foster  Exercises Thoracic Mobilization on Foam Roll - 1 x daily - 3 x weekly - 1 sets - 10 reps Thoracic Extension Mobilization on Foam Roll - 1 x daily - 3 x weekly - 1 sets - 10 reps Thoracic Foam Roll Mobilization Backstroke - 1 x daily - 3 x weekly - 1 sets - 10 reps Supine Chest Stretch on Foam Roll - 1 x daily - 3 x weekly - 1 sets - 10 reps Thoracic Extension with Foam Roll - 1 x daily - 3 x weekly - 1 sets - 10 reps Supine Lower Trunk Rotation with Swiss Ball - 1 x daily - 3 x weekly - 1 sets - 10 reps Bridge with Arms at [employer] and Feet on [employer] - 1 x daily - 3 x weekly - 1 sets - 10 reps Supine Hip and Knee Flexion AROM with Swiss Ball - 1 x daily - 3 x weekly - 1 sets - 10 reps Prone Lower Trapezius with Legs Straight on Swiss Ball - 1 x daily - 7 x weekly - 3 sets - 10 reps Prone Middle Trapezius with Legs Straight on Swiss Ball - 1 x daily - 7 x weekly - 3 sets - 10 reps Prone Trunk Extension on [employer] with Baby - 1 x daily - 7 x weekly - 3 sets - 10 reps Prone Shoulder Flexion on Swiss Ball with Baby - 1 x daily - 7 x weekly - 3 sets - 10 reps Thoracic Sidebending on Swiss Ball - 1 x daily - 7 x weekly - 3 sets - 10 reps Seated Swiss Ball Pelvic Circles - 1 x daily - 7 x weekly - 3 sets - 10 reps Seated Swiss Ball Lateral Pelvic Tilts - 1 x daily - 7 x weekly - 3 sets - 10 reps Supine to Sit on Therapy Ball - 1 x daily - 7 x weekly - 3 sets - 10 reps [facility] [address], [address] [place], [place] [address] Phone # [phone] Fax [phone]

## 2020-09-17 NOTE — Therapy (Signed)
[place] [facility] [address], [place], [place], [address] Phone: [phone]   Fax:  [phone]  Physical Therapy Treatment  Patient Details  Name: Cassandra Hoover MRN: [id number] Date of Birth: 1986/05/03 Referring Provider (PT): Dr. Gaylan Gerold   Encounter Date: 09/17/2020   PT End of Session - 09/17/20 1348    Visit Number 2    Date for PT Re-Evaluation 11/08/20    Authorization Type BCBS    PT Start Time 1400    PT Stop Time 1440    PT Time Calculation (min) 40 min    Activity Tolerance Patient tolerated treatment well    Behavior During Therapy [facility] for tasks assessed/performed           Past Medical History:  Diagnosis Date  . Coarctation of aorta 05/17/2013   Since childhood, Coarct 1.7cmX1.8cm  . Coarctation of aorta, congenital    surgical repair at 18 days old and 13 years  . Gestational hypertension, third trimester 06/05/2020  . Obstetric vaginal laceration with second degree perineal laceration 06/06/2020  . Rh negative state in antepartum period 11/27/2019    Past Surgical History:  Procedure Laterality Date  . BREAST ENHANCEMENT SURGERY    . COARCTATION OF AORTA REPAIR    . REFRACTIVE SURGERY    . TONSILLECTOMY      There were no vitals filed for this visit.   Subjective Assessment - 09/17/20 1307    Subjective I have changed my lubricant that is helping alot. I am not having the vaginal tightness and bowel movements are doing better. I am doing my yoga now.    Patient Stated Goals assess for pelvic floor laxity    Currently in Pain? No/denies              [facility] PT Assessment - 09/17/20 0001      Palpation   SI assessment  ASIS are equal            Access Code: [email or web address] URL: [email or web address] Date: 09/17/2020 Prepared by: Earlie Counts  Exercises Thoracic Mobilization on Foam Roll - 1 x daily - 3 x weekly - 1 sets - 10 reps Thoracic Extension Mobilization on  Foam Roll - 1 x daily - 3 x weekly - 1 sets - 10 reps Thoracic Foam Roll Mobilization Backstroke - 1 x daily - 3 x weekly - 1 sets - 10 reps Supine Chest Stretch on Foam Roll - 1 x daily - 3 x weekly - 1 sets - 10 reps Thoracic Extension with Foam Roll - 1 x daily - 3 x weekly - 1 sets - 10 reps Supine Lower Trunk Rotation with Swiss Ball - 1 x daily - 3 x weekly - 1 sets - 10 reps Bridge with Arms at [employer] and Feet on [employer] - 1 x daily - 3 x weekly - 1 sets - 10 reps Supine Hip and Knee Flexion AROM with Swiss Ball - 1 x daily - 3 x weekly - 1 sets - 10 reps Prone Lower Trapezius with Legs Straight on Swiss Ball - 1 x daily - 7 x weekly - 3 sets - 10 reps Prone Middle Trapezius with Legs Straight on Swiss Ball - 1 x daily - 7 x weekly - 3 sets - 10 reps Prone Trunk Extension on [employer] with Baby - 1 x daily - 7 x weekly - 3 sets - 10 reps Prone Shoulder Flexion on Swiss Ball with Baby - 1  x daily - 7 x weekly - 3 sets - 10 reps Thoracic Sidebending on Swiss Ball - 1 x daily - 7 x weekly - 3 sets - 10 reps Seated Swiss Ball Pelvic Circles - 1 x daily - 7 x weekly - 3 sets - 10 reps Seated Swiss Ball Lateral Pelvic Tilts - 1 x daily - 7 x weekly - 3 sets - 10 reps Supine to Sit on Therapy Ball - 1 x daily - 7 x weekly - 3 sets - 10 reps            Pelvic Floor Special Questions - 09/17/20 0001    Urinary Leakage No                     PT Education - 09/17/20 1347    Education Details Access Code: [email or web address]    Person(s) Educated Patient    Methods Explanation;Demonstration;Handout    Comprehension Returned demonstration;Verbalized understanding            PT Short Term Goals - 09/17/20 1347      PT SHORT TERM GOAL #1   Title independent with perineal massage to improve tissue mobiltiy    Time 4    Period Weeks    Status Achieved      PT SHORT TERM GOAL #2   Title understand lubricants that are good for vaginal health    Time 4    Period Weeks     Status Achieved    Target Date 09/13/20             PT Long Term Goals - 09/17/20 1347      PT LONG TERM GOAL #1   Title independent with core exercises with correct abdominal engagement    Time 12    Period Weeks    Status Achieved      PT LONG TERM GOAL #2   Title able to bulge the pelvic floor to elongate the tissue for bowel movements to not strain the pelvic floor    Time 12    Period Weeks    Status Achieved      PT LONG TERM GOAL #3   Title able to make a full circular contraction of the pelvic floor to engage the core correctly    Time 12    Period Weeks    Status Achieved                 Plan - 09/17/20 1348    Clinical Impression Statement Patient has achieved all goals. She understands her HEP and how to incorporate her infant in the exercises. Patient pelvis in correct alignment. She is able to have penile penetration vaginally without pain and understands what lubricants are appropriate. Patient is doing her perineal massage but is not feeling tightness anymore. She is not having difficulty with bowel movements due to ability to bulge the pelvic floor. Patient is ready for discharge to HEP.    Personal Factors and Comorbidities Fitness    Examination-Participation Restrictions Community Activity    Stability/Clinical Decision Making Stable/Uncomplicated    Rehab Potential Excellent    PT Treatment/Interventions Neuromuscular re-education;Therapeutic exercise;Therapeutic activities;Patient/family education;Manual techniques;Scar mobilization;Biofeedback    PT Next Visit Plan Discharge to HEP    PT Home Exercise Plan Access Code: [email or web address]    Recommended Other Services MD signed initial eval    Consulted and Agree with Plan of Care Patient           Patient  will benefit from skilled therapeutic intervention in order to improve the following deficits and impairments:  Decreased coordination,Increased fascial restricitons,Decreased activity  tolerance,Decreased strength,Decreased scar mobility,Increased muscle spasms  Visit Diagnosis: Muscle weakness (generalized)  Other lack of coordination     Problem List Patient Active Problem List   Diagnosis Date Noted  . Coarctation of aorta, congenital   . Aortic valve disorder 04/27/2013    Earlie Counts, PT 09/17/20 1:53 PM   [facility] [address] [place], [place], [address] Phone: [phone]   Fax:  [phone]  Name: Cassandra Hoover MRN: [id number] Date of Birth: 07/27/1986  PHYSICAL THERAPY DISCHARGE SUMMARY  Visits from Start of Care: 2  Current functional level related to goals / functional outcomes: See above.    Remaining deficits: See above.   Education / Equipment: HEP Plan: Patient agrees to discharge.  Patient goals were met. Patient is being discharged due to meeting the stated rehab goals.  Thank you for the referral. Earlie Counts, PT 09/17/20 1:54 PM  ?????

## 2020-10-01 ENCOUNTER — Encounter: Payer: BC Managed Care – PPO | Admitting: Physical Therapy

## 2020-10-14 ENCOUNTER — Telehealth: Payer: Self-pay | Admitting: Pharmacist

## 2020-10-14 NOTE — Telephone Encounter (Signed)
Prior Authorization request received from [facility]. Submitted PA to patient's insurance and awaiting coverage.

## 2020-10-15 ENCOUNTER — Encounter: Payer: BC Managed Care – PPO | Admitting: Physical Therapy

## 2020-10-29 ENCOUNTER — Encounter: Payer: BC Managed Care – PPO | Admitting: Physical Therapy

## 2021-02-20 ENCOUNTER — Other Ambulatory Visit: Payer: Self-pay

## 2021-02-20 ENCOUNTER — Ambulatory Visit (INDEPENDENT_AMBULATORY_CARE_PROVIDER_SITE_OTHER): Payer: BC Managed Care – PPO

## 2021-02-20 VITALS — BP 140/90 | HR 73 | Temp 98.2°F | Wt 180.0 lb

## 2021-02-20 DIAGNOSIS — Z975 Presence of (intrauterine) contraceptive device: Secondary | ICD-10-CM | POA: Insufficient documentation

## 2021-02-20 DIAGNOSIS — Z30430 Encounter for insertion of intrauterine contraceptive device: Secondary | ICD-10-CM

## 2021-02-20 DIAGNOSIS — Z3202 Encounter for pregnancy test, result negative: Secondary | ICD-10-CM

## 2021-02-20 LAB — POCT URINE PREGNANCY: Preg Test, Ur: NEGATIVE

## 2021-02-20 MED ORDER — LEVONORGESTREL 20 MCG/DAY IU IUD
1.0000 | INTRAUTERINE_SYSTEM | Freq: Once | INTRAUTERINE | Status: AC
  Administered 2021-02-20: 1 via INTRAUTERINE

## 2021-02-22 NOTE — Progress Notes (Signed)
    GYNECOLOGY OFFICE PROCEDURE NOTE  Cassandra Hoover is a 35 y.o. G1P1001 here for Mirena IUD insertion. No GYN concerns.  Last pap smear was in Dec 2020 and was normal.  IUD Insertion Procedure Note IUD: Mirena  Exp: Nov 2022  Lot: [id number]  Patient identified, informed consent performed, consent signed.   Discussed risks of irregular bleeding, cramping, infection, malpositioning or misplacement of the IUD outside the uterus which may require further procedure such as laparoscopy. Time out was performed.  Urine pregnancy test negative.  Bimanual exam performed and uterus of normal size, non-tender, and Middle position. Speculum placed in the vagina and cervix visualized.  Cervix and vaginal walls cleaned x 3 with betadine solution. Anterior aspect grasped with a single tooth tenaculum.  Uterus sounded to 7.5 cm.  Mirena IUD placed per manufacturer's recommendations.  Strings trimmed to ~3 cm. Tenaculum was removed, good hemostasis noted.  Patient tolerated procedure well.   Patient was given post-procedure instructions.  She was advised to have backup contraception for one week.  Patient was instructed to check IUD strings after menses or every 2 months in the absence of menses. Patient also instructed to follow up in 4 weeks for IUD check and call and/or report any issues prior to next visit.  [name], CNM 02/22/2021

## 2021-03-21 ENCOUNTER — Ambulatory Visit (INDEPENDENT_AMBULATORY_CARE_PROVIDER_SITE_OTHER): Payer: BC Managed Care – PPO

## 2021-03-21 ENCOUNTER — Other Ambulatory Visit: Payer: Self-pay

## 2021-03-21 VITALS — BP 122/94 | HR 81 | Temp 98.4°F | Ht 68.0 in | Wt 180.6 lb

## 2021-03-21 DIAGNOSIS — Z30431 Encounter for routine checking of intrauterine contraceptive device: Secondary | ICD-10-CM | POA: Diagnosis not present

## 2021-03-21 DIAGNOSIS — R030 Elevated blood-pressure reading, without diagnosis of hypertension: Secondary | ICD-10-CM

## 2021-03-21 DIAGNOSIS — Z975 Presence of (intrauterine) contraceptive device: Secondary | ICD-10-CM | POA: Diagnosis not present

## 2021-03-21 DIAGNOSIS — N939 Abnormal uterine and vaginal bleeding, unspecified: Secondary | ICD-10-CM

## 2021-03-21 NOTE — Progress Notes (Signed)
    GYNECOLOGY OFFICE ENCOUNTER NOTE  History:  35 y.o. G1P1001 here today for today for IUD string check; Mirena  IUD was placed July 14. Patient reports she has been bleeding since insertion, but endorses that her period had started prior to insertion.  She states bleeding is "just enough to be annoying, but not excessive."  She denies cramping. She reports the bleeding stopped, but returned after sexual intercourse.   The following portions of the patient's history were reviewed and updated as appropriate: allergies, current medications, past family history, past medical history, past social history, past surgical history and problem list. Last pap smear on Dec 2020 with [facility]) was normal, negative HRHPV.   Review of Systems:  Pertinent items are noted in HPI.   Objective:  Physical Exam Blood pressure (!) 122/94, pulse 81, temperature 98.4 F (36.9 C), temperature source Oral, height 5\' 8"  (1.727 m), weight 180 lb 9.6 oz (81.9 kg), last menstrual period 02/19/2021, currently breastfeeding. CONSTITUTIONAL: Well-developed, well-nourished female in no acute distress.  NEUROLOGIC: Alert and oriented to person, place, and time. Normal reflexes, muscle tone coordination.  PSYCHIATRIC: Normal mood and affect. Normal behavior. Normal judgment and thought content. CARDIOVASCULAR: Normal heart rate noted RESPIRATORY: Effort and breath sounds normal, no problems with respiration noted ABDOMEN: Soft, no distention noted.   PELVIC: Normal appearing external genitalia; normal appearing vaginal mucosa and cervix.  IUD strings visualized, about 3 cm in length outside cervix. Done in the presence of a chaperone.   Assessment & Plan:  1. IUD check up -Strings visualized. -Discussed potential bleeding pattern of abnormal to amenorrhea. -Instructed to have removed when desiring conception or at 9 years s/p insertion.   2. Abnormal uterine bleeding (AUB) -Will start on COC x 1  month. -Given 02/21/2021  Exp: Aug 2022 NDC: Sep 2022  3. Elevated blood pressure reading without diagnosis of hypertension -Informed of blood pressure. -Instructed to follow up with PCP.  [id number] MSN, CNM Advanced Practice Provider, [name]

## 2021-07-21 ENCOUNTER — Other Ambulatory Visit: Payer: Self-pay

## 2021-07-21 ENCOUNTER — Ambulatory Visit (INDEPENDENT_AMBULATORY_CARE_PROVIDER_SITE_OTHER): Payer: BC Managed Care – PPO | Admitting: Cardiology

## 2021-07-21 ENCOUNTER — Encounter (HOSPITAL_BASED_OUTPATIENT_CLINIC_OR_DEPARTMENT_OTHER): Payer: Self-pay | Admitting: Cardiology

## 2021-07-21 VITALS — BP 124/80 | HR 93 | Ht 67.0 in | Wt 188.0 lb

## 2021-07-21 DIAGNOSIS — Z7189 Other specified counseling: Secondary | ICD-10-CM | POA: Diagnosis not present

## 2021-07-21 DIAGNOSIS — O10919 Unspecified pre-existing hypertension complicating pregnancy, unspecified trimester: Secondary | ICD-10-CM | POA: Insufficient documentation

## 2021-07-21 DIAGNOSIS — Q251 Coarctation of aorta: Secondary | ICD-10-CM

## 2021-07-21 DIAGNOSIS — I10 Essential (primary) hypertension: Secondary | ICD-10-CM | POA: Diagnosis not present

## 2021-07-21 NOTE — Patient Instructions (Signed)

## 2021-07-21 NOTE — Progress Notes (Signed)
Cardiology Office Note:    Date:  07/21/2021   ID:  Cassandra Hoover, DOB 01-30-1986, MRN [id number]  PCP:  Katherina Mires, MD  Cardiologist:  Buford Dresser, MD  Referring MD: Katherina Mires, MD   CC: follow up  History of Present Illness:    Cassandra Hoover is a 35 y.o. female with a hx of coarctation of the aorta who is seen for follow up today. I met her 07/28/2019 as a new patient to establish care.  Cardiac history: Has a history of coarctation of the aorta, with surgery at 10 days and then balloon angioplasty at 35 years old. No issues since. Echo 09/2016 shows mildly increased velocity in the aorta.   Imaging: MRI q5 years preferred.  Believes she has had MRI and CT in the past. Echo windows have been difficult to get since breast implants. Last imaging was in [place] in 2014. Believes she had head imaging for aneurysm in ~2005/2006, was told she did not need to repeat. There was concern ~2008 that she may have bicuspid aortic valve, but she does not think this was ever definitively determined. On review of records, there is mention of bicuspid aortic valve. Echo from 09/21/16 notes normal LV size, normal EF (55%). Aortic valve not well visualized. Increased velocity in descending aorta (2.1 m/s).  Today: Doing well. Now has a one year old daughter. Had high blood pressure, induced early. Started on amlodipine during the pregnancy, now down to 5 mg daily from 10 mg during the pregnancy. Checks BP at home, has been in normal range.   No new issues or concerns. Reviewed prior cardiac MRI.   Denies chest pain, shortness of breath at rest or with normal exertion. No PND, orthopnea, LE edema or unexpected weight gain. No syncope or palpitations.    Past Medical History:  Diagnosis Date   Coarctation of aorta 05/17/2013   Since childhood, Coarct 1.7cmX1.8cm   Coarctation of aorta, congenital    surgical repair at 30 days old and 25 years   Gestational hypertension, third  trimester 06/05/2020   Obstetric vaginal laceration with second degree perineal laceration 06/06/2020   Rh negative state in antepartum period 11/27/2019    Past Surgical History:  Procedure Laterality Date   BREAST ENHANCEMENT SURGERY     COARCTATION OF AORTA REPAIR     REFRACTIVE SURGERY     TONSILLECTOMY      Current Medications: Current Outpatient Medications on File Prior to Visit  Medication Sig   amLODipine (NORVASC) 5 MG tablet Take 5 mg by mouth daily.   Blood Pressure Monitoring (BLOOD PRESSURE MONITOR AUTOMAT) DEVI 1 Device by Does not apply route daily. Automatic blood pressure cuff regular size. To monitor blood pressure regularly at home. ICD-10 code: O09.90   Cholecalciferol (D3-50 PO) Take 2,000 Units by mouth daily.    fluticasone (FLONASE) 50 MCG/ACT nasal spray Place into both nostrils daily as needed for allergies or rhinitis.   Loratadine 10 MG CAPS Take by mouth.    Magnesium Oxide (MAG-OXIDE) 200 MG TABS Take 2 tablets (400 mg total) by mouth at bedtime. If that amount causes loose stools in the am, switch to 265m daily at bedtime. (Patient taking differently: Take 400 mg by mouth at bedtime.)   Prenatal MV & Min w/FA-DHA (ONE A DAY PRENATAL PO) Take 1 tablet by mouth daily.   No current facility-administered medications on file prior to visit.     Allergies:   Sudafed [pseudoephedrine]   Social  History   Tobacco Use   Smoking status: Never   Smokeless tobacco: Never  Vaping Use   Vaping Use: Never used  Substance Use Topics   Alcohol use: Not Currently    Alcohol/week: 3.0 - 4.0 standard drinks    Types: 3 - 4 Glasses of wine per week    Comment: socially   Drug use: Never    Family History: family history includes Heart failure in her maternal grandmother.  ROS:   Please see the history of present illness.  Additional pertinent ROS otherwise unremarkable.  EKGs/Labs/Other Studies Reviewed:    The following studies were reviewed today: cMRI  09/13/19 FINDINGS: Normal LA/RA size. Normal RV size and function No ASD/PFO/VSD. No pericardial effusion normal MV,TV. Aortic valve is tri-leaflet with no significant stenosis or regurgitation. Normal LV size and function. Quantitative EF 59% (EDV 114 cc ESV 47 cc SV 68 cc) Septal thickness normal 8 mm no LVH. Delayed gadolinium images showed no hyper-enhancement of the LV myocardium   Aorta: There is fusiform relative dilatation of the ascending aortic root. The great vessels exit the arch normally The arch itself is relatively hypoplastic. Just distal to the left subclavian take off there is residual "pseudo coarctation" at the likely sight of previous repair. This area narrows down to a diameter of 13 mm. Flow analysis through this area precisely was not performed   Aortic Sinus: 28 mm   STJ 25 mm   Aortic Root: 34 mm   Aortic Arch 18 mm   Pseudo Coarctation: 13 mm just distal to left subclavian take off   Proximal descending thoracic aorta 24 mm   Distal descending thoracic aorta 17 mm   IMPRESSION: 1. Normal tri- leaflet aortic valve   2. Relative dilatation of the ascending aortic root at 34 mm with hypoplastic arch 17 mm. Residual "pseudo-coarctation" likely at site of previous repair with residual minimal luminal diameter of 13 mm. Flow analysis through the smallest area not performed   3.  Normal LV size and function EF 59%   4.  No delayed LV myocardial enhancement post gadolinium    Care Everywhere: IMPRESSION:  There is a coarct of the proximal descending thoracic aorta at the level of the left subclavian artery with minimal diameter of 1.8 x 1.7 cm.  Maximal ascending thoracic aortic measurement is 3.3 x 3.0 cm.  No evidence of dissection.  Report E-Signed By: Bennetta Laos, M.D.  at 05/02/2013 1:09 PM FOY:[id number]  Result Narrative  CLINICAL DATA:  Thoracic aneurysm.  TECHNICAL DATA:  Following dynamic intravenous nonionic contrast  infusion, multiple axial helical overlapped CTA images of the chest and abdomen with multiplanar reconstructions were obtained. All CT data was transferred to a 3D workstation for multiplanar reformation and 3D reconstruction under concurrent physician supervision.  FINDINGS:  Aorta at the valve plane measures 2.9 x 2.2 cm.  There is some motion artifact here. Just above the valve plane, the aorta measures 2.3 x 2.3 cm. Mid ascending thoracic aorta measures 3.3 x 3.0 cm.  This gradually tapers toward the transverse arch.  Classic arch anatomy is present. Focal tortuosity with an area of narrowing of the distal arch/proximal descending thoracic aorta is identified.  The aorta at the distal arch just below the origin of the left subclavian artery measures 1.8 x 1.7 cm. Proximal descending thoracic aorta measures 2.5 x 2.7 cm. Mid descending thoracic aorta measures 2.5 x 2.4 cm Distal descending thoracic aorta measures 1.9 x 1.9 cm.  Celiac  artery and superior mesenteric arteries are widely patent.  Single renal arteries are patent bilaterally  No dissection is identified.  No evidence of mediastinal or hilar adenopathy.  Minimal increased density is identified in the anterior mediastinal fat consistent with residual thymic tissue.  Bilateral breast implants.  No evidence of lung consolidation.  No evidence of effusion.    EKG:  EKG is personally reviewed.   07/21/2021 sinus rhythm with sinus arrhythmia, IVCD 12/18/202 sinus rhythm with sinus arrhythmia, IVCD  Recent Labs: No results found for requested labs within last 8760 hours.  Recent Lipid Panel No results found for: CHOL, TRIG, HDL, CHOLHDL, VLDL, LDLCALC, LDLDIRECT  Physical Exam:    VS:  BP 124/80   Pulse 93   Ht '5\' 7"'  (1.702 m)   Wt 188 lb (85.3 kg)   SpO2 96%   BMI 29.44 kg/m     Wt Readings from Last 3 Encounters:  07/21/21 188 lb (85.3 kg)  03/21/21 180 lb 9.6 oz (81.9 kg)  02/20/21 180 lb (81.6 kg)    GEN: Well  nourished, well developed in no acute distress HEENT: Normal, moist mucous membranes NECK: No JVD CARDIAC: regular rhythm, normal S1 and S2, no rubs or gallops. 3/6 systolic murmur RUSB. VASCULAR: Radial and DP pulses 2+ bilaterally. No carotid bruits RESPIRATORY:  Clear to auscultation without rales, wheezing or rhonchi  ABDOMEN: Soft, non-tender, non-distended MUSCULOSKELETAL:  Ambulates independently SKIN: Warm and dry, no edema NEUROLOGIC:  Alert and oriented x 3. No focal neuro deficits noted. PSYCHIATRIC:  Normal affect    ASSESSMENT:    1. Coarctation of aorta   2. Cardiac risk counseling   3. Counseling on health promotion and disease prevention   4. Essential hypertension     PLAN:    Coarctation of the aorta: l -doing well, reviewed cardiac MRI. She did have a lot of anxiety with the MRI, consider premedication or CTA next time -no issues with recent pregnancy  Hypertension: at goal, continue amlodipine  Aortic valve is tricuspid based on cMRI, prior external reports note concern for bicuspid valve  Cardiac risk counseling and prevention recommendations: -recommend heart healthy/Mediterranean diet, with whole grains, fruits, vegetable, fish, lean meats, nuts, and olive oil. Limit salt. -recommend moderate walking, 3-5 times/week for 30-50 minutes each session. Aim for at least 150 minutes.week. Goal should be pace of 3 miles/hours, or walking 1.5 miles in 30 minutes -recommend avoidance of tobacco products. Avoid excess alcohol. -with plans for pregnancy, need baseline studies, ordered today  Plan for follow up: in 2 years or sooner as needed  Medication Adjustments/Labs and Tests Ordered: Current medicines are reviewed at length with the patient today.  Concerns regarding medicines are outlined above.  Orders Placed This Encounter  Procedures   EKG 12-Lead    No orders of the defined types were placed in this encounter.   Patient Instructions  Medication  Instructions:  Your Physician recommend you continue on your current medication as directed.    *If you need a refill on your cardiac medications before your next appointment, please call your pharmacy*   Lab Work: None ordered today   Testing/Procedures: None ordered today   Follow-Up: At [facility], you and your health needs are our priority.  As part of our continuing mission to provide you with exceptional heart care, we have created designated Provider Care Teams.  These Care Teams include your primary Cardiologist (physician) and Advanced Practice Providers (APPs -  Physician Assistants and Nurse Practitioners)  who all work together to provide you with the care you need, when you need it.  We recommend signing up for the patient portal called "MyChart".  Sign up information is provided on this After Visit Summary.  MyChart is used to connect with patients for Virtual Visits (Telemedicine).  Patients are able to view lab/test results, encounter notes, upcoming appointments, etc.  Non-urgent messages can be sent to your provider as well.   To learn more about what you can do with MyChart, go to [email or web address].    Your next appointment:   2 year(s)  The format for your next appointment:   In Person  Provider:   Buford Dresser, MD     Signed, Buford Dresser, MD PhD 07/21/2021  [facility]

## 2022-02-14 IMAGING — US US MFM OB COMPLETE +14 WKS
1 series · 13 of 28 positions shown · non-contrast
Comparison: none

[Series 1: us mfm ob complete +14 wks · 34 acquisitions, 13 frames shown]
[im 2/34]
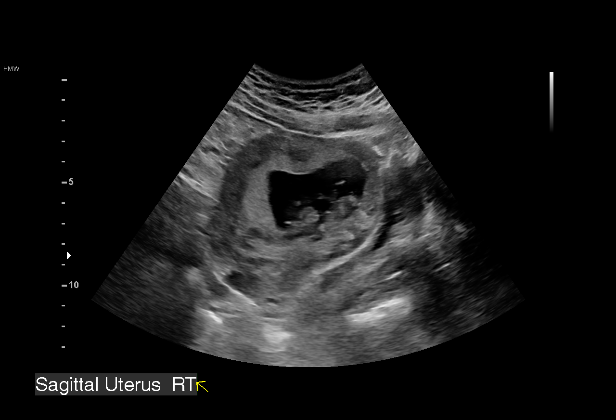
[im 4/34]
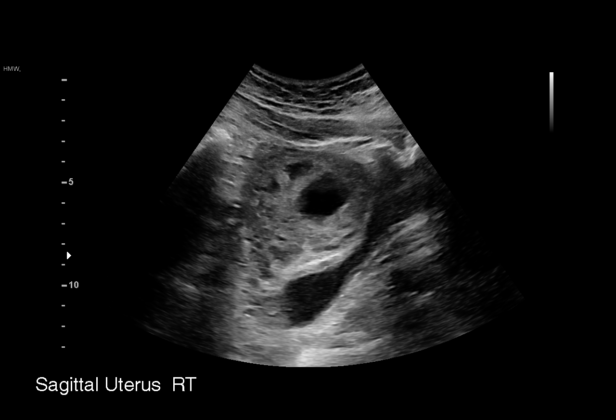
[im 7/34]
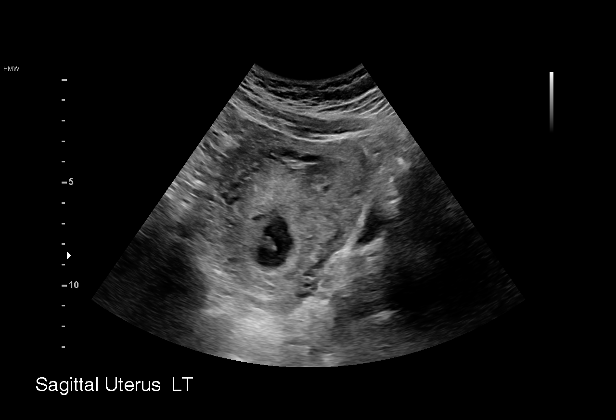
[im 9/34]
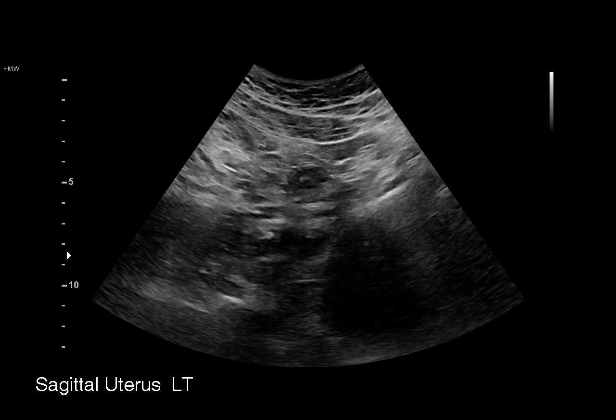
[im 12/34]
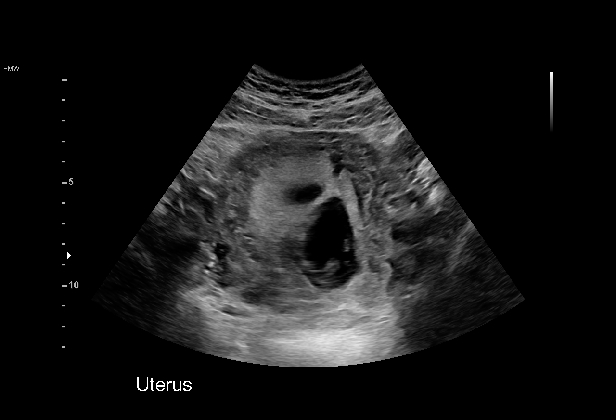
[im 14/34]
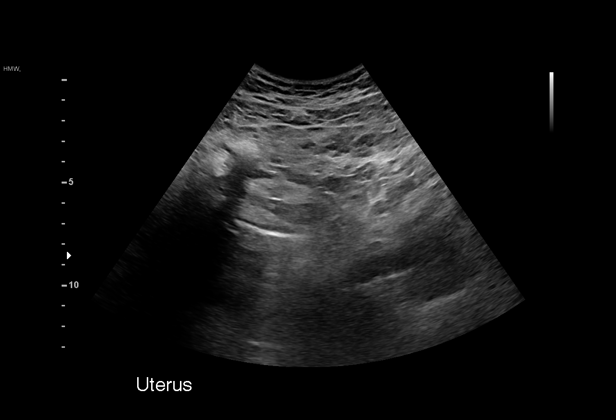
[im 18/34]
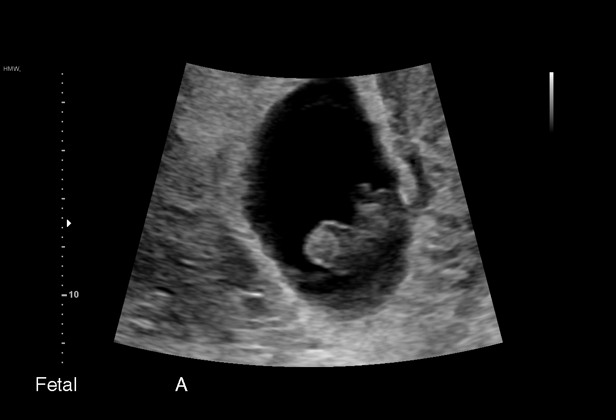
[im 20/34]
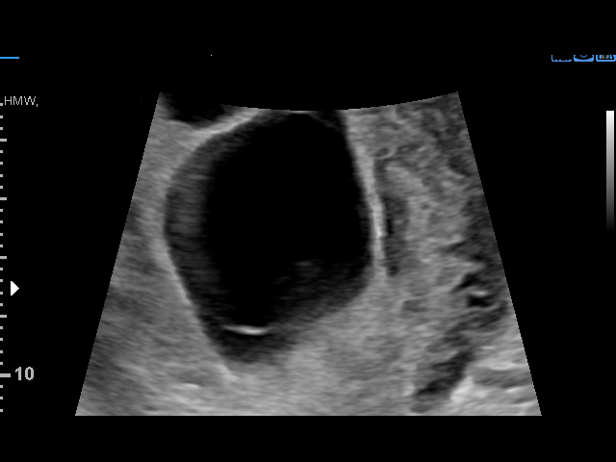
[im 23/34]
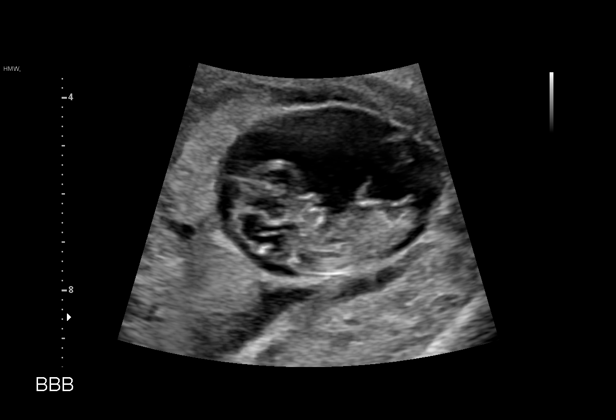
[im 25/34]
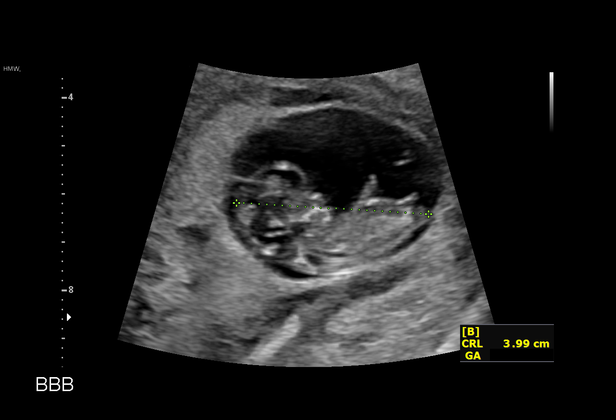
[im 27/34]
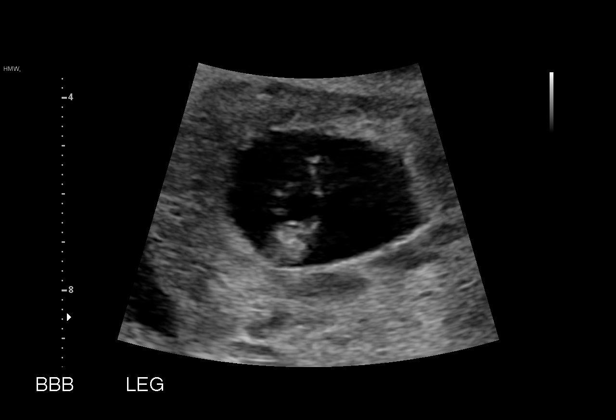
[im 30/34]
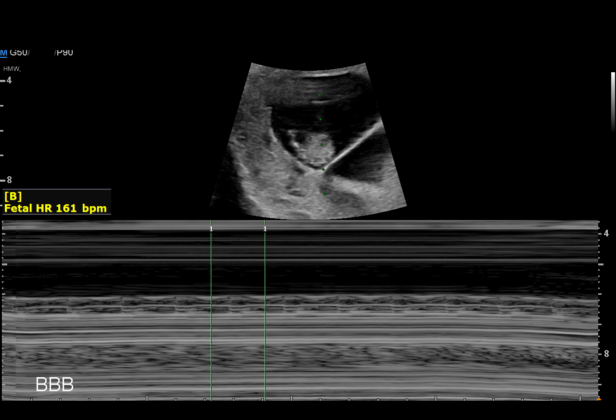
[im 32/34]
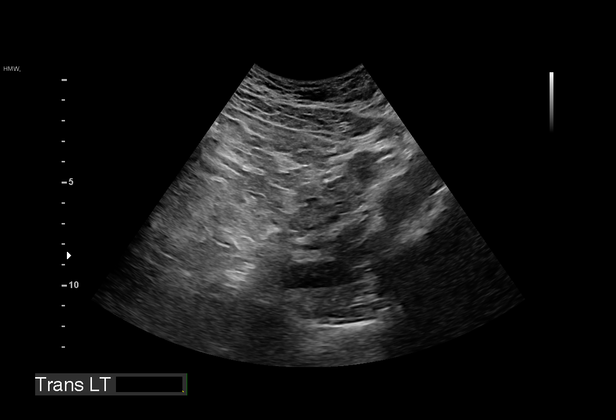

[13 of 28 positions shown; findings below may reference images not displayed]

1  US MFM OB COMP LESS THAN             76801.4      JEAN ROBERT DE LA PAZ
     14 WEEKS
  2  US MFM OB COMP EACH ADD              76802.1      JEAN ROBERT DE LA PAZ
     HAYE LESS 14 WEEKS
 ----------------------------------------------------------------------

 ----------------------------------------------------------------------
Indications

  10 weeks gestation of pregnancy
  Encounter for antenatal screening for
  malformations
  Medical complication of pregnancy
  (coarctation of aorta)
  Twin pregnancy, di/di, first trimester
  Twin pregnancy with loss (demise) of one       O30.009,
  fetus, antepartum
 ----------------------------------------------------------------------
Fetal Evaluation (Fetus A)

 Num Of Fetuses:         2
 Preg. Location:         Intrauterine
 Gest. Sac:              Intrauterine
 Yolk Sac:               Not visualized
 Fetal Pole:             Visualized
 Cardiac Activity:       Absent
 Fetal Lie:              Lower Fetus
 Membrane Desc:      Dividing Membrane seen - Dichorionic.

 Amniotic Fluid
 AFI FV:      Within normal limits
Biometry (Fetus A)

 CRL:      22.3  mm     G. Age:  8w 5d                   EDD:   07/07/20
OB History

 Gravidity:    1         Term:   0        Prem:   0        SAB:   0
 TOP:          0       Ectopic:  0        Living: 0
Gestational Age (Fetus A)

 LMP:           12w 4d        Date:  09/04/19                 EDD:   06/10/20
 Best:          10w 4d     Det. By:  U/S C R L Fetus B        EDD:   06/24/20
                                     (12/01/19)

Fetal Evaluation (Fetus B)

 Num Of Fetuses:         2
 Preg. Location:         Intrauterine
 Gest. Sac:              Intrauterine
 Yolk Sac:               Not visualized
 Fetal Pole:             Visualized
 Fetal Heart Rate(bpm):  161
 Cardiac Activity:       Observed
 Membrane Desc:      Dividing Membrane seen - Dichorionic.

 Amniotic Fluid
 AFI FV:      Within normal limits
Biometry (Fetus B)

 CRL:      39.2  mm     G. Age:  10w 4d                  EDD:   06/24/20
Gestational Age (Fetus B)

 LMP:           12w 4d        Date:  09/04/19                 EDD:   06/10/20
 Best:          10w 4d     Det. By:  U/S C R L Fetus B        EDD:   06/24/20
                                     (12/01/19)
Anatomy (Fetus B)

 Thoracic:              Appears normal         Upper Extremities:      Visualized
 Stomach:               Appears normal, left   Lower Extremities:      Visualized
                        sided
Cervix Uterus Adnexa

 Cervix
 Normal appearance by transabdominal scan.

 Uterus
 No abnormality visualized.

 Left Ovary
 No adnexal mass visualized.

 Right Ovary
 No adnexal mass visualized.

 Cul De Sac
 No free fluid seen.

 Adnexa
 No abnormality visualized.
Impression

 We performed ultrasound today.  Dichorionic diamniotic twin
 gestation was seen.  Unfortunately, fetal heart activity was
 not seen in twin A. The CRL measurement consistent with 8w
 5d gestation.

 Twin B: The CRL that is consistent with 10w 4d patient and
 good fetal heart activity seen.

 measurement of twin B.
 xxxxxxxxxxxxxxxxxxxxxxxxxxxxxxxxxxxxxxxxxxxxxxxxxxxxxxxxx
 x
 Consultation (from [REDACTED])
 Name: Kimberli Kulkarni
 DOB: 09/09/1985
 MRN: [id number]
 Referring Provider: Laureano, Drew

 MUJER had the pleasure of seeing [facility]. [facility]ine [facility].  She is G1 P0 at 12-weeks' gestation and is
 here for first trimester ultrasound and consultation.
 Her past medical history is significant for repaired coarctation
 of aorta.
 Patient gives history of congenital heart disease (coarctation
 of aorta) was initially repaired at 10 days of age.
 Subsequently, at 14 years of age she had balloon angioplasty
 to dilate coarctation.  Patient reports she had no further
 narrowing and follow-up imaging studies were normal.  Also
 informed that she has bicuspid aortic valve. Patient reports
 her family was screened (examination) for Marfan syndrome
 and was ruled out.
 Patient was seen by her cardiologist, Dr. Kraemer
 Sinna. CT angiography showed normal aorta with no
 narrowing or dissection. Arch is normal. EKG was reported as
 normal. Echo was not performed in the past because of
 breast implants (poor resolution). Apparently, bicuspid aortic
 valve was suspected (not confirmed) in the past.
 Review of systems: No shortness of breath on moderate to
 strenuous activities, no chest pain or palpitations.  No severe
 headaches or visual disturbances.  No abdominal or joint
 pains.  No vaginal bleeding.

 Past medical history: No history of hypertension or diabetes
 or sickle cell disease or trait.
 Past surgical history: Coarctation of aorta, balloon
 angioplasty, tonsillectomy (5508), Lasik surgery 10/13/2017),
 breast augmentation (3353).
 Medications: Prenatal vitamins.
 Allergies: No known drug allergies.
 Social history: Denies tobacco drug or alcohol use.  She has
 been married 10 years and her husband is in good health he
 is Caucasian blood type is B positive.
 Family history of abnormal Pap smears in 0776 no history of
 venous thromboembolism in the family.
 GYN history: History of abnormal Pap smear in 0776.  No
 history of cervical surgeries.  Unsure of her LMP.  No history
 of sexually transmitted diseases.
 Obstetric history: G1 P0

 We performed ultrasound today.  Dichorionic diamniotic twin
 gestation was seen.  Unfortunately, fetal heart activity was
 not seen in twin A. The CRL measurement consistent with 8w
 5d gestation.

 Twin B: The CRL that is consistent with 10w 4d patient and
 good fetal heart activity seen.

 We have assigned her EDD at 06/24/2020 based on the CRL
 measurement of twin B.

 I counseled the patient on the following:
 Twin pregnancy with embryonic demise of twin
 -I explained the chorionic city and the lack of fetal heart
 activity in twin A with real ultrasound images.
 -Explained that early pregnancy loss of one twin ("vanishing
 twin") can occur in about 20% to 25% of cases (average).
 -Loss of one twin early in pregnancy is not associated with
 any adverse outcomes in the surviving twin. I reassured the
 patient that we should expect good pregnancy course and
 outcomes.
 -She may experience some vaginal spotting/bleeding
 because twin A is closer to the cervix. If she experiences
 heavy bleeding with or without clots, she should contact your
 office. Ultrasound may be indicated.
 -I explained that cell-free fetal DNA screening results can be
 abnormal if twin A (demise) has chromosomal anomalies. We
 do not usually recommend repeating cell-free fetal DNA
 screening. Some reports have shown that repeating
 screening after 8 to 10 weeks may be recommended.
 -First-trimester screening cannot be performed as it
 incorporates maternal blood (CINEUS and beta hCG).
 -Patient's blood type is O negative. I informed her that if fetal
 death occurs in the second- or third trimester, we recommend
 anti-D immunoglobulin (rhogam) to prevent rhesus
 isoimmunization. Evidence is scant whether one should give
 if rhogam should be given if loss of one twin occurs in the first
 trimester. It is usually given within 72 hours or not later than
 15 days. Recent antibody screen (11/22/19) is negative.
 Patient informed she would like to take rhogam. She received
 rhogam at our office.

 Coarctation of aorta
 I counseled the patient on the following (some comments are
 for her providers):
 -Maternal coarctation of aorta is classified under modified
 WHO (mWHO) class II-III risk score (expected major cardiac
 event of 10%-19%).
 -Recent data from Registry of Pregnancy and Cardiac
 Disease (ROPAC) has shown favorable outcomes in 303
 patients with coarctation of aorta in prospectively enrolled
 cardiac patients (Pregnancy outcomes in women with aortic
 coarctation Arnouald et al. Heart 8881;[DATE]).
 -In women with no preexisting hypertension, repaired
 coarctation and in those who are not taking any medications
 (cardiac), hospitalizations were less common and outcomes
 favorable.
 -No maternal mortality was seen.
 -Incidence of preeclampsia/gestational hypertension were
 lower than in women without coarctation. This could reflect
 early identification of hypertension and treatment in this group.
 -More patients with unrepaired coarctation had major cardiac
 events and were hospitalized.
 -Women with preexisting symptoms are more likely to be
 hospitalized.
 -In this study, major adverse cardiac event (MACE) was 4.3%
 (less than the predicted 10% to 19% event in patients with
 mWHO class II-III risk score.

 Our patient does not have hypertension or severe symptoms
 including shortness of breath or chest pain. No aneurysm
 was identified (more common in patients with bicuspid aortic
 valve) on previous imaging studies. She also had repair of
 coarctation in the newborn period and, subsequently, balloon
 angioplasty procedure.
 She was not prescribed beta blockers by cardiologist and I
 defer this decision to her cardiologist.
 I reassured her that vaginal delivery can be safely attempted
 and cesarean section would be performed only for obstetric
 indications.

 Overall, she is likely to have good pregnancy outcome with
 less-likelihood of a major adverse cardiac event. Pregnancy
 is associated with symptoms of increased shortness of breath
 (physiological) and her cardiac symptoms, if present, should
 be closely monitored.

 The likelihood of congenital heart malformations in the fetus
 is slightly increased (5% vs. 1% in the general population).
 Fetal echocardiography at 20 weeks should be performed.

 First pregnancy and African American ethnicity place her at a
 slightly high risk for preeclampsia. I did not discuss aspirin
 prophylaxis for prevention of preeclampsia today.
Recommendations

 -An appointment was made for her to return in 2 weeks for
 first trimester ultrasound.
 -Disregard cell-free fetal DNA screening results.
 -Detailed fetal anatomy at 18 weeks to rule out anomalies.
 -Low-dose aspirin prophylaxis to be discussed with patient at
 her next prenatal/ultrasound visits.
 -I recommend low-dose aspirin 81 mg daily from 12 to 13
 weeks till delivery (provided no contraindications exist).
 -Fetal echocardiography at 20 weeks.
 -Vaginal delivery.

                 Agrawal, Matheus

## 2022-05-09 IMAGING — US US MFM OB FOLLOW-UP
1 series · 13 of 28 positions shown · non-contrast
Comparison: none

[Series 1: us mfm ob follow-up · 40 acquisitions, 13 frames shown]
[im 2/40]
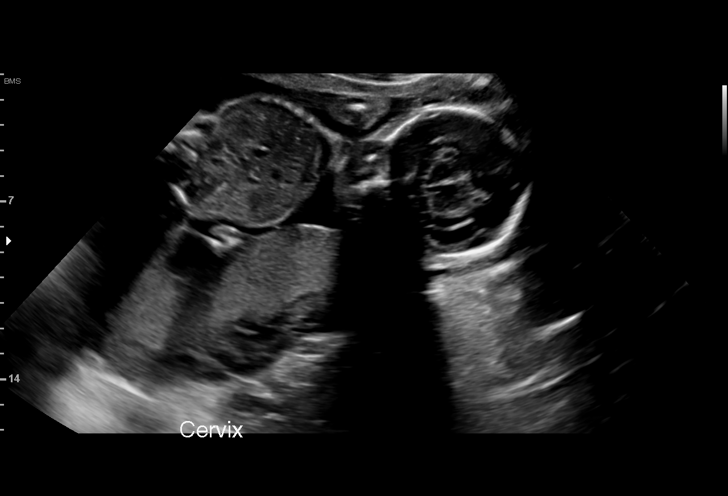
[im 5/40]
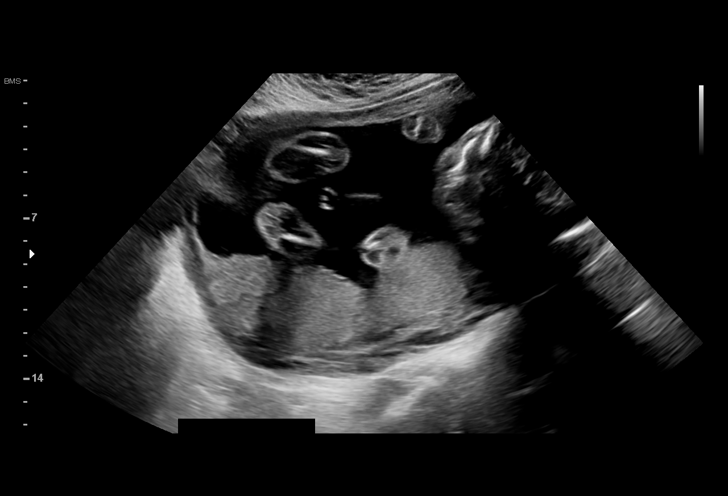
[im 8/40]
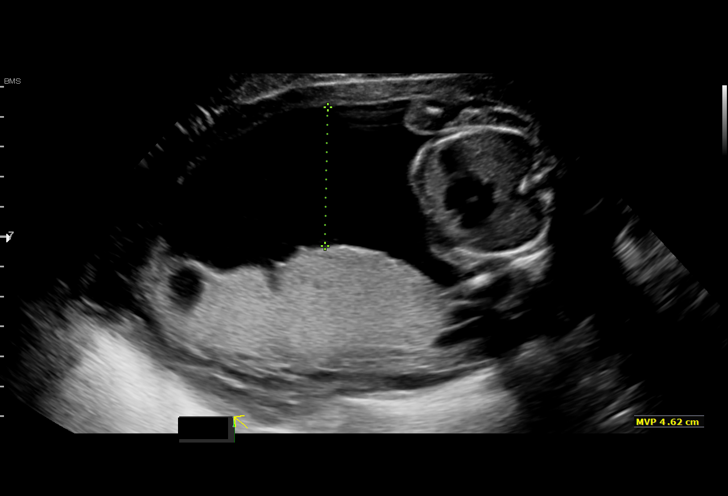
[im 11/40]
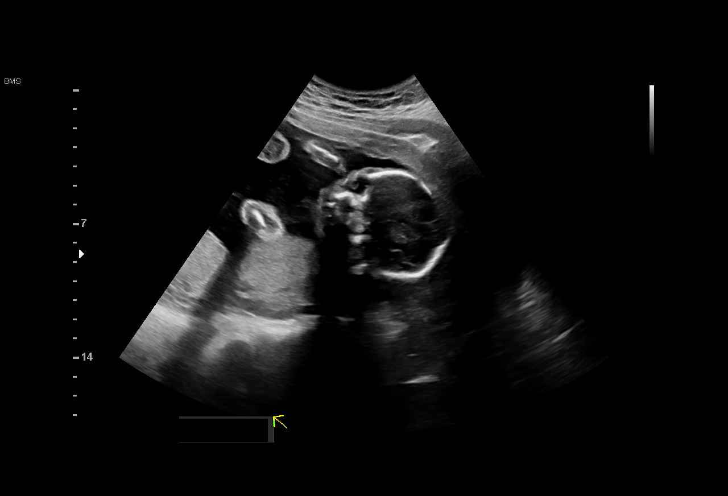
[im 14/40]
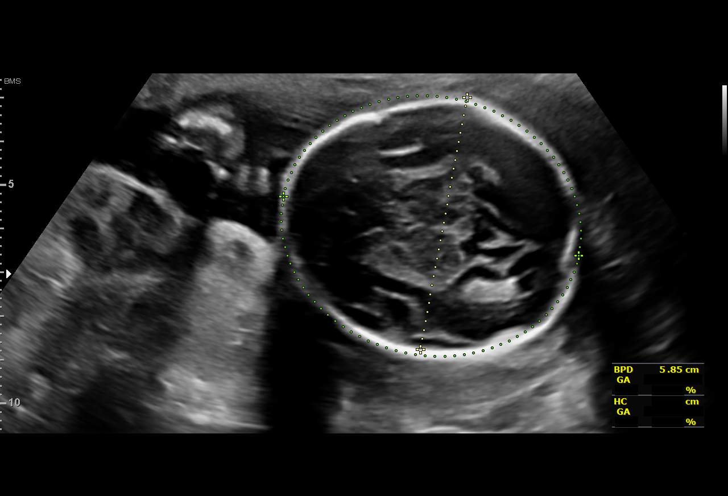
[im 16/40]
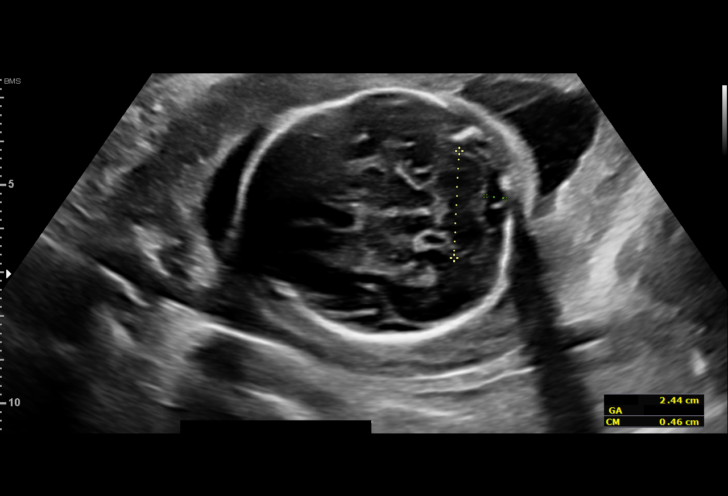
[im 21/40]
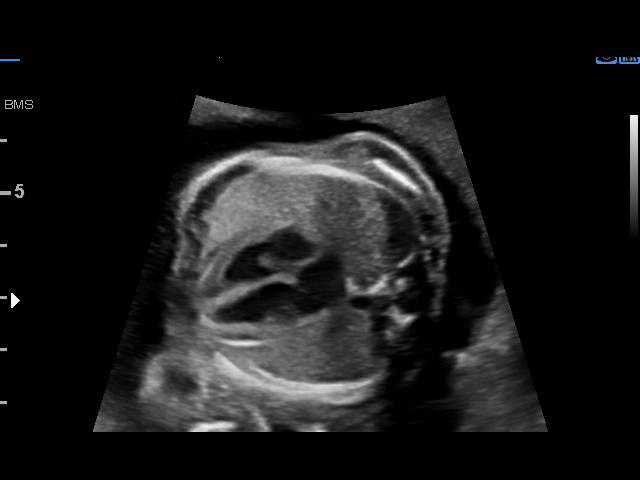
[im 24/40]
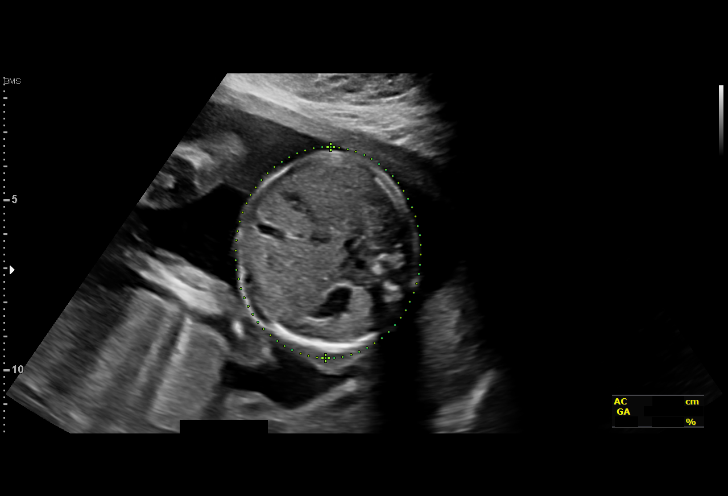
[im 27/40]
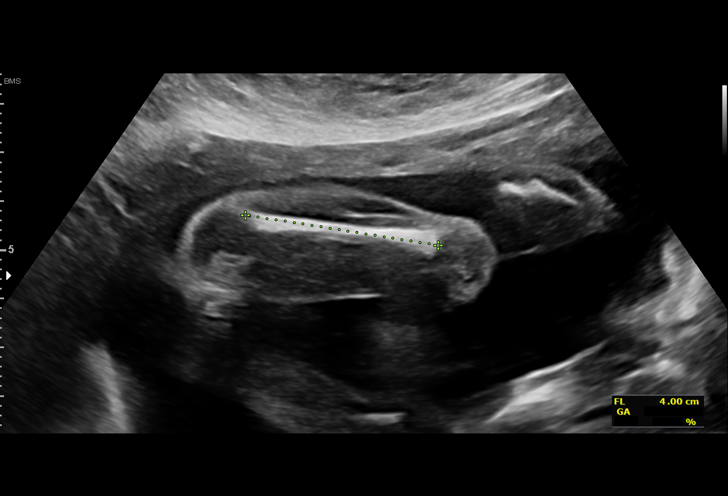
[im 29/40]
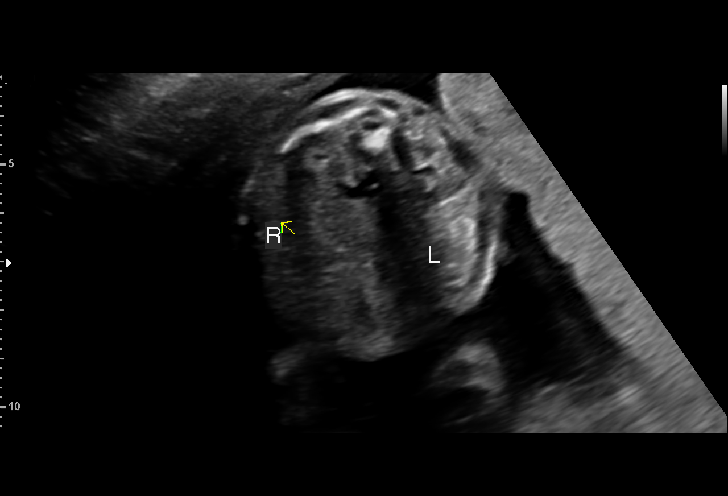
[im 32/40]
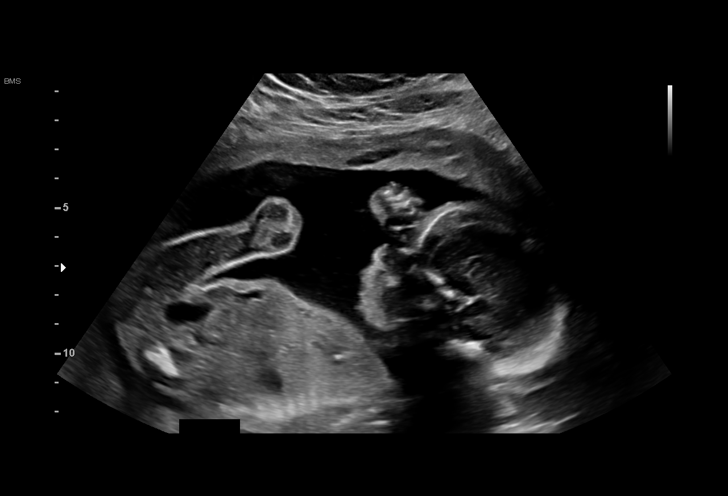
[im 35/40]
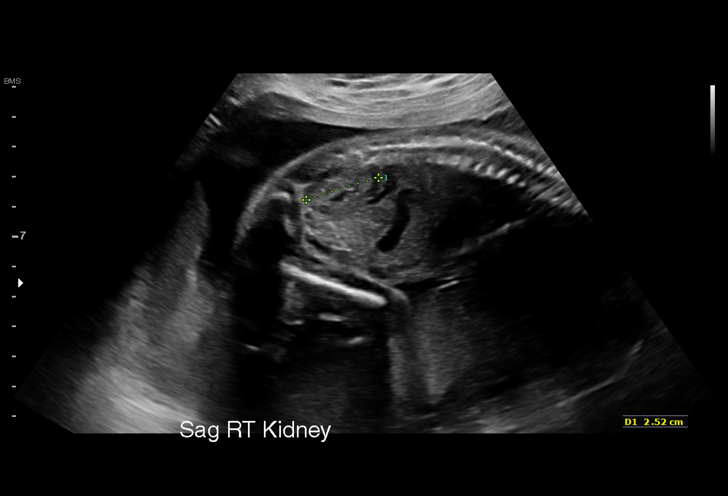
[im 38/40]
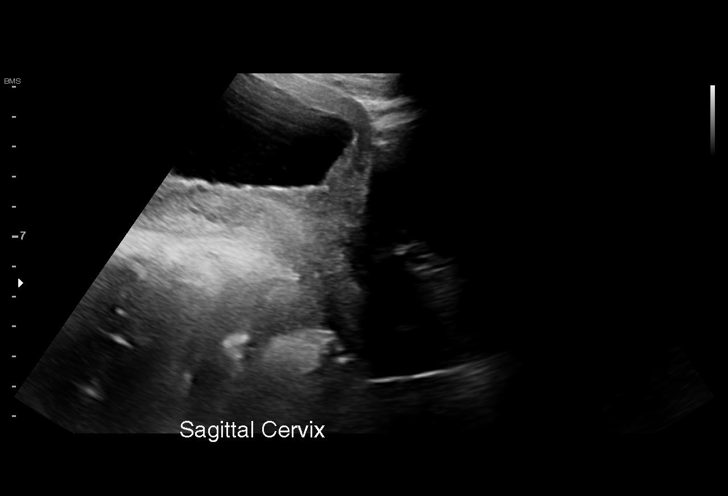

[13 of 28 positions shown; findings below may reference images not displayed]

Indications

 22 weeks gestation of pregnancy
 Medical complication of pregnancy
 (coarctation of aorta)
 Encounter for antenatal screening for
 malformations
 Twin pregnancy with loss (demise) of one       O30.009,
 fetus, antepartum
Fetal Evaluation

 Num Of Fetuses:         1
 Fetal Heart Rate(bpm):  135
 Cardiac Activity:       Observed
 Presentation:           Cephalic
 Placenta:               Posterior
 P. Cord Insertion:      Marginal insertion

 Amniotic Fluid
 AFI FV:      Within normal limits

                             Largest Pocket(cm)

Biometry

 BPD:      58.3  mm     G. Age:  23w 6d         88  %    CI:        82.93   %    70 - 86
                                                         FL/HC:      19.8   %    19.2 -
 HC:      201.9  mm     G. Age:  22w 2d         27  %    HC/AC:      1.09        1.05 -
 AC:      185.9  mm     G. Age:  23w 3d         68  %    FL/BPD:     68.4   %    71 - 87
 FL:       39.9  mm     G. Age:  22w 6d         49  %    FL/AC:      21.5   %    20 - 24
 CER:      24.4  mm     G. Age:  22w 3d         69  %

 LV:        3.1  mm
 CM:        4.6  mm
 Est. FW:     564  gm      1 lb 4 oz     71  %
OB History

 Gravidity:    1         Term:   0        Prem:   0        SAB:   0
 TOP:          0       Ectopic:  0        Living: 0
Gestational Age

 LMP:           24w 4d        Date:  09/04/19                 EDD:   06/10/20
 U/S Today:     23w 1d                                        EDD:   06/20/20
 Best:          22w 4d     Det. By:  U/S C R L Fetus B        EDD:   06/24/20
                                     (12/01/19)
Anatomy

 Cranium:               Appears normal         Aortic Arch:            Previously seen
 Cavum:                 Appears normal         Ductal Arch:            Previously seen
 Ventricles:            Appears normal         Diaphragm:              Appears normal
 Choroid Plexus:        Previously seen        Stomach:                Appears normal, left
                                                                       sided
 Cerebellum:            Appears normal         Abdomen:                Appears normal
 Posterior Fossa:       Appears normal         Abdominal Wall:         Previously seen
 Nuchal Fold:           Not applicable (>20    Cord Vessels:           Previously seen
                        wks GA)
 Face:                  Orbits and profile     Kidneys:                Appear normal
                        previously seen
 Lips:                  Previously seen        Bladder:                Appears normal
 Thoracic:              Previously seen        Spine:                  Previously seen
 Heart:                 Previously seen        Upper Extremities:      Previously seen
 RVOT:                  Previously seen        Lower Extremities:      Previously seen
 LVOT:                  Previously seen

 Other:  Fetus appears to be female. Nasal bone prev visualized. Open hands
         prev visualized. Heels and 5th digit prev visualized.  Technically
         difficult due to maternal habitus, early GA, and fetal position.
Cervix Uterus Adnexa

 Cervix
 Length:            3.5  cm.
 Normal appearance by transabdominal scan.

 Uterus
 No abnormality visualized.

 Right Ovary
 Not visualized.

 Left Ovary
 Not visualized.

 Cul De Sac
 No free fluid seen.

 Adnexa
 No adnexal mass visualized.
Impression

 Patient with dichorionic-diamniotic twin pregnancy with early
 embryonic demise of 1 twin return for fetal growth
 assessment and completion of fetal anatomy.  Marginal cord
 insertion was seen on previous scan.
 She will be having fetal echocardiography next week at [HOSPITAL],
 [facility].

 On today's ultrasound, amniotic fluid is normal and good fetal
 activity seen.  Fetal growth is appropriate for gestational age.
 RVOT could not be assessed because of fetal position.

 We reassured the patient of the findings.
Recommendations

 -An appointment was made for her to return in 8 weeks for
 fetal growth assessment.
                 Eichelberger, Koffi

## 2022-06-26 DIAGNOSIS — F411 Generalized anxiety disorder: Secondary | ICD-10-CM | POA: Insufficient documentation

## 2022-07-09 ENCOUNTER — Encounter: Payer: Self-pay | Admitting: Advanced Practice Midwife

## 2022-07-09 ENCOUNTER — Ambulatory Visit (INDEPENDENT_AMBULATORY_CARE_PROVIDER_SITE_OTHER): Payer: BC Managed Care – PPO | Admitting: Advanced Practice Midwife

## 2022-07-09 VITALS — BP 135/85 | HR 98 | Ht 68.0 in | Wt 185.0 lb

## 2022-07-09 DIAGNOSIS — Z319 Encounter for procreative management, unspecified: Secondary | ICD-10-CM

## 2022-07-09 DIAGNOSIS — Z30432 Encounter for removal of intrauterine contraceptive device: Secondary | ICD-10-CM | POA: Insufficient documentation

## 2022-07-09 DIAGNOSIS — I10 Essential (primary) hypertension: Secondary | ICD-10-CM

## 2022-07-09 NOTE — Progress Notes (Signed)
Patient presents for IUD Removal.  Inserted 02/20/21 Mirena   Pt wants to conceive.  Would like to discuss medication management.   Last Pap: 08/01/2019 WNL .

## 2022-07-09 NOTE — Progress Notes (Signed)
    GYNECOLOGY OFFICE PROCEDURE NOTE  Cassandra Hoover is a 36 y.o. G1P1001 here for IUD removal. No GYN concerns.  Last pap smear was on 08/01/2019 and was normal. Patient desires pregnancy. She has already initiated prenatal vitamins with DHA and folic acid.   Patient has questions regarding her Zoloft and if she should discontinue it while TTC. She has discussed this with her PCP, who advised continuing medication.  IUD Removal and Reinsertion  Patient identified, informed consent performed, consent signed.  Time out was performed. Speculum placed in the vagina. The strings of the IUD were grasped and pulled using ring forceps. The IUD was successfully removed in its entirety. Patient tolerated procedure well.   Discussed formal data surrounding Zoloft and pregancy. Given patient handout printed from [employer]. Advised to continue Zoloft, which is her preference.  Clayton Bibles, MSA, MSN, CNM Certified Nurse Midwife, Biochemist, clinical for [employer], [facility]

## 2022-08-10 NOTE — L&D Delivery Note (Signed)
Delivery Note ROM x 8h 73m. Pt pushed x 8 minutes. Difficult to trace FHR while pushing (intermittent tracing in 90's) but pt progressed quickly. At 8:36 PM a viable and healthy female was delivered via Vaginal, Spontaneous (Presentation: Occiput Anterior). Shoulders delivered easily. Infant placed skin-to-skin w/ mom. Delayed cord clamping x 1 minute. Infant with good tone, HR and improving color but shallow respiratory effort so cord clamped x 2 and cut by FOB and baby taken to warmer for blow-by O2. Improved quickly. Taken back for Skin-to-skin w/ mom. APGAR: 8, 9; weight  pending.  Pitocin bolus started soon after delivery in infant. Large gush of blood. Placenta not able to be delivered with gentle cord traction so placenta removed manually.  Cord: 3 vessels with the following complications: loose nuchal x 1. Was adherent to anterior fundus.TXA given during manual removal. Bleeding slowed quickly. Second sweep performed and there did not feel like any further adherent placental fragments. Unasyn x 1 given due to manual removal. Discussed w/ pt that I would normally recommend Methergine series after manual removal of adherent placenta but since it in contraindicated w/ pts w/ HTN, I recommend Cytotec prophylactically.  Cord pH: NA  Anesthesia: Epidural Episiotomy: None Lacerations: Superficial, hemostatic, no repair indicated. Small hematoma noted at anterior fourchette. Suture Repair:  NA Est. Blood Loss (mL): 525 ml   Mom to postpartum.  Baby to Couplet care / Skin to Skin. Placenta to: LD Feeding: Breast Circ: No Contraception: OP IUD  CHTN:  - Few severe-range BPs during epidural placement and one afterward. Attending aware. Low threshold for treating as superimposed Pre-E w/ severe features and starting Magnesium Sulfate. Instructed RN to notify on-coming doctor of any further severe-range BPs during recovery and will transfer pt to [facility] and start Mag Sulfate.  - Start  Amlodipine and Lasix.   Hematoma -  Instructed pt and nurse to apply ice gloves to help prevent enlargement and pain from hematoma.   Manual Removal of Adherent Placenta  - Unasyn 3 gm x 1 - Cytotec given prophylactically - CTO for bleeding and infection closely  Dorathy Kinsman 08/05/2023, 9:27 PM

## 2022-10-02 ENCOUNTER — Encounter: Payer: Self-pay | Admitting: Advanced Practice Midwife

## 2022-12-01 ENCOUNTER — Ambulatory Visit (INDEPENDENT_AMBULATORY_CARE_PROVIDER_SITE_OTHER): Payer: BC Managed Care – PPO

## 2022-12-01 VITALS — BP 130/81

## 2022-12-01 DIAGNOSIS — N926 Irregular menstruation, unspecified: Secondary | ICD-10-CM

## 2022-12-01 LAB — POCT URINE PREGNANCY: Preg Test, Ur: POSITIVE — AB

## 2022-12-01 NOTE — Progress Notes (Signed)
Cassandra Hoover here for a UPT. Pt had a positive upt at home ( faint) . LMP is 3.22.24    UPT in office Positive.    Reviewed medications and informed to start a PNV, if not already. Pt to follow up in 6+ weeks for New OB visit.    Terance Hart, CMA

## 2022-12-29 ENCOUNTER — Ambulatory Visit (INDEPENDENT_AMBULATORY_CARE_PROVIDER_SITE_OTHER): Payer: BC Managed Care – PPO | Admitting: *Deleted

## 2022-12-29 ENCOUNTER — Other Ambulatory Visit (INDEPENDENT_AMBULATORY_CARE_PROVIDER_SITE_OTHER): Payer: BC Managed Care – PPO

## 2022-12-29 VITALS — BP 124/79 | HR 87 | Wt 193.0 lb

## 2022-12-29 DIAGNOSIS — O09521 Supervision of elderly multigravida, first trimester: Secondary | ICD-10-CM

## 2022-12-29 DIAGNOSIS — Z3A01 Less than 8 weeks gestation of pregnancy: Secondary | ICD-10-CM

## 2022-12-29 DIAGNOSIS — O0990 Supervision of high risk pregnancy, unspecified, unspecified trimester: Secondary | ICD-10-CM | POA: Insufficient documentation

## 2022-12-29 DIAGNOSIS — Z3481 Encounter for supervision of other normal pregnancy, first trimester: Secondary | ICD-10-CM | POA: Diagnosis not present

## 2022-12-29 DIAGNOSIS — O3680X0 Pregnancy with inconclusive fetal viability, not applicable or unspecified: Secondary | ICD-10-CM

## 2022-12-29 DIAGNOSIS — Z3480 Encounter for supervision of other normal pregnancy, unspecified trimester: Secondary | ICD-10-CM

## 2022-12-29 DIAGNOSIS — O09529 Supervision of elderly multigravida, unspecified trimester: Secondary | ICD-10-CM | POA: Insufficient documentation

## 2022-12-29 NOTE — Progress Notes (Signed)
New OB Intake  I explained I am completing New OB Intake today. We discussed her EDD of 08/12/2023 that is based on today's scan. LMP of 10/30/22. Pt is G2/P1. I reviewed her allergies, medications, Medical/Surgical/OB history, and appropriate screenings.   Patient Active Problem List   Diagnosis Date Noted   Supervision of high risk pregnancy, antepartum 12/29/2022   Advanced maternal age in multigravida 12/29/2022   Chronic hypertension in pregnancy 07/21/2021   Coarctation of aorta, congenital    Aortic valve disorder 04/27/2013    Concerns addressed today  Delivery Plans:  Plans to deliver at [facility].   MyChart/Babyscripts MyChart access verified. I explained pt will have some visits in office and some virtually. Babyscripts app discussed and ordered.   Blood Pressure Cuff  BP cuff discussed and has one at home  Discussed to be used for virtual visits and or if needed BP checks weekly.    Anatomy US Explained first scheduled [place] will be around 19 weeks.   Labs Discussed Avelina Laine genetic screening with patient. Would like Panorama with RH factor drawn at new OB visit. Had Horizon with previous pregnancy.   Routine prenatal labs collected today.     Placed OB Box on problem list and updated   Patient informed that the ultrasound is considered a limited obstetric ultrasound and is not intended to be a complete ultrasound exam.  Patient also informed that the ultrasound is not being completed with the intent of assessing for fetal or placental anomalies or any pelvic abnormalities. Explained that the purpose of today's ultrasound is to assess for dating and fetal heart rate.  Patient acknowledges the purpose of the exam and the limitations of the study.      First visit review I reviewed new OB appt with pt. Explained pt will be seen by Thalia Bloodgood, CNM at first visit.    Scheryl Marten, RN 12/29/2022  1:54 PM

## 2022-12-30 LAB — CBC/D/PLT+RPR+RH+ABO+RUBIGG...
Antibody Screen: NEGATIVE
Basophils Absolute: 0 10*3/uL (ref 0.0–0.2)
Basos: 0 %
EOS (ABSOLUTE): 0.2 10*3/uL (ref 0.0–0.4)
Eos: 3 %
HCV Ab: NONREACTIVE
HIV Screen 4th Generation wRfx: NONREACTIVE
Hematocrit: 37.9 % (ref 34.0–46.6)
Hemoglobin: 12.8 g/dL (ref 11.1–15.9)
Hepatitis B Surface Ag: NEGATIVE
Immature Grans (Abs): 0 10*3/uL (ref 0.0–0.1)
Immature Granulocytes: 0 %
Lymphocytes Absolute: 2.3 10*3/uL (ref 0.7–3.1)
Lymphs: 33 %
MCH: 30.4 pg (ref 26.6–33.0)
MCHC: 33.8 g/dL (ref 31.5–35.7)
MCV: 90 fL (ref 79–97)
Monocytes Absolute: 0.6 10*3/uL (ref 0.1–0.9)
Monocytes: 9 %
Neutrophils Absolute: 3.7 10*3/uL (ref 1.4–7.0)
Neutrophils: 55 %
Platelets: 305 10*3/uL (ref 150–450)
RBC: 4.21 x10E6/uL (ref 3.77–5.28)
RDW: 12.3 % (ref 11.7–15.4)
RPR Ser Ql: NONREACTIVE
Rh Factor: NEGATIVE
Rubella Antibodies, IGG: 1.88 index (ref >0.99)
WBC: 6.9 10*3/uL (ref 3.4–10.8)

## 2022-12-30 LAB — COMPREHENSIVE METABOLIC PANEL
ALT: 14 IU/L (ref 0–32)
AST: 13 IU/L (ref 0–40)
Albumin/Globulin Ratio: 1.8 (ref 1.2–2.2)
Albumin: 4.4 g/dL (ref 3.9–4.9)
Alkaline Phosphatase: 73 IU/L (ref 44–121)
BUN/Creatinine Ratio: 8 — ABNORMAL LOW (ref 9–23)
BUN: 5 mg/dL — ABNORMAL LOW (ref 6–20)
Bilirubin Total: 0.3 mg/dL (ref 0.0–1.2)
CO2: 20 mmol/L (ref 20–29)
Calcium: 9.7 mg/dL (ref 8.7–10.2)
Chloride: 102 mmol/L (ref 96–106)
Creatinine, Ser: 0.61 mg/dL (ref 0.57–1.00)
Globulin, Total: 2.4 g/dL (ref 1.5–4.5)
Glucose: 82 mg/dL (ref 70–99)
Potassium: 4 mmol/L (ref 3.5–5.2)
Sodium: 136 mmol/L (ref 134–144)
Total Protein: 6.8 g/dL (ref 6.0–8.5)
eGFR: 119 mL/min/{1.73_m2} (ref >59)

## 2022-12-30 LAB — PROTEIN / CREATININE RATIO, URINE
Creatinine, Urine: 33.9 mg/dL
Protein, Ur: <4 mg/dL

## 2022-12-30 LAB — HCV INTERPRETATION

## 2022-12-31 LAB — URINE CULTURE, OB REFLEX

## 2022-12-31 LAB — CULTURE, OB URINE

## 2023-01-21 ENCOUNTER — Other Ambulatory Visit (HOSPITAL_COMMUNITY): Admission: RE | Admit: 2023-01-21 | Payer: BC Managed Care – PPO | Source: Ambulatory Visit

## 2023-01-21 ENCOUNTER — Ambulatory Visit (INDEPENDENT_AMBULATORY_CARE_PROVIDER_SITE_OTHER): Payer: BC Managed Care – PPO | Admitting: Advanced Practice Midwife

## 2023-01-21 ENCOUNTER — Encounter: Payer: Self-pay | Admitting: Advanced Practice Midwife

## 2023-01-21 VITALS — BP 118/73 | HR 94 | Wt 192.0 lb

## 2023-01-21 DIAGNOSIS — Z1339 Encounter for screening examination for other mental health and behavioral disorders: Secondary | ICD-10-CM

## 2023-01-21 DIAGNOSIS — Q251 Coarctation of aorta: Secondary | ICD-10-CM

## 2023-01-21 DIAGNOSIS — O09521 Supervision of elderly multigravida, first trimester: Secondary | ICD-10-CM

## 2023-01-21 DIAGNOSIS — Z3A11 11 weeks gestation of pregnancy: Secondary | ICD-10-CM

## 2023-01-21 DIAGNOSIS — O0990 Supervision of high risk pregnancy, unspecified, unspecified trimester: Secondary | ICD-10-CM

## 2023-01-21 MED ORDER — ASPIRIN 81 MG PO TBEC: 81.0000 mg | DELAYED_RELEASE_TABLET | Freq: Every day | ORAL | 2 refills | Status: DC

## 2023-01-21 NOTE — Progress Notes (Signed)
New OB   Needs Pap GC/CT  and Genetic Screening OB labs done 12/29/22.

## 2023-01-21 NOTE — Patient Instructions (Signed)

## 2023-01-22 LAB — CYTOLOGY - PAP
Chlamydia: NEGATIVE
Comment: NEGATIVE
Comment: NEGATIVE
Comment: NORMAL
Diagnosis: NEGATIVE
High risk HPV: NEGATIVE
Neisseria Gonorrhea: NEGATIVE

## 2023-01-22 NOTE — Progress Notes (Signed)
History:   Cassandra Hoover is a 37 y.o. G2P1001 at [redacted]w[redacted]d by early ultrasound being seen today for her first obstetrical visit.  Her obstetrical history is significant for advanced maternal age, chronic hypertension on Labetalol and congenital coarctation of the aorta. Patient does intend to breast feed. Pregnancy history fully reviewed.  Patient reports no complaints. This is a planned pregnancy and she and her husband are very excited.      HISTORY: OB History  Gravida Para Term Preterm AB Living  2 1 1  0 0 1  SAB IAB Ectopic Multiple Live Births  0 0 0 0 1    # Outcome Date GA Lbr Len/2nd Weight Sex Delivery Anes PTL Lv  2 Current           1 Term 06/06/20 [redacted]w[redacted]d 07:47 / 00:44 5 lb 13.7 oz (2.656 kg) F Vag-Spont EPI  LIV     Birth Comments: wnl     Name: Faul,GIRL Juliany     Apgar1: 8  Apgar5: 9    Last pap smear was done 07/2019 and was normal  Past Medical History:  Diagnosis Date   Coarctation of aorta 05/17/2013   Since childhood, Coarct 1.7cmX1.8cm   Coarctation of aorta, congenital    surgical repair at 3 days old and 13 years   Gestational hypertension, third trimester 06/05/2020   Obstetric vaginal laceration with second degree perineal laceration 06/06/2020   Rh negative state in antepartum period 11/27/2019   Past Surgical History:  Procedure Laterality Date   BREAST ENHANCEMENT SURGERY     COARCTATION OF AORTA REPAIR     REFRACTIVE SURGERY     TONSILLECTOMY     Family History  Problem Relation Age of Onset   Heart failure Maternal Grandmother    Social History   Tobacco Use   Smoking status: Never   Smokeless tobacco: Never  Vaping Use   Vaping Use: Never used  Substance Use Topics   Alcohol use: Not Currently    Alcohol/week: 3.0 - 4.0 standard drinks of alcohol    Types: 3 - 4 Glasses of wine per week    Comment: socially   Drug use: Never   Allergies  Allergen Reactions   Sudafed [Pseudoephedrine] Palpitations   Current Outpatient  Medications on File Prior to Visit  Medication Sig Dispense Refill   labetalol (NORMODYNE) 100 MG tablet Take 100 mg by mouth 2 (two) times daily.     Prenatal MV & Min w/FA-DHA (ONE A DAY PRENATAL PO) Take 1 tablet by mouth daily.     sertraline (ZOLOFT) 25 MG tablet Take 75 mg by mouth daily.     amLODipine (NORVASC) 5 MG tablet Take 5 mg by mouth daily.     aspirin 81 MG chewable tablet Chew by mouth. (Patient not taking: Reported on 12/01/2022)     Blood Pressure Monitoring (BLOOD PRESSURE MONITOR AUTOMAT) DEVI 1 Device by Does not apply route daily. Automatic blood pressure cuff regular size. To monitor blood pressure regularly at home. ICD-10 code: O09.90 1 each 0   Cholecalciferol (D3-50 PO) Take 2,000 Units by mouth daily.      fluticasone (FLONASE) 50 MCG/ACT nasal spray Place into both nostrils daily as needed for allergies or rhinitis.     levonorgestrel (MIRENA) 20 MCG/DAY IUD by Intrauterine route.     Loratadine 10 MG CAPS Take by mouth.      Magnesium Oxide (MAG-OXIDE) 200 MG TABS Take 2 tablets (400 mg total) by mouth at  bedtime. If that amount causes loose stools in the am, switch to 200mg  daily at bedtime. (Patient taking differently: Take 400 mg by mouth at bedtime.) 60 tablet 3   No current facility-administered medications on file prior to visit.    Review of Systems Pertinent items noted in HPI and remainder of comprehensive ROS otherwise negative. Physical Exam:   Vitals:   01/21/23 1020  BP: 118/73  Pulse: 94  Weight: 192 lb (87.1 kg)   Fetal Heart Rate (bpm): 158 Uterus:     Pelvic Exam: Perineum: no hemorrhoids, normal perineum   Vulva: normal external genitalia, no lesions   Vagina:  normal mucosa, normal discharge   Cervix: no lesions and normal, pap smear done.    Adnexa: normal adnexa and no mass, fullness, tenderness   Bony Pelvis: average  System: General: well-developed, well-nourished female in no acute distress   Breasts:  normal appearance, no  masses or tenderness bilaterally   Skin: normal coloration and turgor, no rashes   Neurologic: oriented, normal, negative, normal mood   Extremities: normal strength, tone, and muscle mass, ROM of all joints is normal   HEENT PERRLA, extraocular movement intact and sclera clear, anicteric   Mouth/Teeth mucous membranes moist, pharynx normal without lesions and dental hygiene good   Neck supple and no masses   Cardiovascular: regular rate and rhythm   Respiratory:  no respiratory distress, normal breath sounds   Abdomen: soft, non-tender; bowel sounds normal; no masses,  no organomegaly     Assessment:    Pregnancy: G2P1001 Patient Active Problem List   Diagnosis Date Noted   Supervision of high risk pregnancy, antepartum 12/29/2022   Advanced maternal age in multigravida 12/29/2022   Chronic hypertension in pregnancy 07/21/2021   Coarctation of aorta, congenital    Aortic valve disorder 04/27/2013      Plan:    1. Supervision of high risk pregnancy, antepartum - So excited to see this patient after fertility-based appointment last year!! - Cytology - PAP - PANORAMA PRENATAL TEST - [place] MFM OB DETAIL +14 WK; Future  2. [redacted] weeks gestation of pregnancy   3. Multigravida of advanced maternal age in first trimester - Age less than 40 at delivery  4. Coarctation of aorta, congenital    Initial labs drawn. Continue prenatal vitamins. Genetic Screening discussed, First trimester screen, Quad screen, and NIPS: ordered. Ultrasound discussed; fetal anatomic survey: ordered. Problem list reviewed and updated. The nature of [facility] with multiple MDs and other Advanced Practice Providers was explained to patient; also emphasized that residents, students are part of our team. Routine obstetric precautions reviewed.   Patient desires midwifery-led care and requests transfer to [facility]. Next appointments coordinated accordingly  Clayton Bibles, MSN, CNM Certified Nurse Midwife, [facility] for [facility], [facility] 01/22/23 2:43 PM

## 2023-02-01 LAB — PANORAMA PRENATAL TEST FULL PANEL:PANORAMA TEST PLUS 5 ADDITIONAL MICRODELETIONS: FETAL FRACTION: 5.1

## 2023-02-16 NOTE — Progress Notes (Unsigned)
   PRENATAL VISIT NOTE  Subjective:  Cassandra Hoover is a 37 y.o. G2P1001 at [redacted]w[redacted]d being seen today for ongoing prenatal care.  She is currently monitored for the following issues for this high-risk pregnancy and has Aortic valve disorder; Coarctation of aorta, congenital; Chronic hypertension in pregnancy; Supervision of high risk pregnancy, antepartum; and Advanced maternal age in multigravida on their problem list.  Patient reports {sx:14538}.   .  .   . Denies leaking of fluid.   The following portions of the patient's history were reviewed and updated as appropriate: allergies, current medications, past family history, past medical history, past social history, past surgical history and problem list.   Objective:  There were no vitals filed for this visit.  Fetal Status:           General:  Alert, oriented and cooperative. Patient is in no acute distress.  Skin: Skin is warm and dry. No rash noted.   Cardiovascular: Normal heart rate noted  Respiratory: Normal respiratory effort, no problems with respiration noted  Abdomen: Soft, gravid, appropriate for gestational age.        Pelvic: Cervical exam deferred        Extremities: Normal range of motion.     Mental Status: Normal mood and affect. Normal behavior. Normal judgment and thought content.   Assessment and Plan:  Pregnancy: G2P1001 at [redacted]w[redacted]d 1. Supervision of high risk pregnancy in second trimester - Doing well overall. ***  2. [redacted] weeks gestation of pregnancy - Routine OB care   3. Chronic hypertension in pregnancy - Stable on daily labetalol - Referred to OB Cardio for pregnancy cardiovascular care  4. Multigravida of advanced maternal age in second trimester - Can start taking low dose aspirin again  Preterm labor symptoms and general obstetric precautions including but not limited to vaginal bleeding, contractions, leaking of fluid and fetal movement were reviewed in detail with the patient. Please refer to After  Visit Summary for other counseling recommendations.   No follow-ups on file.  Future Appointments  Date Time Provider Department Center  02/17/2023  9:35 AM Osborne Oman [facility]  03/18/2023  9:45 AM [facility] NURSE [facility]  03/18/2023 10:00 AM [facility] US1 [facility]  07/23/2023  8:20 AM Jodelle Red, MD [facility]    Bernerd Limbo, CNM

## 2023-02-17 ENCOUNTER — Other Ambulatory Visit (HOSPITAL_COMMUNITY): Admission: RE | Admit: 2023-02-17 | Payer: BC Managed Care – PPO | Source: Ambulatory Visit

## 2023-02-17 ENCOUNTER — Other Ambulatory Visit: Payer: Self-pay

## 2023-02-17 ENCOUNTER — Ambulatory Visit (INDEPENDENT_AMBULATORY_CARE_PROVIDER_SITE_OTHER): Payer: BC Managed Care – PPO | Admitting: Certified Nurse Midwife

## 2023-02-17 VITALS — BP 122/78 | HR 75 | Wt 191.7 lb

## 2023-02-17 DIAGNOSIS — O0990 Supervision of high risk pregnancy, unspecified, unspecified trimester: Secondary | ICD-10-CM

## 2023-02-17 DIAGNOSIS — N939 Abnormal uterine and vaginal bleeding, unspecified: Secondary | ICD-10-CM | POA: Diagnosis present

## 2023-02-17 DIAGNOSIS — B3731 Acute candidiasis of vulva and vagina: Secondary | ICD-10-CM

## 2023-02-17 DIAGNOSIS — O09522 Supervision of elderly multigravida, second trimester: Secondary | ICD-10-CM

## 2023-02-17 DIAGNOSIS — K589 Irritable bowel syndrome without diarrhea: Secondary | ICD-10-CM | POA: Insufficient documentation

## 2023-02-17 DIAGNOSIS — O0992 Supervision of high risk pregnancy, unspecified, second trimester: Secondary | ICD-10-CM

## 2023-02-17 DIAGNOSIS — O10919 Unspecified pre-existing hypertension complicating pregnancy, unspecified trimester: Secondary | ICD-10-CM

## 2023-02-17 DIAGNOSIS — O10912 Unspecified pre-existing hypertension complicating pregnancy, second trimester: Secondary | ICD-10-CM

## 2023-02-17 DIAGNOSIS — F411 Generalized anxiety disorder: Secondary | ICD-10-CM

## 2023-02-17 DIAGNOSIS — Z3A14 14 weeks gestation of pregnancy: Secondary | ICD-10-CM

## 2023-02-17 MED ORDER — SERTRALINE HCL 25 MG PO TABS
75.0000 mg | ORAL_TABLET | Freq: Every day | ORAL | 11 refills | Status: DC
Start: 2023-02-17 — End: 2023-03-15

## 2023-02-18 ENCOUNTER — Encounter: Payer: BC Managed Care – PPO | Admitting: Obstetrics & Gynecology

## 2023-02-18 LAB — CERVICOVAGINAL ANCILLARY ONLY
Bacterial Vaginitis (gardnerella): NEGATIVE
Candida Glabrata: NEGATIVE
Candida Vaginitis: POSITIVE — AB
Chlamydia: NEGATIVE
Comment: NEGATIVE
Comment: NEGATIVE
Comment: NEGATIVE
Comment: NEGATIVE
Comment: NEGATIVE
Comment: NORMAL
Neisseria Gonorrhea: NEGATIVE
Trichomonas: NEGATIVE

## 2023-02-18 MED ORDER — TERCONAZOLE 0.4 % VA CREA
1.0000 | TOPICAL_CREAM | Freq: Every day | VAGINAL | 0 refills | Status: DC
Start: 2023-02-18 — End: 2023-03-17

## 2023-02-18 NOTE — Addendum Note (Signed)
Addended by: Edd Arbour on: 02/18/2023 10:07 PM   Modules accepted: Orders

## 2023-03-15 ENCOUNTER — Other Ambulatory Visit: Payer: Self-pay

## 2023-03-15 DIAGNOSIS — F411 Generalized anxiety disorder: Secondary | ICD-10-CM

## 2023-03-15 MED ORDER — SERTRALINE HCL 25 MG PO TABS: 75.0000 mg | ORAL_TABLET | Freq: Every day | ORAL | 6 refills | Status: DC

## 2023-03-15 NOTE — Telephone Encounter (Signed)
Received fax from [facility] pharmacy requesting a 90 day supply prescription for Zoloft 25 mg tablet.  Message routed to Edd Arbour, CNM for advisement.

## 2023-03-16 NOTE — Progress Notes (Unsigned)
   PRENATAL VISIT NOTE  Subjective:  Cassandra Hoover is a 37 y.o. G2P1001 at [redacted]w[redacted]d being seen today for ongoing prenatal care.  She is currently monitored for the following issues for this {Blank single:19197::"high-risk","low-risk"} pregnancy and has Aortic valve disorder; Coarctation of aorta, congenital; Chronic hypertension in pregnancy; Supervision of high risk pregnancy, antepartum; Advanced maternal age in multigravida; Generalized anxiety disorder; and Irritable bowel syndrome on their problem list.  Patient reports {sx:14538}.   .  .   . Denies leaking of fluid.   The following portions of the patient's history were reviewed and updated as appropriate: allergies, current medications, past family history, past medical history, past social history, past surgical history and problem list.   Objective:  There were no vitals filed for this visit.  Fetal Status:           General:  Alert, oriented and cooperative. Patient is in no acute distress.  Skin: Skin is warm and dry. No rash noted.   Cardiovascular: Normal heart rate noted  Respiratory: Normal respiratory effort, no problems with respiration noted  Abdomen: Soft, gravid, appropriate for gestational age.        Pelvic: {Blank single:19197::"Cervical exam performed in the presence of a chaperone","Cervical exam deferred"}        Extremities: Normal range of motion.     Mental Status: Normal mood and affect. Normal behavior. Normal judgment and thought content.   Assessment and Plan:  Pregnancy: G2P1001 at [redacted]w[redacted]d 1. Supervision of high risk pregnancy in second trimester ***  2. [redacted] weeks gestation of pregnancy ***  3. Chronic hypertension in pregnancy ***  4. Multigravida of advanced maternal age in second trimester ***  5. Generalized anxiety disorder ***  {Blank single:19197::"Term","Preterm"} labor symptoms and general obstetric precautions including but not limited to vaginal bleeding, contractions, leaking of fluid  and fetal movement were reviewed in detail with the patient. Please refer to After Visit Summary for other counseling recommendations.   No follow-ups on file.  Future Appointments  Date Time Provider Department Center  03/17/2023 10:55 AM Osborne Oman [facility]  03/18/2023  9:45 AM [facility] NURSE [facility]  03/18/2023 10:00 AM [facility] US1 [facility]  03/19/2023  1:40 PM Tobb, Kardie, DO [facility] None  07/23/2023  8:20 AM Jodelle Red, MD [facility]    Bernerd Limbo, CNM

## 2023-03-17 ENCOUNTER — Other Ambulatory Visit: Payer: Self-pay

## 2023-03-17 ENCOUNTER — Ambulatory Visit (INDEPENDENT_AMBULATORY_CARE_PROVIDER_SITE_OTHER): Payer: BC Managed Care – PPO | Admitting: Certified Nurse Midwife

## 2023-03-17 VITALS — BP 115/75 | HR 87 | Wt 191.0 lb

## 2023-03-17 DIAGNOSIS — O0992 Supervision of high risk pregnancy, unspecified, second trimester: Secondary | ICD-10-CM

## 2023-03-17 DIAGNOSIS — Z3A18 18 weeks gestation of pregnancy: Secondary | ICD-10-CM

## 2023-03-17 DIAGNOSIS — O09522 Supervision of elderly multigravida, second trimester: Secondary | ICD-10-CM

## 2023-03-17 DIAGNOSIS — O10912 Unspecified pre-existing hypertension complicating pregnancy, second trimester: Secondary | ICD-10-CM

## 2023-03-17 DIAGNOSIS — O10919 Unspecified pre-existing hypertension complicating pregnancy, unspecified trimester: Secondary | ICD-10-CM

## 2023-03-17 DIAGNOSIS — F411 Generalized anxiety disorder: Secondary | ICD-10-CM

## 2023-03-18 ENCOUNTER — Ambulatory Visit: Payer: BC Managed Care – PPO | Admitting: *Deleted

## 2023-03-18 ENCOUNTER — Other Ambulatory Visit: Payer: Self-pay | Admitting: *Deleted

## 2023-03-18 ENCOUNTER — Ambulatory Visit: Payer: BC Managed Care – PPO

## 2023-03-18 ENCOUNTER — Encounter: Payer: Self-pay | Admitting: *Deleted

## 2023-03-18 VITALS — BP 110/63 | HR 90

## 2023-03-18 DIAGNOSIS — O10912 Unspecified pre-existing hypertension complicating pregnancy, second trimester: Secondary | ICD-10-CM

## 2023-03-18 DIAGNOSIS — Q251 Coarctation of aorta: Secondary | ICD-10-CM | POA: Diagnosis not present

## 2023-03-18 DIAGNOSIS — O43192 Other malformation of placenta, second trimester: Secondary | ICD-10-CM | POA: Diagnosis not present

## 2023-03-18 DIAGNOSIS — Z3A19 19 weeks gestation of pregnancy: Secondary | ICD-10-CM

## 2023-03-18 DIAGNOSIS — Z362 Encounter for other antenatal screening follow-up: Secondary | ICD-10-CM

## 2023-03-18 DIAGNOSIS — O0990 Supervision of high risk pregnancy, unspecified, unspecified trimester: Secondary | ICD-10-CM | POA: Insufficient documentation

## 2023-03-18 DIAGNOSIS — O09522 Supervision of elderly multigravida, second trimester: Secondary | ICD-10-CM

## 2023-03-18 DIAGNOSIS — O10012 Pre-existing essential hypertension complicating pregnancy, second trimester: Secondary | ICD-10-CM | POA: Diagnosis not present

## 2023-03-19 ENCOUNTER — Ambulatory Visit (INDEPENDENT_AMBULATORY_CARE_PROVIDER_SITE_OTHER): Payer: BC Managed Care – PPO | Admitting: Cardiology

## 2023-03-19 ENCOUNTER — Encounter: Payer: Self-pay | Admitting: Cardiology

## 2023-03-19 VITALS — BP 122/72 | HR 85 | Ht 67.0 in | Wt 191.6 lb

## 2023-03-19 DIAGNOSIS — O132 Gestational [pregnancy-induced] hypertension without significant proteinuria, second trimester: Secondary | ICD-10-CM

## 2023-03-19 DIAGNOSIS — Z3A19 19 weeks gestation of pregnancy: Secondary | ICD-10-CM

## 2023-03-19 DIAGNOSIS — Q251 Coarctation of aorta: Secondary | ICD-10-CM | POA: Diagnosis not present

## 2023-03-19 DIAGNOSIS — R0609 Other forms of dyspnea: Secondary | ICD-10-CM

## 2023-03-19 NOTE — Patient Instructions (Signed)
Medication Instructions:  Your physician recommends that you continue on your current medications as directed. Please refer to the Current Medication list given to you today.  *If you need a refill on your cardiac medications before your next appointment, please call your pharmacy*   Lab Work: None   Testing/Procedures: Your physician has requested that you have an echocardiogram - OB. Echocardiography is a painless test that uses sound waves to create images of your heart. It provides your doctor with information about the size and shape of your heart and how well your heart's chambers and valves are working. This procedure takes approximately one hour. There are no restrictions for this procedure. Please do NOT wear cologne, perfume, aftershave, or lotions (deodorant is allowed). Please arrive 15 minutes prior to your appointment time.     Follow-Up: At [facility], you and your health needs are our priority.  As part of our continuing mission to provide you with exceptional heart care, we have created designated Provider Care Teams.  These Care Teams include your primary Cardiologist (physician) and Advanced Practice Providers (APPs -  Physician Assistants and Nurse Practitioners) who all work together to provide you with the care you need, when you need it.   Your next appointment:   8 week(s)  Provider:   Thomasene Ripple, DO

## 2023-03-19 NOTE — Progress Notes (Unsigned)
Cardio-Obstetrics Clinic  New Evaluation  Date:  03/19/2023   ID:  Cassandra Hoover, DOB 02/01/1986, MRN [id number]  PCP:  Macy Mis, MD   [facility] Providers Cardiologist:  Jodelle Red, MD  Electrophysiologist:  None   { Click to update primary MD,subspecialty MD or APP then REFRESH:1}    Referring MD: Bernerd Limbo, CNM   Chief Complaint: ***  History of Present Illness:    Cassandra Hoover is a 37 y.o. female [G2P1001] who is being seen today for the evaluation of *** at the request of Bernerd Limbo, CNM.    Prior CV Studies Reviewed: The following studies were reviewed today: Cardiac MR 09/2019 CONTRAST:  [id number]   FINDINGS: Normal LA/RA size. Normal RV size and function No ASD/PFO/VSD. No pericardial effusion normal MV,TV. Aortic valve is tri-leaflet with no significant stenosis or regurgitation. Normal LV size and function. Quantitative EF 59% (EDV 114 cc ESV 47 cc SV 68 cc) Septal thickness normal 8 mm no LVH. Delayed gadolinium images showed no hyper-enhancement of the LV myocardium   Aorta: There is fusiform relative dilatation of the ascending aortic root. The great vessels exit the arch normally The arch itself is relatively hypoplastic. Just distal to the left subclavian take off there is residual "pseudo coarctation" at the likely sight of previous repair. This area narrows down to a diameter of 13 mm. Flow analysis through this area precisely was not performed   Aortic Sinus: 28 mm   STJ 25 mm   Aortic Root: 34 mm   Aortic Arch 18 mm   Pseudo Coarctation: 13 mm just distal to left subclavian take off   Proximal descending thoracic aorta 24 mm   Distal descending thoracic aorta 17 mm   IMPRESSION: 1. Normal tri- leaflet aortic valve   2. Relative dilatation of the ascending aortic root at 34 mm with hypoplastic arch 17 mm. Residual "pseudo-coarctation" likely at site of previous repair with residual minimal luminal  diameter of 13 mm. Flow analysis through the smallest area not performed   3.  Normal LV size and function EF 59%   4.  No delayed LV myocardial enhancement post gadolinium  Past Medical History:  Diagnosis Date   Coarctation of aorta 05/17/2013   Since childhood, Coarct 1.7cmX1.8cm   Coarctation of aorta, congenital    surgical repair at 74 days old and 13 years   Gestational hypertension, third trimester 06/05/2020   Obstetric vaginal laceration with second degree perineal laceration 06/06/2020   Rh negative state in antepartum period 11/27/2019    Past Surgical History:  Procedure Laterality Date   BREAST ENHANCEMENT SURGERY     COARCTATION OF AORTA REPAIR     REFRACTIVE SURGERY     TONSILLECTOMY     { Click here to update PMH, PSH, OB Hx then refresh note  :1}   OB History     Gravida  2   Para  1   Term  1   Preterm      AB      Living  1      SAB      IAB      Ectopic      Multiple  0   Live Births  1           { Click here to update OB Charting then refresh note  :1}    Current Medications: Current Meds  Medication Sig   aspirin EC 81 MG tablet Take  1 tablet (81 mg total) by mouth daily. Take after 12 weeks for prevention of preeclampsia later in pregnancy (Patient taking differently: Take 162 mg by mouth daily. Take after 12 weeks for prevention of preeclampsia later in pregnancy)   Blood Pressure Monitoring (BLOOD PRESSURE MONITOR AUTOMAT) DEVI 1 Device by Does not apply route daily. Automatic blood pressure cuff regular size. To monitor blood pressure regularly at home. ICD-10 code: O09.90   Cholecalciferol (D3-50 PO) Take 2,000 Units by mouth daily.    CHOLINE PO Take by mouth.   Ferrous Sulfate (IRON PO) Take by mouth.   labetalol (NORMODYNE) 100 MG tablet Take 100 mg by mouth 2 (two) times daily.   Loratadine 10 MG CAPS Take by mouth.    Magnesium Oxide (MAG-OXIDE) 200 MG TABS Take 2 tablets (400 mg total) by mouth at bedtime. If that  amount causes loose stools in the am, switch to 200mg  daily at bedtime. (Patient taking differently: Take 400 mg by mouth at bedtime.)   Prenatal MV & Min w/FA-DHA (ONE A DAY PRENATAL PO) Take 1 tablet by mouth daily.   PSYLLIUM HUSK PO Take by mouth.   sertraline (ZOLOFT) 25 MG tablet Take 3 tablets (75 mg total) by mouth daily.     Allergies:   Sudafed [pseudoephedrine]   Social History   Socioeconomic History   Marital status: Married    Spouse name: Not on file   Number of children: Not on file   Years of education: Not on file   Highest education level: Bachelor's degree (e.g., BA, AB, BS)  Occupational History   Not on file  Tobacco Use   Smoking status: Never   Smokeless tobacco: Never  Vaping Use   Vaping status: Never Used  Substance and Sexual Activity   Alcohol use: Not Currently    Alcohol/week: 3.0 - 4.0 standard drinks of alcohol    Types: 3 - 4 Glasses of wine per week    Comment: socially   Drug use: Never   Sexual activity: Yes  Other Topics Concern   Not on file  Social History Narrative   Not on file   Social Determinants of Health   Financial Resource Strain: Low Risk  (06/25/2022)   Received from [facility], [facility]   Overall Financial Resource Strain (CARDIA)    Difficulty of Paying Living Expenses: Not hard at all  Food Insecurity: No Food Insecurity (06/25/2022)   Received from [facility], [facility]   Hunger Vital Sign    Worried About Running Out of Food in the Last Year: Never true    Ran Out of Food in the Last Year: Never true  Transportation Needs: No Transportation Needs (06/01/2021)   Received from [employer], [facility]   PRAPARE - Transportation    Lack of Transportation (Medical): No    Lack of Transportation (Non-Medical): No  Physical Activity: Insufficiently Active (06/25/2022)   Received from [facility], [facility]   Exercise Vital Sign    Days of Exercise per Week: 2 days    Minutes of Exercise  per Session: 40 min  Stress: Stress Concern Present (06/25/2022)   Received from [facility], [facility] of Occupational Health - Occupational Stress Questionnaire    Feeling of Stress : To some extent  Social Connections: Socially Integrated (08/18/2022)   Received from [facility], [facility]   Social Network    How would you rate your social network (family, work, friends)?: Good  participation with social networks  { Click here to update SDOH then refresh :1}    Family History  Problem Relation Age of Onset   Heart failure Maternal Grandmother    { Click here to update FH then refresh note    :1}   ROS:   Please see the history of present illness.     All other systems reviewed and are negative.   Labs/EKG Reviewed:    EKG:   EKG is  ordered today.  The ekg ordered today demonstrates   Recent Labs: 12/29/2022: ALT 14; BUN 5; Creatinine, Ser 0.61; Hemoglobin 12.8; Platelets 305; Potassium 4.0; Sodium 136   Recent Lipid Panel No results found for: "CHOL", "TRIG", "HDL", "CHOLHDL", "LDLCALC", "LDLDIRECT"  Physical Exam:    VS:  BP 126/82   Pulse 85   Ht 5\' 7"  (1.702 m)   Wt 191 lb 9.6 oz (86.9 kg)   LMP 10/30/2022   SpO2 98%   BMI 30.01 kg/m     Wt Readings from Last 3 Encounters:  03/19/23 191 lb 9.6 oz (86.9 kg)  03/17/23 191 lb (86.6 kg)  02/17/23 191 lb 11.2 oz (87 kg)     GEN:  Well nourished, well developed in no acute distress HEENT: Normal NECK: No JVD; No carotid bruits LYMPHATICS: No lymphadenopathy CARDIAC: RRR, no murmurs, rubs, gallops RESPIRATORY:  Clear to auscultation without rales, wheezing or rhonchi  ABDOMEN: Soft, non-tender, non-distended MUSCULOSKELETAL:  No edema; No deformity  SKIN: Warm and dry NEUROLOGIC:  Alert and oriented x 3 PSYCHIATRIC:  Normal affect    Risk Assessment/Risk Calculators:   { Click to calculate CARPREG II - THEN refresh note :1}    { Click to caclulate Mod WHO Class of CV  Risk - THEN refresh note :1}     { Click for CHADS2VASc Score - THEN Refresh Note    :[id number]}      ASSESSMENT & PLAN:    *** There are no Patient Instructions on file for this visit.   Dispo:  No follow-ups on file.   Medication Adjustments/Labs and Tests Ordered: Current medicines are reviewed at length with the patient today.  Concerns regarding medicines are outlined above.  Tests Ordered: No orders of the defined types were placed in this encounter.  Medication Changes: No orders of the defined types were placed in this encounter.

## 2023-03-30 ENCOUNTER — Ambulatory Visit: Payer: BC Managed Care – PPO | Attending: Cardiology | Admitting: Cardiology

## 2023-03-30 ENCOUNTER — Encounter: Payer: Self-pay | Admitting: Cardiology

## 2023-03-30 VITALS — BP 125/77 | HR 83 | Ht 67.0 in | Wt 191.4 lb

## 2023-03-30 DIAGNOSIS — Q251 Coarctation of aorta: Secondary | ICD-10-CM

## 2023-03-30 DIAGNOSIS — O10919 Unspecified pre-existing hypertension complicating pregnancy, unspecified trimester: Secondary | ICD-10-CM

## 2023-03-30 NOTE — Progress Notes (Signed)
Cardio-Obstetrics Clinic  New Evaluation  Date:  04/02/2023   ID:  Cassandra Hoover, DOB 1986-01-13, MRN [id number]  PCP:  Macy Mis, MD   [facility] Providers Cardiologist:  Jodelle Red, MD  Electrophysiologist:  None       Referring MD: Macy Mis, MD   Chief Complaint: " I am doing well"  She is at home. I am in the office   Virtual Visit via Video  Note . I connected with the patient today by a   video enabled telemedicine application and verified that I am speaking with the correct person using two identifiers.  History of Present Illness:    Cassandra Hoover is a 37 y.o. female [G2P1001] who is being seen today for the evaluation of coarctation of the aorta at the request of Briscoe, Sharrie Rothman, MD.   Chart review: Medical history includes coarctation of the aorta follows with Dr. Janne Napoleon was last seen by her in December 2022.  Is noted that her echo windows had been difficult since she had breast implants.  She has had a cardiac MRI which was done back in 2021 with normal EF with noted pseudo coarctation at the suspected repair site.  At her visit with Dr. Cristal Deer in December 2022 she denied any chest pain or shortness of breath and had normal exertion.  She had been titrated off the amlodipine due to her hypertension in pregnancy.  She has had some shortness of breath which is at the bare minimum and tells me mostly when she is interacting with her child going up the stairs.  But again with her second pregnancy she has had some elevated blood pressure Labetalol 100 mg has been started.  She was referred by her OB team to the cardio obstetrics clinic.  Shared with the patient that postpartum we will transition her back to see her primary cardiologist.  She is currently [redacted] weeks pregnant, since her last visit with me she has been doing well from a CV standpoint.  Prior CV Studies Reviewed: The following studies were reviewed today: Cardiac  MR 09/2019 CONTRAST:  [id number]   FINDINGS: Normal LA/RA size. Normal RV size and function No ASD/PFO/VSD. No pericardial effusion normal MV,TV. Aortic valve is tri-leaflet with no significant stenosis or regurgitation. Normal LV size and function. Quantitative EF 59% (EDV 114 cc ESV 47 cc SV 68 cc) Septal thickness normal 8 mm no LVH. Delayed gadolinium images showed no hyper-enhancement of the LV myocardium   Aorta: There is fusiform relative dilatation of the ascending aortic root. The great vessels exit the arch normally The arch itself is relatively hypoplastic. Just distal to the left subclavian take off there is residual "pseudo coarctation" at the likely sight of previous repair. This area narrows down to a diameter of 13 mm. Flow analysis through this area precisely was not performed   Aortic Sinus: 28 mm   STJ 25 mm   Aortic Root: 34 mm   Aortic Arch 18 mm   Pseudo Coarctation: 13 mm just distal to left subclavian take off   Proximal descending thoracic aorta 24 mm   Distal descending thoracic aorta 17 mm   IMPRESSION: 1. Normal tri- leaflet aortic valve   2. Relative dilatation of the ascending aortic root at 34 mm with hypoplastic arch 17 mm. Residual "pseudo-coarctation" likely at site of previous repair with residual minimal luminal diameter of 13 mm. Flow analysis through the smallest area not performed   3.  Normal  LV size and function EF 59%   4.  No delayed LV myocardial enhancement post gadolinium  Past Medical History:  Diagnosis Date   Coarctation of aorta 05/17/2013   Since childhood, Coarct 1.7cmX1.8cm   Coarctation of aorta, congenital    surgical repair at 108 days old and 13 years   Gestational hypertension, third trimester 06/05/2020   Obstetric vaginal laceration with second degree perineal laceration 06/06/2020   Rh negative state in antepartum period 11/27/2019    Past Surgical History:  Procedure Laterality Date   BREAST ENHANCEMENT  SURGERY     COARCTATION OF AORTA REPAIR     REFRACTIVE SURGERY     TONSILLECTOMY        OB History     Gravida  2   Para  1   Term  1   Preterm      AB      Living  1      SAB      IAB      Ectopic      Multiple  0   Live Births  1               Current Medications: Current Meds  Medication Sig   aspirin EC 81 MG tablet Take 1 tablet (81 mg total) by mouth daily. Take after 12 weeks for prevention of preeclampsia later in pregnancy (Patient taking differently: Take 162 mg by mouth daily. Take after 12 weeks for prevention of preeclampsia later in pregnancy)   Blood Pressure Monitoring (BLOOD PRESSURE MONITOR AUTOMAT) DEVI 1 Device by Does not apply route daily. Automatic blood pressure cuff regular size. To monitor blood pressure regularly at home. ICD-10 code: O09.90   Cholecalciferol (D3-50 PO) Take 2,000 Units by mouth daily.    CHOLINE PO Take by mouth.   Ferrous Sulfate (IRON PO) Take by mouth.   labetalol (NORMODYNE) 100 MG tablet Take 100 mg by mouth 2 (two) times daily.   Loratadine 10 MG CAPS Take by mouth.    Magnesium Oxide (MAG-OXIDE) 200 MG TABS Take 2 tablets (400 mg total) by mouth at bedtime. If that amount causes loose stools in the am, switch to 200mg  daily at bedtime. (Patient taking differently: Take 400 mg by mouth at bedtime.)   Prenatal MV & Min w/FA-DHA (ONE A DAY PRENATAL PO) Take 1 tablet by mouth daily.   PSYLLIUM HUSK PO Take by mouth.   sertraline (ZOLOFT) 25 MG tablet Take 3 tablets (75 mg total) by mouth daily.     Allergies:   Sudafed [pseudoephedrine]   Social History   Socioeconomic History   Marital status: Married    Spouse name: Not on file   Number of children: Not on file   Years of education: Not on file   Highest education level: Bachelor's degree (e.g., BA, AB, BS)  Occupational History   Not on file  Tobacco Use   Smoking status: Never   Smokeless tobacco: Never  Vaping Use   Vaping status: Never Used   Substance and Sexual Activity   Alcohol use: Not Currently    Alcohol/week: 3.0 - 4.0 standard drinks of alcohol    Types: 3 - 4 Glasses of wine per week    Comment: socially   Drug use: Never   Sexual activity: Yes  Other Topics Concern   Not on file  Social History Narrative   Not on file   Social Determinants of Health   Financial Resource Strain: Low Risk  (  06/25/2022)   Received from [facility], [facility]   Overall Financial Resource Strain (CARDIA)    Difficulty of Paying Living Expenses: Not hard at all  Food Insecurity: No Food Insecurity (06/25/2022)   Received from [facility], [facility]   Hunger Vital Sign    Worried About Running Out of Food in the Last Year: Never true    Ran Out of Food in the Last Year: Never true  Transportation Needs: No Transportation Needs (06/01/2021)   Received from [facility], [facility]   [facility] - Transportation    Lack of Transportation (Medical): No    Lack of Transportation (Non-Medical): No  Physical Activity: Insufficiently Active (06/25/2022)   Received from [facility], [facility]   Exercise Vital Sign    Days of Exercise per Week: 2 days    Minutes of Exercise per Session: 40 min  Stress: Stress Concern Present (06/25/2022)   Received from [facility], [facility] of Occupational Health - Occupational Stress Questionnaire    Feeling of Stress : To some extent  Social Connections: Socially Integrated (08/18/2022)   Received from [facility], [facility]   Social Network    How would you rate your social network (family, work, friends)?: Good participation with social networks      Family History  Problem Relation Age of Onset   Heart failure Maternal Grandmother       ROS:   Please see the history of present illness.     All other systems reviewed and are negative.   Labs/EKG Reviewed:    EKG:   EKG is  ordered today.  The ekg ordered today demonstrates    Recent Labs: 12/29/2022: ALT 14; BUN 5; Creatinine, Ser 0.61; Hemoglobin 12.8; Platelets 305; Potassium 4.0; Sodium 136   Recent Lipid Panel No results found for: "CHOL", "TRIG", "HDL", "CHOLHDL", "LDLCALC", "LDLDIRECT"  Physical Exam:    VS:  BP 125/77   Pulse 83   Ht 5\' 7"  (1.702 m)   Wt 191 lb 6.4 oz (86.8 kg)   LMP 10/30/2022   BMI 29.98 kg/m     Wt Readings from Last 3 Encounters:  03/30/23 191 lb 6.4 oz (86.8 kg)  03/19/23 191 lb 9.6 oz (86.9 kg)  03/17/23 191 lb (86.6 kg)     GEN:  Well nourished, well developed in no acute distress HEENT: Normal NECK: No JVD; No carotid bruits LYMPHATICS: No lymphadenopathy CARDIAC: RRR, no murmurs, rubs, gallops RESPIRATORY:  Clear to auscultation without rales, wheezing or rhonchi  ABDOMEN: Soft, non-tender, non-distended MUSCULOSKELETAL:  No edema; No deformity  SKIN: Warm and dry NEUROLOGIC:  Alert and oriented x 3 PSYCHIATRIC:  Normal affect    Risk Assessment/Risk Calculators:               ASSESSMENT & PLAN:    Coarctation of the aorta history of repair Postpartum hypertension Dyspnea on exertion  Her blood pressure in the office today is within target we will not adjust her antihypertensive medication.  Agree with the aspirin use for preeclampsia prophylaxis.  I asked the patient to take her blood pressure daily she had her blood pressure be increased 140/90 mmHg to notify my office via my chart.  Her echo is still pending.  She is WHO classification at maternal cardiovascular risk: Class II  I plan to follow the patient closely during her pregnancy in the cardio OB clinic.  She will be discharged postpartum back to her primary cardiologist:  Dr. Janne Napoleon.  Patient Instructions  Medication Instructions:  Your physician recommends that you continue on your current medications as directed. Please refer to the Current Medication list given to you today.  *If you need a refill on your cardiac  medications before your next appointment, please call your pharmacy*   Lab Work: None   Testing/Procedures: None   Follow-Up: At [facility], you and your health needs are our priority.  As part of our continuing mission to provide you with exceptional heart care, we have created designated Provider Care Teams.  These Care Teams include your primary Cardiologist (physician) and Advanced Practice Providers (APPs -  Physician Assistants and Nurse Practitioners) who all work together to provide you with the care you need, when you need it.   Your next appointment:   November 13th at 8:40 am   Provider:   Thomasene Ripple, DO     Dispo:  No follow-ups on file.   Medication Adjustments/Labs and Tests Ordered: Current medicines are reviewed at length with the patient today.  Concerns regarding medicines are outlined above.  Tests Ordered: No orders of the defined types were placed in this encounter.  Medication Changes: No orders of the defined types were placed in this encounter.

## 2023-03-30 NOTE — Patient Instructions (Signed)
Medication Instructions:  Your physician recommends that you continue on your current medications as directed. Please refer to the Current Medication list given to you today.  *If you need a refill on your cardiac medications before your next appointment, please call your pharmacy*   Lab Work: None   Testing/Procedures: None   Follow-Up: At [facility], you and your health needs are our priority.  As part of our continuing mission to provide you with exceptional heart care, we have created designated Provider Care Teams.  These Care Teams include your primary Cardiologist (physician) and Advanced Practice Providers (APPs -  Physician Assistants and Nurse Practitioners) who all work together to provide you with the care you need, when you need it.   Your next appointment:   November 13th at 8:40 am   Provider:   Thomasene Ripple, DO

## 2023-04-07 ENCOUNTER — Encounter: Payer: Self-pay | Admitting: Cardiology

## 2023-04-07 ENCOUNTER — Other Ambulatory Visit: Payer: Self-pay

## 2023-04-07 ENCOUNTER — Ambulatory Visit (INDEPENDENT_AMBULATORY_CARE_PROVIDER_SITE_OTHER): Payer: BC Managed Care – PPO | Admitting: Certified Nurse Midwife

## 2023-04-07 VITALS — BP 116/76 | HR 105 | Wt 192.6 lb

## 2023-04-07 DIAGNOSIS — Z3A21 21 weeks gestation of pregnancy: Secondary | ICD-10-CM

## 2023-04-07 DIAGNOSIS — O10919 Unspecified pre-existing hypertension complicating pregnancy, unspecified trimester: Secondary | ICD-10-CM

## 2023-04-07 DIAGNOSIS — O0990 Supervision of high risk pregnancy, unspecified, unspecified trimester: Secondary | ICD-10-CM

## 2023-04-07 DIAGNOSIS — Q251 Coarctation of aorta: Secondary | ICD-10-CM

## 2023-04-07 NOTE — Progress Notes (Unsigned)
   PRENATAL VISIT NOTE  Subjective:  Cassandra Hoover is a 37 y.o. G2P1001 at [redacted]w[redacted]d being seen today for ongoing prenatal care.  She is currently monitored for the following issues for this {Blank single:19197::"high-risk","low-risk"} pregnancy and has Aortic valve disorder; Coarctation of aorta, congenital; Chronic hypertension in pregnancy; Supervision of high risk pregnancy, antepartum; Advanced maternal age in multigravida; Generalized anxiety disorder; and Irritable bowel syndrome on their problem list.  Patient reports {sx:[id number]}.   .  .   . Denies leaking of fluid.   The following portions of the patient's history were reviewed and updated as appropriate: allergies, current medications, past family history, past medical history, past social history, past surgical history and problem list.   Objective:  There were no vitals filed for this visit.  Fetal Status:           General:  Alert, oriented and cooperative. Patient is in no acute distress.  Skin: Skin is warm and dry. No rash noted.   Cardiovascular: Normal heart rate noted  Respiratory: Normal respiratory effort, no problems with respiration noted  Abdomen: Soft, gravid, appropriate for gestational age.        Pelvic: {Blank single:19197::"Cervical exam performed in the presence of a chaperone","Cervical exam deferred"}        Extremities: Normal range of motion.     Mental Status: Normal mood and affect. Normal behavior. Normal judgment and thought content.   Assessment and Plan:  Pregnancy: G2P1001 at [redacted]w[redacted]d 1. Supervision of high risk pregnancy, antepartum ***  2. [redacted] weeks gestation of pregnancy ***  3. Chronic hypertension in pregnancy ***  4. Coarctation of aorta, congenital ***  {Blank single:19197::"Term","Preterm"} labor symptoms and general obstetric precautions including but not limited to vaginal bleeding, contractions, leaking of fluid and fetal movement were reviewed in detail with the patient. Please refer  to After Visit Summary for other counseling recommendations.   No follow-ups on file.  Future Appointments  Date Time Provider Department Center  04/07/2023  2:35 PM Osborne Oman [facility]  04/16/2023  9:30 AM [facility] NURSE [facility]  04/16/2023  9:45 AM [facility] US4 [facility]  04/20/2023 11:35 AM [facility]-CV CH ECHO 5 [facility]SITE3ECHO [facility]  05/05/2023  8:50 AM [facility] LAB [facility]  05/05/2023  9:35 AM Osborne Oman [facility]  05/20/2023 10:15 AM [facility] NURSE [facility]  05/20/2023 10:30 AM [facility] US3 [facility]  06/23/2023  8:40 AM Tobb, Kardie, DO [facility] None  07/23/2023  8:20 AM Jodelle Red, MD [facility]    Bernerd Limbo, CNM

## 2023-04-16 ENCOUNTER — Ambulatory Visit: Payer: BC Managed Care – PPO | Admitting: *Deleted

## 2023-04-16 ENCOUNTER — Ambulatory Visit: Payer: BC Managed Care – PPO | Attending: Maternal & Fetal Medicine

## 2023-04-16 VITALS — BP 114/63 | HR 86

## 2023-04-16 DIAGNOSIS — O43192 Other malformation of placenta, second trimester: Secondary | ICD-10-CM | POA: Diagnosis not present

## 2023-04-16 DIAGNOSIS — O09529 Supervision of elderly multigravida, unspecified trimester: Secondary | ICD-10-CM | POA: Diagnosis present

## 2023-04-16 DIAGNOSIS — O10912 Unspecified pre-existing hypertension complicating pregnancy, second trimester: Secondary | ICD-10-CM | POA: Insufficient documentation

## 2023-04-16 DIAGNOSIS — O0990 Supervision of high risk pregnancy, unspecified, unspecified trimester: Secondary | ICD-10-CM

## 2023-04-16 DIAGNOSIS — O10012 Pre-existing essential hypertension complicating pregnancy, second trimester: Secondary | ICD-10-CM

## 2023-04-16 DIAGNOSIS — O09522 Supervision of elderly multigravida, second trimester: Secondary | ICD-10-CM

## 2023-04-16 DIAGNOSIS — O352XX0 Maternal care for (suspected) hereditary disease in fetus, not applicable or unspecified: Secondary | ICD-10-CM | POA: Diagnosis not present

## 2023-04-16 DIAGNOSIS — Z362 Encounter for other antenatal screening follow-up: Secondary | ICD-10-CM | POA: Diagnosis present

## 2023-04-16 DIAGNOSIS — Z3A23 23 weeks gestation of pregnancy: Secondary | ICD-10-CM

## 2023-04-20 ENCOUNTER — Encounter: Payer: Self-pay | Admitting: Certified Nurse Midwife

## 2023-04-20 ENCOUNTER — Ambulatory Visit (HOSPITAL_COMMUNITY): Payer: BC Managed Care – PPO

## 2023-04-20 NOTE — Telephone Encounter (Signed)
Called pt in response to initial MyChart message. She reports having nose bleeds that began 3 days ago. Has tried saline nasal spray, sitting in bathroom with shower steam for prevention. Encouraged pt to continue these methods for increasing moisture in nose. When the bleeding occurs she uses pressure on her nose and tilts her head forward. She sent a message today because the bleeding lasted for an hour and this was different than the past few days. Reviewed with Crissie Reese MD who states Afrin is safe to use. Pt plans to trial ice pack as tolerated on her nose and if no decrease in bleeding after 15 minutes will purchase Afrin from nearest pharmacy. If bleeding has not resolved in 1 hour she will present to [facility]U for evaluation.

## 2023-04-30 ENCOUNTER — Encounter: Payer: Self-pay | Admitting: Certified Nurse Midwife

## 2023-05-04 ENCOUNTER — Ambulatory Visit (HOSPITAL_COMMUNITY): Payer: BC Managed Care – PPO | Attending: Cardiology

## 2023-05-04 ENCOUNTER — Other Ambulatory Visit: Payer: Self-pay

## 2023-05-04 DIAGNOSIS — Q251 Coarctation of aorta: Secondary | ICD-10-CM | POA: Insufficient documentation

## 2023-05-04 DIAGNOSIS — O0990 Supervision of high risk pregnancy, unspecified, unspecified trimester: Secondary | ICD-10-CM

## 2023-05-04 LAB — ECHOCARDIOGRAM COMPLETE
AR max vel: 1.11 cm2
AV Area VTI: 1.48 cm2
AV Area mean vel: 1.01 cm2
AV Mean grad: 15.7 mmHg
AV Peak grad: 25.3 mmHg
Ao pk vel: 2.51 m/s
Area-P 1/2: 3.95 cm2
S' Lateral: 3.5 cm

## 2023-05-05 ENCOUNTER — Ambulatory Visit (INDEPENDENT_AMBULATORY_CARE_PROVIDER_SITE_OTHER): Payer: BC Managed Care – PPO | Admitting: Certified Nurse Midwife

## 2023-05-05 ENCOUNTER — Other Ambulatory Visit: Payer: BC Managed Care – PPO

## 2023-05-05 ENCOUNTER — Other Ambulatory Visit: Payer: Self-pay

## 2023-05-05 VITALS — BP 119/75 | HR 97 | Wt 195.1 lb

## 2023-05-05 DIAGNOSIS — R040 Epistaxis: Secondary | ICD-10-CM

## 2023-05-05 DIAGNOSIS — O10912 Unspecified pre-existing hypertension complicating pregnancy, second trimester: Secondary | ICD-10-CM

## 2023-05-05 DIAGNOSIS — Z3A25 25 weeks gestation of pregnancy: Secondary | ICD-10-CM

## 2023-05-05 DIAGNOSIS — O09522 Supervision of elderly multigravida, second trimester: Secondary | ICD-10-CM

## 2023-05-05 DIAGNOSIS — F411 Generalized anxiety disorder: Secondary | ICD-10-CM

## 2023-05-05 DIAGNOSIS — O10919 Unspecified pre-existing hypertension complicating pregnancy, unspecified trimester: Secondary | ICD-10-CM

## 2023-05-05 DIAGNOSIS — O0992 Supervision of high risk pregnancy, unspecified, second trimester: Secondary | ICD-10-CM

## 2023-05-05 DIAGNOSIS — O0990 Supervision of high risk pregnancy, unspecified, unspecified trimester: Secondary | ICD-10-CM

## 2023-05-05 NOTE — Progress Notes (Signed)
   PRENATAL VISIT NOTE  Subjective:  Cassandra Hoover is a 37 y.o. G2P1001 at [redacted]w[redacted]d being seen today for ongoing prenatal care.  She is currently monitored for the following issues for this high-risk pregnancy and has Aortic valve disorder; Coarctation of aorta, congenital; Chronic hypertension in pregnancy; Supervision of high risk pregnancy, antepartum; Advanced maternal age in multigravida; Generalized anxiety disorder; and Irritable bowel syndrome on their problem list.  Patient reports no complaints.  Contractions: Not present. Vag. Bleeding: None.  Movement: Present. Denies leaking of fluid.   The following portions of the patient's history were reviewed and updated as appropriate: allergies, current medications, past family history, past medical history, past social history, past surgical history and problem list.   Objective:   Vitals:   05/05/23 0913  BP: 119/75  Pulse: 97  Weight: 195 lb 1.6 oz (88.5 kg)   Fetal Status: Fetal Heart Rate (bpm): 140 Fundal Height: 26 cm Movement: Present     General:  Alert, oriented and cooperative. Patient is in no acute distress.  Skin: Skin is warm and dry. No rash noted.   Cardiovascular: Normal heart rate noted  Respiratory: Normal respiratory effort, no problems with respiration noted  Abdomen: Soft, gravid, appropriate for gestational age.  Pain/Pressure: Absent     Pelvic: Cervical exam deferred        Extremities: Normal range of motion.  Edema: None  Mental Status: Normal mood and affect. Normal behavior. Normal judgment and thought content.   Assessment and Plan:  Pregnancy: G2P1001 at [redacted]w[redacted]d 1. Supervision of high risk pregnancy in second trimester - Doing well, feeling regular and vigorous fetal movement   2. [redacted] weeks gestation of pregnancy - Routine OB care including GTT today  3. Chronic hypertension in pregnancy - Stable on labetalol  4. Generalized anxiety disorder - Stable on zoloft  5. Multigravida of advanced  maternal age in second trimester - Taking daily aspirin  6. Frequent nosebleeds - Was having frequent nosebleeds but not since starting Afrin  Preterm labor symptoms and general obstetric precautions including but not limited to vaginal bleeding, contractions, leaking of fluid and fetal movement were reviewed in detail with the patient. Please refer to After Visit Summary for other counseling recommendations.   Return in about 2 weeks (around 05/19/2023) for IN-PERSON, HOB.  Future Appointments  Date Time Provider Department Center  05/19/2023 10:55 AM Osborne Oman [facility]  05/20/2023 10:15 AM [facility] NURSE [facility]  05/20/2023 10:30 AM [facility] US3 [facility]  06/02/2023 10:15 AM Osborne Oman [facility]  06/23/2023  8:40 AM Thomasene Ripple, DO [facility] None  07/23/2023  8:20 AM Jodelle Red, MD [facility]   Bernerd Limbo, CNM

## 2023-05-06 LAB — CBC
Hematocrit: 32.6 % — ABNORMAL LOW (ref 34.0–46.6)
Hemoglobin: 10.7 g/dL — ABNORMAL LOW (ref 11.1–15.9)
MCH: 30.9 pg (ref 26.6–33.0)
MCHC: 32.8 g/dL (ref 31.5–35.7)
MCV: 94 fL (ref 79–97)
Platelets: 255 10*3/uL (ref 150–450)
RBC: 3.46 x10E6/uL — ABNORMAL LOW (ref 3.77–5.28)
RDW: 12.3 % (ref 11.7–15.4)
WBC: 8.2 10*3/uL (ref 3.4–10.8)

## 2023-05-06 LAB — GLUCOSE TOLERANCE, 2 HOURS W/ 1HR
Glucose, 1 hour: 113 mg/dL (ref 70–179)
Glucose, 2 hour: 99 mg/dL (ref 70–152)
Glucose, Fasting: 76 mg/dL (ref 70–91)

## 2023-05-06 LAB — HIV ANTIBODY (ROUTINE TESTING W REFLEX): HIV Screen 4th Generation wRfx: NONREACTIVE

## 2023-05-06 LAB — RPR: RPR Ser Ql: NONREACTIVE

## 2023-05-06 LAB — ANTIBODY SCREEN: Antibody Screen: NEGATIVE

## 2023-05-18 NOTE — Progress Notes (Unsigned)
   PRENATAL VISIT NOTE  Subjective:  Cassandra Hoover is a 37 y.o. G2P1001 at [redacted]w[redacted]d being seen today for ongoing prenatal care.  She is currently monitored for the following issues for this high-risk pregnancy and has Aortic valve disorder; Coarctation of aorta, congenital; Rh negative state in antepartum period; Chronic hypertension in pregnancy; Supervision of high-risk pregnancy; Advanced maternal age in multigravida; Generalized anxiety disorder; and Irritable bowel syndrome on their problem list.  Patient reports  looser stools, not quite diarrhea but otherwise doing well. No other symptoms reported .  Contractions: Not present. Vag. Bleeding: None.  Movement: Present. Denies leaking of fluid.   The following portions of the patient's history were reviewed and updated as appropriate: allergies, current medications, past family history, past medical history, past social history, past surgical history and problem list.   Objective:   Vitals:   05/19/23 1116  BP: 115/74  Pulse: 93  Weight: 194 lb (88 kg)   Fetal Status: Fetal Heart Rate (bpm): 140 Fundal Height: 28 cm Movement: Present     General:  Alert, oriented and cooperative. Patient is in no acute distress.  Skin: Skin is warm and dry. No rash noted.   Cardiovascular: Normal heart rate noted  Respiratory: Normal respiratory effort, no problems with respiration noted  Abdomen: Soft, gravid, appropriate for gestational age.  Pain/Pressure: Absent     Pelvic: Cervical exam deferred        Extremities: Normal range of motion.  Edema: None  Mental Status: Normal mood and affect. Normal behavior. Normal judgment and thought content.   Assessment and Plan:  Pregnancy: G2P1001 at [redacted]w[redacted]d 1. Supervision of high risk pregnancy in second trimester - Doing well, feeling regular and vigorous fetal movement   2. [redacted] weeks gestation of pregnancy - Routine OB care  - Reviewed medications/supplements. Has increased leafy greens to boost iron but  is also taking a total of 9 fiber pills daily. Advised to cut fiber supplement down by at least half.   3. Chronic hypertension in pregnancy - Stable on 100mg  labetalol daily  4. Generalized anxiety disorder - Stable on sertraline  5. Multigravida of advanced maternal age in second trimester - Taking daily aspirin  Preterm labor symptoms and general obstetric precautions including but not limited to vaginal bleeding, contractions, leaking of fluid and fetal movement were reviewed in detail with the patient. Please refer to After Visit Summary for other counseling recommendations.   Return in about 2 weeks (around 06/02/2023) for IN-PERSON, HOB.  Future Appointments  Date Time Provider Department Center  05/20/2023 10:15 AM [facility] NURSE [facility]  05/20/2023 10:30 AM [facility] US3 [facility]  06/02/2023 10:15 AM Bernerd Limbo, CNM [facility]  06/23/2023  8:40 AM Thomasene Ripple, DO [facility] None  06/23/2023 10:15 AM Bernerd Limbo, CNM [facility]  07/07/2023 10:15 AM Bernerd Limbo, CNM [facility]  07/23/2023  8:20 AM Jodelle Red, MD [facility]    Bernerd Limbo, CNM

## 2023-05-19 ENCOUNTER — Ambulatory Visit (INDEPENDENT_AMBULATORY_CARE_PROVIDER_SITE_OTHER): Payer: BC Managed Care – PPO | Admitting: Certified Nurse Midwife

## 2023-05-19 ENCOUNTER — Other Ambulatory Visit: Payer: Self-pay

## 2023-05-19 VITALS — BP 115/74 | HR 93 | Wt 194.0 lb

## 2023-05-19 DIAGNOSIS — F411 Generalized anxiety disorder: Secondary | ICD-10-CM

## 2023-05-19 DIAGNOSIS — Z23 Encounter for immunization: Secondary | ICD-10-CM | POA: Diagnosis not present

## 2023-05-19 DIAGNOSIS — Z6791 Unspecified blood type, Rh negative: Secondary | ICD-10-CM

## 2023-05-19 DIAGNOSIS — O26892 Other specified pregnancy related conditions, second trimester: Secondary | ICD-10-CM | POA: Diagnosis not present

## 2023-05-19 DIAGNOSIS — O09522 Supervision of elderly multigravida, second trimester: Secondary | ICD-10-CM

## 2023-05-19 DIAGNOSIS — O0992 Supervision of high risk pregnancy, unspecified, second trimester: Secondary | ICD-10-CM

## 2023-05-19 DIAGNOSIS — Z3A27 27 weeks gestation of pregnancy: Secondary | ICD-10-CM | POA: Diagnosis not present

## 2023-05-19 DIAGNOSIS — O10919 Unspecified pre-existing hypertension complicating pregnancy, unspecified trimester: Secondary | ICD-10-CM

## 2023-05-19 DIAGNOSIS — O10912 Unspecified pre-existing hypertension complicating pregnancy, second trimester: Secondary | ICD-10-CM

## 2023-05-19 DIAGNOSIS — O26899 Other specified pregnancy related conditions, unspecified trimester: Secondary | ICD-10-CM

## 2023-05-19 MED ORDER — RHO D IMMUNE GLOBULIN 1500 UNIT/2ML IJ SOSY
300.0000 ug | PREFILLED_SYRINGE | Freq: Once | INTRAMUSCULAR | Status: AC
  Administered 2023-05-19: 300 ug via INTRAMUSCULAR

## 2023-05-20 ENCOUNTER — Ambulatory Visit: Payer: BC Managed Care – PPO | Admitting: *Deleted

## 2023-05-20 ENCOUNTER — Ambulatory Visit: Payer: BC Managed Care – PPO | Attending: Maternal & Fetal Medicine

## 2023-05-20 ENCOUNTER — Other Ambulatory Visit: Payer: Self-pay | Admitting: *Deleted

## 2023-05-20 VITALS — BP 129/68 | HR 93

## 2023-05-20 DIAGNOSIS — O09522 Supervision of elderly multigravida, second trimester: Secondary | ICD-10-CM | POA: Diagnosis present

## 2023-05-20 DIAGNOSIS — O10912 Unspecified pre-existing hypertension complicating pregnancy, second trimester: Secondary | ICD-10-CM | POA: Diagnosis present

## 2023-05-20 DIAGNOSIS — Z362 Encounter for other antenatal screening follow-up: Secondary | ICD-10-CM | POA: Insufficient documentation

## 2023-05-20 DIAGNOSIS — Z3A28 28 weeks gestation of pregnancy: Secondary | ICD-10-CM

## 2023-05-20 DIAGNOSIS — O09523 Supervision of elderly multigravida, third trimester: Secondary | ICD-10-CM | POA: Diagnosis not present

## 2023-05-20 DIAGNOSIS — O0992 Supervision of high risk pregnancy, unspecified, second trimester: Secondary | ICD-10-CM | POA: Diagnosis present

## 2023-05-20 DIAGNOSIS — O43193 Other malformation of placenta, third trimester: Secondary | ICD-10-CM | POA: Diagnosis not present

## 2023-05-20 DIAGNOSIS — O10013 Pre-existing essential hypertension complicating pregnancy, third trimester: Secondary | ICD-10-CM

## 2023-05-20 DIAGNOSIS — Z87798 Personal history of other (corrected) congenital malformations: Secondary | ICD-10-CM | POA: Diagnosis not present

## 2023-05-20 DIAGNOSIS — O10913 Unspecified pre-existing hypertension complicating pregnancy, third trimester: Secondary | ICD-10-CM

## 2023-05-25 ENCOUNTER — Encounter: Payer: Self-pay | Admitting: Certified Nurse Midwife

## 2023-06-02 ENCOUNTER — Ambulatory Visit (INDEPENDENT_AMBULATORY_CARE_PROVIDER_SITE_OTHER): Payer: BC Managed Care – PPO | Admitting: Certified Nurse Midwife

## 2023-06-02 ENCOUNTER — Other Ambulatory Visit: Payer: Self-pay

## 2023-06-02 VITALS — BP 110/74 | HR 98 | Wt 191.0 lb

## 2023-06-02 DIAGNOSIS — O10919 Unspecified pre-existing hypertension complicating pregnancy, unspecified trimester: Secondary | ICD-10-CM

## 2023-06-02 DIAGNOSIS — Z3480 Encounter for supervision of other normal pregnancy, unspecified trimester: Secondary | ICD-10-CM

## 2023-06-02 DIAGNOSIS — Z3A29 29 weeks gestation of pregnancy: Secondary | ICD-10-CM

## 2023-06-02 DIAGNOSIS — O10913 Unspecified pre-existing hypertension complicating pregnancy, third trimester: Secondary | ICD-10-CM

## 2023-06-02 DIAGNOSIS — R040 Epistaxis: Secondary | ICD-10-CM

## 2023-06-02 NOTE — Progress Notes (Signed)
   PRENATAL VISIT NOTE  Subjective:  Cassandra Hoover is a 37 y.o. G2P1001 at [redacted]w[redacted]d being seen today for ongoing prenatal care.  She is currently monitored for the following issues for this high-risk pregnancy and has Aortic valve disorder; Coarctation of aorta, congenital; Rh negative state in antepartum period; Chronic hypertension in pregnancy; Supervision of high-risk pregnancy; Advanced maternal age in multigravida; Generalized anxiety disorder; and Irritable bowel syndrome on their problem list.  Patient reports  frequent nosebleeds .  Contractions: Not present. Vag. Bleeding: None.  Movement: Present. Denies leaking of fluid.   The following portions of the patient's history were reviewed and updated as appropriate: allergies, current medications, past family history, past medical history, past social history, past surgical history and problem list.   Objective:   Vitals:   06/02/23 1050  BP: 110/74  Pulse: 98  Weight: 191 lb (86.6 kg)    Fetal Status: Fetal Heart Rate (bpm): 140 Fundal Height: 29 cm Movement: Present     General:  Alert, oriented and cooperative. Patient is in no acute distress.  Skin: Skin is warm and dry. No rash noted.   Cardiovascular: Normal heart rate noted  Respiratory: Normal respiratory effort, no problems with respiration noted  Abdomen: Soft, gravid, appropriate for gestational age.  Pain/Pressure: Present     Pelvic: Cervical exam deferred        Extremities: Normal range of motion.  Edema: None  Mental Status: Normal mood and affect. Normal behavior. Normal judgment and thought content.   Assessment and Plan:  Pregnancy: G2P1001 at [redacted]w[redacted]d 1. Supervision of other normal pregnancy, antepartum - Doing well overall, feeling regular and vigorous fetal movement   2. [redacted] weeks gestation of pregnancy - Routine OB care   3. Chronic hypertension in pregnancy - Stable on labetalol  4. Frequent nosebleeds - Has tried saline, humidifier and Afrin when  nosebleeds start - Will get CBC to check platelets (255 at last check), but also advised cutting aspirin dose in half (down to 81mg /day) and trying flonase daily to reduce inflammation.  - Will refer to ENT if this does not resolve - CBC  Preterm labor symptoms and general obstetric precautions including but not limited to vaginal bleeding, contractions, leaking of fluid and fetal movement were reviewed in detail with the patient. Please refer to After Visit Summary for other counseling recommendations.   Return in about 2 weeks (around 06/16/2023) for IN-PERSON, HOB.  Future Appointments  Date Time Provider Department Center  06/17/2023 12:15 PM [facility] NURSE [facility]  06/17/2023 12:30 PM [facility] US3 [facility]  06/23/2023  8:40 AM Tobb, Kardie, DO [facility] None  06/23/2023 10:15 AM Bernerd Limbo, CNM [facility]  06/24/2023 10:15 AM [facility] NURSE [facility]  06/24/2023 10:30 AM [facility] US2 [facility]  07/01/2023 10:15 AM [facility] NURSE [facility]  07/01/2023 10:30 AM [facility] US2 [facility]  07/07/2023  9:15 AM [facility] NURSE [facility]  07/07/2023  9:30 AM [facility] US3 [facility]  07/07/2023 10:15 AM Osborne Oman [facility]  07/23/2023  8:20 AM Jodelle Red, MD [facility]    Bernerd Limbo, CNM

## 2023-06-02 NOTE — Progress Notes (Deleted)
   PRENATAL VISIT NOTE  Subjective:  Cassandra Hoover is a 37 y.o. G2P1001 at [redacted]w[redacted]d being seen today for ongoing prenatal care.  She is currently monitored for the following issues for this {Blank single:19197::"high-risk","low-risk"} pregnancy and has Aortic valve disorder; Coarctation of aorta, congenital; Rh negative state in antepartum period; Chronic hypertension in pregnancy; Supervision of high-risk pregnancy; Advanced maternal age in multigravida; Generalized anxiety disorder; and Irritable bowel syndrome on their problem list.  Patient reports {sx:14538}.  Contractions: Not present. Vag. Bleeding: None.  Movement: Present. Denies leaking of fluid.   The following portions of the patient's history were reviewed and updated as appropriate: allergies, current medications, past family history, past medical history, past social history, past surgical history and problem list.   Objective:   Vitals:   06/02/23 1050  BP: 110/74  Pulse: 98  Weight: 191 lb (86.6 kg)    Fetal Status: Fetal Heart Rate (bpm): 140   Movement: Present     General:  Alert, oriented and cooperative. Patient is in no acute distress.  Skin: Skin is warm and dry. No rash noted.   Cardiovascular: Normal heart rate noted  Respiratory: Normal respiratory effort, no problems with respiration noted  Abdomen: Soft, gravid, appropriate for gestational age.  Pain/Pressure: Present     Pelvic: {Blank single:19197::"Cervical exam performed in the presence of a chaperone","Cervical exam deferred"}        Extremities: Normal range of motion.     Mental Status: Normal mood and affect. Normal behavior. Normal judgment and thought content.   Assessment and Plan:  Pregnancy: G2P1001 at 107w6d There are no diagnoses linked to this encounter. {Blank single:19197::"Term","Preterm"} labor symptoms and general obstetric precautions including but not limited to vaginal bleeding, contractions, leaking of fluid and fetal movement were  reviewed in detail with the patient. Please refer to After Visit Summary for other counseling recommendations.   No follow-ups on file.  Future Appointments  Date Time Provider Department Center  06/17/2023 12:15 PM [facility] NURSE [facility]  06/17/2023 12:30 PM [facility] US3 [facility]  06/23/2023  8:40 AM Tobb, Kardie, DO [facility] None  06/23/2023 10:15 AM Bernerd Limbo, CNM [facility]  06/24/2023 10:15 AM [facility] NURSE [facility]  06/24/2023 10:30 AM [facility] US2 [facility]  07/01/2023 10:15 AM [facility] NURSE [facility]  07/01/2023 10:30 AM [facility] US2 [facility]  07/07/2023  9:15 AM [facility] NURSE [facility]  07/07/2023  9:30 AM [facility] US3 [facility]  07/07/2023 10:15 AM Osborne Oman [facility]  07/23/2023  8:20 AM Jodelle Red, MD [facility]    Richardson Landry, CNM

## 2023-06-03 LAB — CBC
Hematocrit: 32.9 % — ABNORMAL LOW (ref 34.0–46.6)
Hemoglobin: 11 g/dL — ABNORMAL LOW (ref 11.1–15.9)
MCH: 30.3 pg (ref 26.6–33.0)
MCHC: 33.4 g/dL (ref 31.5–35.7)
MCV: 91 fL (ref 79–97)
Platelets: 250 10*3/uL (ref 150–450)
RBC: 3.63 x10E6/uL — ABNORMAL LOW (ref 3.77–5.28)
RDW: 11.8 % (ref 11.7–15.4)
WBC: 7.7 10*3/uL (ref 3.4–10.8)

## 2023-06-07 ENCOUNTER — Other Ambulatory Visit: Payer: Self-pay

## 2023-06-07 ENCOUNTER — Encounter: Payer: Self-pay | Admitting: Certified Nurse Midwife

## 2023-06-07 ENCOUNTER — Inpatient Hospital Stay (HOSPITAL_COMMUNITY)
Admission: AD | Admit: 2023-06-07 | Discharge: 2023-06-07 | Disposition: A | Discharge status: Home / Self Care | Payer: BC Managed Care – PPO | Attending: Obstetrics and Gynecology | Admitting: Obstetrics and Gynecology

## 2023-06-07 DIAGNOSIS — Z79899 Other long term (current) drug therapy: Secondary | ICD-10-CM | POA: Diagnosis not present

## 2023-06-07 DIAGNOSIS — O99513 Diseases of the respiratory system complicating pregnancy, third trimester: Secondary | ICD-10-CM | POA: Insufficient documentation

## 2023-06-07 DIAGNOSIS — J3089 Other allergic rhinitis: Secondary | ICD-10-CM | POA: Diagnosis not present

## 2023-06-07 DIAGNOSIS — R040 Epistaxis: Secondary | ICD-10-CM | POA: Diagnosis present

## 2023-06-07 DIAGNOSIS — O09523 Supervision of elderly multigravida, third trimester: Secondary | ICD-10-CM | POA: Insufficient documentation

## 2023-06-07 DIAGNOSIS — Z3A30 30 weeks gestation of pregnancy: Secondary | ICD-10-CM | POA: Diagnosis not present

## 2023-06-07 DIAGNOSIS — O0992 Supervision of high risk pregnancy, unspecified, second trimester: Secondary | ICD-10-CM

## 2023-06-07 LAB — CBC
HCT: 34.1 % — ABNORMAL LOW (ref 36.0–46.0)
Hemoglobin: 11.1 g/dL — ABNORMAL LOW (ref 12.0–15.0)
MCH: 29.5 pg (ref 26.0–34.0)
MCHC: 32.6 g/dL (ref 30.0–36.0)
MCV: 90.7 fL (ref 80.0–100.0)
Platelets: 246 10*3/uL (ref 150–400)
RBC: 3.76 MIL/uL — ABNORMAL LOW (ref 3.87–5.11)
RDW: 12.6 % (ref 11.5–15.5)
WBC: 8.2 10*3/uL (ref 4.0–10.5)
nRBC: 0 % (ref 0.0–0.2)

## 2023-06-07 LAB — CREATININE, SERUM
Creatinine, Ser: 0.59 mg/dL (ref 0.44–1.00)
GFR, Estimated: >60 mL/min (ref >60)

## 2023-06-07 NOTE — Discharge Instructions (Signed)

## 2023-06-07 NOTE — MAU Note (Signed)
Cassandra Hoover is a 37 y.o. at [redacted]w[redacted]d here in MAU reporting: she's had a nose bleed intermittently since 1250 this afternoon.  Reports she's applied ice and plugged nose and bleeding would stop but eventually return.  Nose not currently bleeding.   Denies pregnancy c/o, no VB or LOF.  Endorses +FM. LMP: NA Onset of complaint: today Pain score: 6 Vitals:   06/07/23 1815  BP: 122/76  Pulse: 85  Resp: 18  Temp: 97.7 F (36.5 C)  SpO2: 97%     FHT:140 bpm Lab orders placed from triage:   None

## 2023-06-07 NOTE — Telephone Encounter (Signed)
Called pt to review MyChart message regarding nosebleed. Pt reports this began at 1245 today. She has tried all usual methods of stopping the bleeding (afrin, pressure, ice, packing with tissue). Bleeding will stop intermittently and she will notice a clot is forming but then it will dislodge. Pt reports she spoke with after hours triage who recommended MAU. Encouraged pt to go to MAU for evaluation of continued bleeding. Message sent to MAU providers via secure chat.

## 2023-06-07 NOTE — MAU Provider Note (Signed)
Nose bleeding   S [name] is a 37 y.o. G2P1001 pregnant female at [redacted]w[redacted]d who presents to MAU today with complaint of nosebleed. Pt states she has been having weekly nosebleeds since 04/2023 that are usually responsive to Afrin, cold backs, nostril pressure, and packing.  However today was at a friend's house for her child's birthday and had nosebleed not responsive to all of the above measures.  She presents to triage and removed tissue from her nose and bleeding finally has stopped.  She endorses three worse nosebleeds since 06/02/23 ironically after starting back on Flonase nasal spray for allergies. She describes proper technique in using the medication, being cautious of the septum.  She also reports humidified air and nasal gels religiously.  Has not seen ENT yet. Reports plts and Hgb stable when checked recently.  Denies CP,SOB, lightheadedness, palpitations, HA, RUQ pain, lower extremity swelling. Pt does endorse low UOP despite adequate PO hydration.   Rece[facility] at [facility]. Prenatal records reviewed.  Pertinent items noted in HPI and remainder of comprehensive ROS otherwise negative.   O BP 122/76 (BP Location: Right Arm)   Pulse 85   Temp 97.7 F (36.5 C) (Oral)   Resp 18   Ht 5\' 7"  (1.702 m)   Wt 88.1 kg   LMP 10/30/2022   SpO2 97%   BMI 30.42 kg/m  Physical Exam Vitals and nursing note reviewed.  Constitutional:      General: She is not in acute distress.    Appearance: Normal appearance. She is normal weight. She is not ill-appearing.  HENT:     Head: Normocephalic and atraumatic.     Nose: No nasal deformity.     Right Turbinates: Not swollen.     Left Turbinates: Not swollen.     Comments: Turbinates with thin appearing, erythematous mucosa, pt's L nostril with small raw area measuring approximately 0.5cm in diameter about 3cm within the nostril and on the septum, no active bleeding noted  Cardiovascular:     Rate and Rhythm: Normal rate.  Pulmonary:      Effort: Pulmonary effort is normal. No respiratory distress.  Abdominal:     General: Abdomen is flat. There is no distension.     Palpations: Abdomen is soft.  Musculoskeletal:        General: No swelling. Normal range of motion.     Cervical back: Normal range of motion.  Skin:    General: Skin is warm and dry.  Neurological:     Mental Status: She is alert and oriented to person, place, and time. Mental status is at baseline.     Motor: No weakness.     Gait: Gait normal.  Psychiatric:        Mood and Affect: Mood normal.        Behavior: Behavior normal.        Thought Content: Thought content normal.        Judgment: Judgment normal.      MDM: Low MAU Course:  Bleeding controlled at time of presentation.  Exam reassuring. Given HPI of significant bleeding since 1245 this afternoon will check CBC to ensure stable Hgb / Plts as well as serum Cr given concern for low UOP. Cbc with stable Hgb 11.1, Plts 246.  Cr 0.59.  Stable for discharge at this time.   A&P: #[redacted] weeks gestation #Recurrent nose bleeds Thought to be in part due to [facility] delivery despite pt's best efforts to avoid septum given lesion on  nasal septum and uptick in occurrence and severity after starting back on that medication - referral to ENT at discharge - recommended to d/c Flonase for now and to try oral antihistamine instead   # Low UOP, resolved Stable creatinine and patient reports adequate light yellow UOP in between evaluations, CTM   Discharge from MAU in stable condition with strict/usual precautions Follow up at [facility] as scheduled for ongoing prenatal care  Allergies as of 06/07/2023       Reactions   Sudafed [pseudoephedrine] Palpitations        Medication List     TAKE these medications    aspirin EC 81 MG tablet Take 1 tablet (81 mg total) by mouth daily. Take after 12 weeks for prevention of preeclampsia later in pregnancy What changed: how much to take   Blood Pressure Monitor  Automat Devi 1 Device by Does not apply route daily. Automatic blood pressure cuff regular size. To monitor blood pressure regularly at home. ICD-10 code: O09.90   CHOLINE PO Take by mouth.   D3-50 PO Take 2,000 Units by mouth daily.   fluticasone 50 MCG/ACT nasal spray Commonly known as: FLONASE Place into both nostrils daily as needed for allergies or rhinitis.   IRON PO Take by mouth.   labetalol 100 MG tablet Commonly known as: NORMODYNE Take 100 mg by mouth 2 (two) times daily.   Loratadine 10 MG Caps Take by mouth.   Mag-Oxide 200 MG Tabs Generic drug: Magnesium Oxide -Mg Supplement Take 2 tablets (400 mg total) by mouth at bedtime. If that amount causes loose stools in the am, switch to 200mg  daily at bedtime. What changed: additional instructions   ONE A DAY PRENATAL PO Take 1 tablet by mouth daily.   PSYLLIUM HUSK PO Take by mouth.   sertraline 25 MG tablet Commonly known as: ZOLOFT Take 3 tablets (75 mg total) by mouth daily.        Hessie Dibble, MD 06/07/2023 7:33 PM

## 2023-06-16 ENCOUNTER — Other Ambulatory Visit: Payer: Self-pay

## 2023-06-16 ENCOUNTER — Ambulatory Visit: Payer: BC Managed Care – PPO | Attending: Obstetrics

## 2023-06-16 ENCOUNTER — Ambulatory Visit: Payer: BC Managed Care – PPO | Admitting: *Deleted

## 2023-06-16 VITALS — BP 117/70 | HR 96

## 2023-06-16 DIAGNOSIS — O09523 Supervision of elderly multigravida, third trimester: Secondary | ICD-10-CM

## 2023-06-16 DIAGNOSIS — O43193 Other malformation of placenta, third trimester: Secondary | ICD-10-CM | POA: Diagnosis not present

## 2023-06-16 DIAGNOSIS — O352XX0 Maternal care for (suspected) hereditary disease in fetus, not applicable or unspecified: Secondary | ICD-10-CM | POA: Diagnosis not present

## 2023-06-16 DIAGNOSIS — O10013 Pre-existing essential hypertension complicating pregnancy, third trimester: Secondary | ICD-10-CM

## 2023-06-16 DIAGNOSIS — O10913 Unspecified pre-existing hypertension complicating pregnancy, third trimester: Secondary | ICD-10-CM | POA: Diagnosis present

## 2023-06-16 DIAGNOSIS — O0993 Supervision of high risk pregnancy, unspecified, third trimester: Secondary | ICD-10-CM | POA: Insufficient documentation

## 2023-06-16 DIAGNOSIS — Z3A31 31 weeks gestation of pregnancy: Secondary | ICD-10-CM

## 2023-06-17 ENCOUNTER — Ambulatory Visit: Payer: BC Managed Care – PPO

## 2023-06-22 NOTE — Progress Notes (Unsigned)
   PRENATAL VISIT NOTE  Subjective:  Cassandra Hoover is a 37 y.o. G2P1001 at [redacted]w[redacted]d being seen today for ongoing prenatal care.  She is currently monitored for the following issues for this {Blank single:19197::"high-risk","low-risk"} pregnancy and has Aortic valve disorder; Coarctation of aorta, congenital; Rh negative state in antepartum period; Chronic hypertension in pregnancy; Supervision of high-risk pregnancy; Advanced maternal age in multigravida; Generalized anxiety disorder; and Irritable bowel syndrome on their problem list.  Patient reports {sx:14538}.   .  .   . Denies leaking of fluid.   The following portions of the patient's history were reviewed and updated as appropriate: allergies, current medications, past family history, past medical history, past social history, past surgical history and problem list.   Objective:  There were no vitals filed for this visit.  Fetal Status:           General:  Alert, oriented and cooperative. Patient is in no acute distress.  Skin: Skin is warm and dry. No rash noted.   Cardiovascular: Normal heart rate noted  Respiratory: Normal respiratory effort, no problems with respiration noted  Abdomen: Soft, gravid, appropriate for gestational age.        Pelvic: {Blank single:19197::"Cervical exam performed in the presence of a chaperone","Cervical exam deferred"}        Extremities: Normal range of motion.     Mental Status: Normal mood and affect. Normal behavior. Normal judgment and thought content.   Assessment and Plan:  Pregnancy: G2P1001 at [redacted]w[redacted]d 1. Supervision of high risk pregnancy in third trimester ***  2. [redacted] weeks gestation of pregnancy ***  3. Chronic hypertension in pregnancy ***  4. Multigravida of advanced maternal age in third trimester ***  5. Generalized anxiety disorder ***  6. Frequent nosebleeds ***  {Blank single:19197::"Term","Preterm"} labor symptoms and general obstetric precautions including but not  limited to vaginal bleeding, contractions, leaking of fluid and fetal movement were reviewed in detail with the patient. Please refer to After Visit Summary for other counseling recommendations.   No follow-ups on file.  Future Appointments  Date Time Provider Department Center  06/23/2023  8:40 AM Tobb, Lavona [place], DO [facility] None  06/23/2023 10:15 AM Bernerd Limbo, CNM [facility]  06/24/2023 10:15 AM [facility] NURSE [facility]  06/24/2023 10:30 AM [facility] US2 [facility]  07/01/2023 10:15 AM [facility] NURSE [facility]  07/01/2023 10:30 AM [facility] US2 [facility]  07/07/2023  8:15 AM [facility] US2 [facility]  07/07/2023 10:15 AM Bernerd Limbo, CNM [facility]  07/13/2023 10:30 AM [address] [facility] None  07/23/2023  8:20 AM Jodelle Red, MD [facility]    Bernerd Limbo, CNM

## 2023-06-23 ENCOUNTER — Ambulatory Visit: Payer: BC Managed Care – PPO | Attending: Cardiology | Admitting: Cardiology

## 2023-06-23 ENCOUNTER — Ambulatory Visit (INDEPENDENT_AMBULATORY_CARE_PROVIDER_SITE_OTHER): Payer: BC Managed Care – PPO | Admitting: Certified Nurse Midwife

## 2023-06-23 ENCOUNTER — Other Ambulatory Visit: Payer: Self-pay

## 2023-06-23 VITALS — BP 110/70 | Ht 67.0 in | Wt 192.4 lb

## 2023-06-23 VITALS — BP 120/72 | HR 94 | Wt 193.3 lb

## 2023-06-23 DIAGNOSIS — O10919 Unspecified pre-existing hypertension complicating pregnancy, unspecified trimester: Secondary | ICD-10-CM

## 2023-06-23 DIAGNOSIS — O10913 Unspecified pre-existing hypertension complicating pregnancy, third trimester: Secondary | ICD-10-CM

## 2023-06-23 DIAGNOSIS — Z3A32 32 weeks gestation of pregnancy: Secondary | ICD-10-CM

## 2023-06-23 DIAGNOSIS — O09523 Supervision of elderly multigravida, third trimester: Secondary | ICD-10-CM

## 2023-06-23 DIAGNOSIS — O0993 Supervision of high risk pregnancy, unspecified, third trimester: Secondary | ICD-10-CM

## 2023-06-23 DIAGNOSIS — R040 Epistaxis: Secondary | ICD-10-CM

## 2023-06-23 DIAGNOSIS — F411 Generalized anxiety disorder: Secondary | ICD-10-CM

## 2023-06-23 DIAGNOSIS — Q251 Coarctation of aorta: Secondary | ICD-10-CM | POA: Diagnosis not present

## 2023-06-23 NOTE — Patient Instructions (Signed)
Medication Instructions:  Your physician recommends that you continue on your current medications as directed. Please refer to the Current Medication list given to you today.  *If you need a refill on your cardiac medications before your next appointment, please call your pharmacy*   Lab Work: None   Testing/Procedures: None   Follow-Up: At [facility], you and your health needs are our priority.  As part of our continuing mission to provide you with exceptional heart care, we have created designated Provider Care Teams.  These Care Teams include your primary Cardiologist (physician) and Advanced Practice Providers (APPs -  Physician Assistants and Nurse Practitioners) who all work together to provide you with the care you need, when you need it.  Your next appointment:   4 week(s)  Provider:   Kardie Tobb, DO  

## 2023-06-24 ENCOUNTER — Other Ambulatory Visit: Payer: Self-pay

## 2023-06-24 ENCOUNTER — Ambulatory Visit: Payer: BC Managed Care – PPO | Attending: Obstetrics | Admitting: *Deleted

## 2023-06-24 ENCOUNTER — Ambulatory Visit: Payer: BC Managed Care – PPO

## 2023-06-24 VITALS — BP 115/63 | HR 83

## 2023-06-24 DIAGNOSIS — O09523 Supervision of elderly multigravida, third trimester: Secondary | ICD-10-CM

## 2023-06-24 DIAGNOSIS — Z3A33 33 weeks gestation of pregnancy: Secondary | ICD-10-CM

## 2023-06-24 DIAGNOSIS — Z87798 Personal history of other (corrected) congenital malformations: Secondary | ICD-10-CM | POA: Diagnosis not present

## 2023-06-24 DIAGNOSIS — O10013 Pre-existing essential hypertension complicating pregnancy, third trimester: Secondary | ICD-10-CM | POA: Insufficient documentation

## 2023-06-24 DIAGNOSIS — O10913 Unspecified pre-existing hypertension complicating pregnancy, third trimester: Secondary | ICD-10-CM

## 2023-06-24 DIAGNOSIS — O43193 Other malformation of placenta, third trimester: Secondary | ICD-10-CM | POA: Insufficient documentation

## 2023-06-24 DIAGNOSIS — O0993 Supervision of high risk pregnancy, unspecified, third trimester: Secondary | ICD-10-CM

## 2023-06-24 DIAGNOSIS — O352XX0 Maternal care for (suspected) hereditary disease in fetus, not applicable or unspecified: Secondary | ICD-10-CM

## 2023-06-25 NOTE — Progress Notes (Signed)
Cardio-Obstetrics Clinic  New Evaluation  Date:  06/25/2023   ID:  Cassandra Hoover, DOB 1986-04-15, MRN [id number]  PCP:  Macy Mis, MD   [facility] Providers Cardiologist:  Jodelle Red, MD  Electrophysiologist:  None       Referring MD: Macy Mis, MD   Chief Complaint: " I am doing well"  She is at home. I am in the office   Virtual Visit via Video  Note . I connected with the patient today by a   video enabled telemedicine application and verified that I am speaking with the correct person using two identifiers.  History of Present Illness:    Cassandra Hoover is a 37 y.o. female [G2P1001] with hx of includes coarctation of the aorta follows cardiovascular care in pregnancy.  Hx of hypertensive disorder in pregnancy, coarctation of the aorta.   She was seen virtually for her last visit at that time she was doing well from a CV standpoint,   Recent history of severe nosebleeds. She reports one episode that lasted for five hours, requiring a visit to the hospital. The nosebleeds were accompanied by large clots, which she found concerning. She has been managing the nosebleeds with a humidifier and saline mist at home. She also reports that she was taking a high dose of aspirin, which she has since reduced. The patient denies any other pregnancy-related complaints.  Prior CV Studies Reviewed: The following studies were reviewed today: Cardiac MR 09/2019 CONTRAST:  IRJJOACZY   FINDINGS: Normal LA/RA size. Normal RV size and function No ASD/PFO/VSD. No pericardial effusion normal MV,TV. Aortic valve is tri-leaflet with no significant stenosis or regurgitation. Normal LV size and function. Quantitative EF 59% (EDV 114 cc ESV 47 cc SV 68 cc) Septal thickness normal 8 mm no LVH. Delayed gadolinium images showed no hyper-enhancement of the LV myocardium   Aorta: There is fusiform relative dilatation of the ascending aortic root. The great vessels exit  the arch normally The arch itself is relatively hypoplastic. Just distal to the left subclavian take off there is residual "pseudo coarctation" at the likely sight of previous repair. This area narrows down to a diameter of 13 mm. Flow analysis through this area precisely was not performed   Aortic Sinus: 28 mm   STJ 25 mm   Aortic Root: 34 mm   Aortic Arch 18 mm   Pseudo Coarctation: 13 mm just distal to left subclavian take off   Proximal descending thoracic aorta 24 mm   Distal descending thoracic aorta 17 mm   IMPRESSION: 1. Normal tri- leaflet aortic valve   2. Relative dilatation of the ascending aortic root at 34 mm with hypoplastic arch 17 mm. Residual "pseudo-coarctation" likely at site of previous repair with residual minimal luminal diameter of 13 mm. Flow analysis through the smallest area not performed   3.  Normal LV size and function EF 59%   4.  No delayed LV myocardial enhancement post gadolinium  Past Medical History:  Diagnosis Date   Coarctation of aorta 05/17/2013   Since childhood, Coarct 1.7cmX1.8cm   Coarctation of aorta, congenital    surgical repair at 43 days old and 13 years   Gestational hypertension, third trimester 06/05/2020   Obstetric vaginal laceration with second degree perineal laceration 06/06/2020   Rh negative state in antepartum period 11/27/2019    Past Surgical History:  Procedure Laterality Date   BREAST ENHANCEMENT SURGERY     COARCTATION OF AORTA REPAIR  REFRACTIVE SURGERY     TONSILLECTOMY        OB History     Gravida  2   Para  1   Term  1   Preterm      AB      Living  1      SAB      IAB      Ectopic      Multiple  0   Live Births  1               Current Medications: No outpatient medications have been marked as taking for the 06/23/23 encounter (Office Visit) with Thomasene Ripple, DO.     Allergies:   Sudafed [pseudoephedrine]   Social History   Socioeconomic History    Marital status: Married    Spouse name: Not on file   Number of children: Not on file   Years of education: Not on file   Highest education level: Bachelor's degree (e.g., BA, AB, BS)  Occupational History   Not on file  Tobacco Use   Smoking status: Never   Smokeless tobacco: Never  Vaping Use   Vaping status: Never Used  Substance and Sexual Activity   Alcohol use: Not Currently    Alcohol/week: 3.0 - 4.0 standard drinks of alcohol    Types: 3 - 4 Glasses of wine per week    Comment: socially   Drug use: Never   Sexual activity: Yes  Other Topics Concern   Not on file  Social History Narrative   Not on file   Social Determinants of Health   Financial Resource Strain: Low Risk  (06/25/2022)   Received from [facility], [facility]   Overall Financial Resource Strain (CARDIA)    Difficulty of Paying Living Expenses: Not hard at all  Food Insecurity: No Food Insecurity (06/25/2022)   Received from [facility], [facility]   Hunger Vital Sign    Worried About Running Out of Food in the Last Year: Never true    Ran Out of Food in the Last Year: Never true  Transportation Needs: No Transportation Needs (06/01/2021)   Received from [employer], [facility]   PRAPARE - Transportation    Lack of Transportation (Medical): No    Lack of Transportation (Non-Medical): No  Physical Activity: Insufficiently Active (06/25/2022)   Received from [facility], [facility]   Exercise Vital Sign    Days of Exercise per Week: 2 days    Minutes of Exercise per Session: 40 min  Stress: Stress Concern Present (06/25/2022)   Received from [facility], [facility] - Occupational Stress Questionnaire    Feeling of Stress : To some extent  Social Connections: Socially Integrated (08/18/2022)   Received from [facility], [facility]   Social Network    How would you rate your social network (family, work, friends)?: Good  participation with social networks      Family History  Problem Relation Age of Onset   Hypertension Mother    Asthma Brother    Diabetes Maternal Grandmother    Heart failure Maternal Grandmother    Hypertension Paternal Grandmother    Heart disease Paternal Grandmother    Diabetes Paternal Grandmother    Cancer Neg Hx       ROS:   Please see the history of present illness.     All other systems reviewed and are negative.   Labs/EKG Reviewed:  EKG:   EKG is  ordered today.  The ekg ordered today demonstrates   Recent Labs: 12/29/2022: ALT 14; BUN 5; Potassium 4.0; Sodium 136 06/07/2023: Creatinine, Ser 0.59; Hemoglobin 11.1; Platelets 246   Recent Lipid Panel No results found for: "CHOL", "TRIG", "HDL", "CHOLHDL", "LDLCALC", "LDLDIRECT"  Physical Exam:    VS:  BP 110/70 (BP Location: Left Arm, Patient Position: Sitting, Cuff Size: Normal)   Ht 5\' 7"  (1.702 m)   Wt 192 lb 6.4 oz (87.3 kg)   LMP 10/30/2022   BMI 30.13 kg/m     Wt Readings from Last 3 Encounters:  06/23/23 193 lb 4.8 oz (87.7 kg)  06/23/23 192 lb 6.4 oz (87.3 kg)  06/07/23 194 lb 3.2 oz (88.1 kg)     GEN:  Well nourished, well developed in no acute distress HEENT: Normal NECK: No JVD; No carotid bruits LYMPHATICS: No lymphadenopathy CARDIAC: RRR, no murmurs, rubs, gallops RESPIRATORY:  Clear to auscultation without rales, wheezing or rhonchi  ABDOMEN: Soft, non-tender, non-distended MUSCULOSKELETAL:  No edema; No deformity  SKIN: Warm and dry NEUROLOGIC:  Alert and oriented x 3 PSYCHIATRIC:  Normal affect    Risk Assessment/Risk Calculators:               ASSESSMENT & PLAN:    Coarctation of the aorta history of repair Postpartum hypertension Dyspnea on exertion  Her blood pressure in the office today is within target we will not adjust her antihypertensive medication.  Recent nosebleed on high dose ASA 162mg  daily, now back down to 81 mg daily.   I asked the patient to  take her blood pressure daily she had her blood pressure be increased 140/90 mmHg to notify my office via my chart.   She is WHO classification at maternal cardiovascular risk: Class II  I plan to follow the patient closely during her pregnancy in the cardio OB clinic.  She will be discharged postpartum back to her primary cardiologist: Dr. Janne Napoleon.  Patient Instructions  Medication Instructions:  Your physician recommends that you continue on your current medications as directed. Please refer to the Current Medication list given to you today.  *If you need a refill on your cardiac medications before your next appointment, please call your pharmacy*   Lab Work: None   Testing/Procedures: None   Follow-Up: At [facility], you and your health needs are our priority.  As part of our continuing mission to provide you with exceptional heart care, we have created designated Provider Care Teams.  These Care Teams include your primary Cardiologist (physician) and Advanced Practice Providers (APPs -  Physician Assistants and Nurse Practitioners) who all work together to provide you with the care you need, when you need it.   Your next appointment:   4 week(s)  Provider:   Thomasene Ripple, DO    Dispo:  No follow-ups on file.   Medication Adjustments/Labs and Tests Ordered: Current medicines are reviewed at length with the patient today.  Concerns regarding medicines are outlined above.  Tests Ordered: No orders of the defined types were placed in this encounter.  Medication Changes: No orders of the defined types were placed in this encounter.

## 2023-07-01 ENCOUNTER — Other Ambulatory Visit: Payer: Self-pay

## 2023-07-01 ENCOUNTER — Ambulatory Visit: Payer: BC Managed Care – PPO | Admitting: *Deleted

## 2023-07-01 ENCOUNTER — Other Ambulatory Visit: Payer: Self-pay | Admitting: *Deleted

## 2023-07-01 ENCOUNTER — Ambulatory Visit: Payer: BC Managed Care – PPO | Attending: Obstetrics

## 2023-07-01 ENCOUNTER — Encounter: Payer: Self-pay | Admitting: *Deleted

## 2023-07-01 VITALS — BP 141/77 | HR 86

## 2023-07-01 DIAGNOSIS — O0993 Supervision of high risk pregnancy, unspecified, third trimester: Secondary | ICD-10-CM | POA: Diagnosis present

## 2023-07-01 DIAGNOSIS — O10013 Pre-existing essential hypertension complicating pregnancy, third trimester: Secondary | ICD-10-CM | POA: Diagnosis not present

## 2023-07-01 DIAGNOSIS — Z3A34 34 weeks gestation of pregnancy: Secondary | ICD-10-CM | POA: Diagnosis not present

## 2023-07-01 DIAGNOSIS — O09523 Supervision of elderly multigravida, third trimester: Secondary | ICD-10-CM

## 2023-07-01 DIAGNOSIS — O10913 Unspecified pre-existing hypertension complicating pregnancy, third trimester: Secondary | ICD-10-CM

## 2023-07-01 DIAGNOSIS — O43193 Other malformation of placenta, third trimester: Secondary | ICD-10-CM

## 2023-07-01 DIAGNOSIS — O43199 Other malformation of placenta, unspecified trimester: Secondary | ICD-10-CM

## 2023-07-06 NOTE — Progress Notes (Unsigned)
   PRENATAL VISIT NOTE  Subjective:  Cassandra Hoover is a 37 y.o. G2P1001 at [redacted]w[redacted]d being seen today for ongoing prenatal care.  She is currently monitored for the following issues for this {Blank single:19197::"high-risk","low-risk"} pregnancy and has Aortic valve disorder; Coarctation of aorta, congenital; Rh negative state in antepartum period; Chronic hypertension in pregnancy; Supervision of high-risk pregnancy; Advanced maternal age in multigravida; Generalized anxiety disorder; Irritable bowel syndrome; and Marginal insertion of umbilical cord affecting management of mother on their problem list.  Patient reports {sx:14538}.   .  .   . Denies leaking of fluid.   The following portions of the patient's history were reviewed and updated as appropriate: allergies, current medications, past family history, past medical history, past social history, past surgical history and problem list.   Objective:  There were no vitals filed for this visit.  Fetal Status:           General:  Alert, oriented and cooperative. Patient is in no acute distress.  Skin: Skin is warm and dry. No rash noted.   Cardiovascular: Normal heart rate noted  Respiratory: Normal respiratory effort, no problems with respiration noted  Abdomen: Soft, gravid, appropriate for gestational age.        Pelvic: {Blank single:19197::"Cervical exam performed in the presence of a chaperone","Cervical exam deferred"}        Extremities: Normal range of motion.     Mental Status: Normal mood and affect. Normal behavior. Normal judgment and thought content.   Assessment and Plan:  Pregnancy: G2P1001 at [redacted]w[redacted]d 1. Supervision of high risk pregnancy in third trimester ***  2. [redacted] weeks gestation of pregnancy ***  3. Generalized anxiety disorder ***  4. Multigravida of advanced maternal age in third trimester ***  5. Marginal insertion of umbilical cord affecting management of mother ***  {Blank single:19197::"Term","Preterm"}  labor symptoms and general obstetric precautions including but not limited to vaginal bleeding, contractions, leaking of fluid and fetal movement were reviewed in detail with the patient. Please refer to After Visit Summary for other counseling recommendations.   No follow-ups on file.  Future Appointments  Date Time Provider Department Center  07/07/2023  8:15 AM [facility] US2 [facility]  07/07/2023  8:55 AM Bernerd Limbo, CNM [facility]  07/13/2023 10:30 AM [address] [facility] None  07/14/2023  1:15 PM [facility] NURSE [facility]  07/14/2023  1:30 PM [facility] US2 [facility]  07/20/2023  9:40 AM Tobb, Kardie, DO [facility] None  07/21/2023 10:15 AM Bernerd Limbo, CNM [facility]  07/22/2023  3:15 PM [facility] NURSE [facility]  07/22/2023  3:30 PM [facility] US6 [facility]  07/23/2023  8:20 AM Jodelle Red, MD [facility]    Bernerd Limbo, CNM

## 2023-07-07 ENCOUNTER — Encounter: Payer: BC Managed Care – PPO | Admitting: Certified Nurse Midwife

## 2023-07-07 ENCOUNTER — Other Ambulatory Visit: Payer: Self-pay

## 2023-07-07 ENCOUNTER — Ambulatory Visit: Payer: BC Managed Care – PPO

## 2023-07-07 ENCOUNTER — Ambulatory Visit: Payer: BC Managed Care – PPO | Admitting: Certified Nurse Midwife

## 2023-07-07 VITALS — BP 112/75 | HR 98 | Wt 195.0 lb

## 2023-07-07 DIAGNOSIS — O09523 Supervision of elderly multigravida, third trimester: Secondary | ICD-10-CM

## 2023-07-07 DIAGNOSIS — F411 Generalized anxiety disorder: Secondary | ICD-10-CM

## 2023-07-07 DIAGNOSIS — Z3A34 34 weeks gestation of pregnancy: Secondary | ICD-10-CM | POA: Diagnosis not present

## 2023-07-07 DIAGNOSIS — O0993 Supervision of high risk pregnancy, unspecified, third trimester: Secondary | ICD-10-CM

## 2023-07-07 DIAGNOSIS — O43123 Velamentous insertion of umbilical cord, third trimester: Secondary | ICD-10-CM

## 2023-07-07 DIAGNOSIS — O99343 Other mental disorders complicating pregnancy, third trimester: Secondary | ICD-10-CM

## 2023-07-07 DIAGNOSIS — O10913 Unspecified pre-existing hypertension complicating pregnancy, third trimester: Secondary | ICD-10-CM

## 2023-07-07 DIAGNOSIS — O43199 Other malformation of placenta, unspecified trimester: Secondary | ICD-10-CM

## 2023-07-13 ENCOUNTER — Encounter (INDEPENDENT_AMBULATORY_CARE_PROVIDER_SITE_OTHER): Payer: Self-pay

## 2023-07-13 ENCOUNTER — Ambulatory Visit (INDEPENDENT_AMBULATORY_CARE_PROVIDER_SITE_OTHER): Payer: BC Managed Care – PPO | Admitting: Otolaryngology

## 2023-07-13 VITALS — Wt 195.0 lb

## 2023-07-13 DIAGNOSIS — R040 Epistaxis: Secondary | ICD-10-CM

## 2023-07-13 DIAGNOSIS — R0981 Nasal congestion: Secondary | ICD-10-CM

## 2023-07-13 MED ORDER — MUPIROCIN 2 % EX OINT: 1.0000 | TOPICAL_OINTMENT | Freq: Two times a day (BID) | CUTANEOUS | 0 refills | Status: DC

## 2023-07-13 MED ORDER — AYR SALINE NASAL NA GEL: 1.0000 | Freq: Four times a day (QID) | NASAL | 5 refills | Status: DC

## 2023-07-13 NOTE — Progress Notes (Signed)
Dear Dr. Judd Lien, Here is my assessment for our mutual patient, Cassandra Hoover. Thank you for allowing me the opportunity to care for your patient. Please do not hesitate to contact me should you have any other questions. Sincerely, Dr. Jovita Kussmaul  Otolaryngology Clinic Note  HISTORY: Deniesha Hoover is a 37 y.o. female kindly referred by Dr. Judd Lien for evaluation of epistaxis  Initial visit (07/2023): She is currently [redacted] weeks pregnant. Epistaxis history: June 2024 first time Frequency: monthly, but getting worse. Now every 2-3 times per week.  Side: mostly left before, now mostly right Intervention: afrin and pressure works most of the time Duration: hours; Went to MAU for a 5 hour bleed before in October, but did not require intervention. Onset: random HTN: yes (during pregnancy) CKD/Liver dysfunction: no Anticoagulation/AP: ASA 81 Trauma: no History of Sinusitis: no Nasal obstruction: no Nasal procedures: no Current nasal medication use: flonase prior (because of nasal congestion, likely due to pregnancy related congestion - stopped early Nov, which has improved frequency of nose bleeds). Using humidifier, vaseline and nasal gels Uses some loratidine for nasal congestion Mild AR symptoms (nasal itching)  H&N Surgery: tonsillectomy  No problems with bleeding or anesthesia  PMHx: Anxiety, Coarctation of Aorta (prior, repaired)  Never smoked  RADIOGRAPHIC EVALUATION AND INDEPENDENT REVIEW OF OTHER RECORDS:: No CT Noted chart notes (06/08/2023): had nose bleed, reviewed photos 06/07/2023: MAU visit - epistaxis, tried saline, afrin, refer to ENT if persistent 05/2023 CBC: Hgb 11, plt 246 06/2023 (Dr. Servando Salina) - Coarct aorta, HTN, DOE - down to ASA 81 daily, BP daily  Past Medical History:  Diagnosis Date   Coarctation of aorta 05/17/2013   Since childhood, Coarct 1.7cmX1.8cm   Coarctation of aorta, congenital    surgical repair at 20 days old and 13 years   Gestational  hypertension, third trimester 06/05/2020   Obstetric vaginal laceration with second degree perineal laceration 06/06/2020   Rh negative state in antepartum period 11/27/2019   Past Surgical History:  Procedure Laterality Date   BREAST ENHANCEMENT SURGERY     COARCTATION OF AORTA REPAIR     REFRACTIVE SURGERY     TONSILLECTOMY     Family History  Problem Relation Age of Onset   Hypertension Mother    Asthma Brother    Diabetes Maternal Grandmother    Heart failure Maternal Grandmother    Hypertension Paternal Grandmother    Heart disease Paternal Grandmother    Diabetes Paternal Grandmother    Cancer Neg Hx    Social History   Tobacco Use   Smoking status: Never   Smokeless tobacco: Never  Substance Use Topics   Alcohol use: Not Currently    Alcohol/week: 3.0 - 4.0 standard drinks of alcohol    Types: 3 - 4 Glasses of wine per week    Comment: socially   Allergies  Allergen Reactions   Sudafed [Pseudoephedrine] Palpitations   Current Outpatient Medications  Medication Sig Dispense Refill   aspirin EC 81 MG tablet Take 1 tablet (81 mg total) by mouth daily. Take after 12 weeks for prevention of preeclampsia later in pregnancy 300 tablet 2   Blood Pressure Monitoring (BLOOD PRESSURE MONITOR AUTOMAT) DEVI 1 Device by Does not apply route daily. Automatic blood pressure cuff regular size. To monitor blood pressure regularly at home. ICD-10 code: O09.90 1 each 0   Cholecalciferol (D3-50 PO) Take 2,000 Units by mouth daily.      CHOLINE PO Take by mouth.     Ferrous Sulfate (  IRON PO) Take by mouth.     Loratadine 10 MG CAPS Take by mouth.      Magnesium Oxide (MAG-OXIDE) 200 MG TABS Take 2 tablets (400 mg total) by mouth at bedtime. If that amount causes loose stools in the am, switch to 200mg  daily at bedtime. (Patient taking differently: Take 400 mg by mouth at bedtime.) 60 tablet 3   mupirocin ointment (BACTROBAN) 2 % Apply 1 Application topically 2 (two) times daily. Apply  as shown on end of finger twice daily for 10 days 22 g 0   Prenatal MV & Min w/FA-DHA (ONE A DAY PRENATAL PO) Take 1 tablet by mouth daily.     PSYLLIUM HUSK PO Take by mouth.     saline (AYR) GEL Place 1 Application into both nostrils every 6 (six) hours. Apply using end of finger every 4-6 hours as needed for congestion and dryness 14 g 5   sertraline (ZOLOFT) 25 MG tablet Take 3 tablets (75 mg total) by mouth daily. 270 tablet 6   labetalol (NORMODYNE) 100 MG tablet Take 0.5 tablets (50 mg total) by mouth 2 (two) times daily. 90 tablet 3   No current facility-administered medications for this visit.   Wt 195 lb (88.5 kg)   LMP 10/30/2022   BMI 30.54 kg/m   PHYSICAL EXAM:  Wt 195 lb (88.5 kg)   LMP 10/30/2022   BMI 30.54 kg/m    Salient findings:  CN II-XII intact  Bilateral EAC clear and TM intact with well pneumatized middle ear spaces Nose: Anterior rhinoscopy reveals relatively midline septum without obvious caudal anterior septal vessels noted with headlight that would be source of bleeding. Mild bilateral inferior turbinate hypertrophy.  Nasal endoscopy was indicated to better evaluate the nose and paranasal sinuses, given the patient's history and exam findings, and is detailed below, and noted to have mid-septum tuft of blood vessels bilaterally, which was thought to be likely source of bleeding. After R/B/A, endoscopic cauterization of septum for epistaxis was performed (see below) No lesions of oral cavity/oropharynx No obviously palpable neck masses/lymphadenopathy/thyromegaly No respiratory distress or stridor   PROCEDURE: Bilateral Rigid Nasal Endoscopy with endoscopic control of right sided epistaxis (CPT [phone]) - 25 Pre-procedure diagnosis: Epistaxis - right Post-procedure diagnosis: same Indication: See pre-procedure diagnosis and physical exam above Complications: None apparent EBL: 0 mL Anesthesia: Lidocaine 4% and topical decongestant was topically sprayed in  each nasal cavity  Description of Procedure:  Patient was identified as correct patient.  Afrin/lidocaine mix was sprayed into the nose in both nasal cavities. The decongestant was allowed to work.A headlight and speculum were used but unable to identify the source of bleeding. Therefore, nasal endoscopy was necessary to identify and control epistaxis source A rigid 0 degree endoscope was utilized to evaluate the sinonasal cavities, mucosa, sinus ostia and turbinates and septum bilaterally. On the left, there was a tuft of vessels over the mid-septum but otherwise no bleeding was noted. On the contralateral side, there was an area over the mid-septum was as well with tuft of vessels, which was most likely the source of bleeding. After discussion of R/B/A and given difficulty in accessing area with just a headlight and speculum, decision was performed to perform endoscope cauterization after consent. Using the endoscope, the vessels visualized and then spot cauterized using silver nitrate cautery. Patient was observed for about 10-15 minutes and no bleeding was seen. Mupirocin ointment was applied. Patient tolerated the procedure well. Photodoc obtained and sent to be scanned  in chart.  ASSESSMENT:  Ms. Urness is a 37 y.o. F with:  1. Epistaxis   2. Nasal congestion    Has tried humidification but failed. Mostly on right side now, but left side sometimes problematic. Endo pointed to right septal vessels which were cauterized. We've discussed issues and options today.  We reviewed the nasal endoscopy images together.  The risks, benefits and alternatives were discussed and questions answered.  She has elected to proceed with:  1) Continued humidification with ayr gel and humidifier; epistaxis precautions discussed 2) Mupirocin ointment to right nose BID x10d 2) Follow-up in 6 weeks -- sooner as necessary.  See below regarding exact medications prescribed this encounter including dosages and  route: Meds ordered this encounter  Medications   mupirocin ointment (BACTROBAN) 2 %    Sig: Apply 1 Application topically 2 (two) times daily. Apply as shown on end of finger twice daily for 10 days    Dispense:  22 g    Refill:  0   saline (AYR) GEL    Sig: Place 1 Application into both nostrils every 6 (six) hours. Apply using end of finger every 4-6 hours as needed for congestion and dryness    Dispense:  14 g    Refill:  5   Thank you for allowing me the opportunity to care for your patient. Please do not hesitate to contact me should you have any other questions.  Sincerely, Jovita Kussmaul, MD Otolarynoglogist (ENT), [facility] Phone: [phone] Fax: [phone]  MDM:  Level 4: [phone] Complexity/Problems addressed: chronic problem (epistaxis) but now worsening/exacerbation Data complexity: mod -  independent interpretation of notes and labs - Morbidity: mod  - Prescription Drug prescribed or managed: yes  07/24/2023, 6:56 PM

## 2023-07-14 ENCOUNTER — Other Ambulatory Visit: Payer: Self-pay | Admitting: *Deleted

## 2023-07-14 ENCOUNTER — Ambulatory Visit: Payer: BC Managed Care – PPO | Attending: Obstetrics and Gynecology

## 2023-07-14 ENCOUNTER — Ambulatory Visit: Payer: BC Managed Care – PPO | Admitting: *Deleted

## 2023-07-14 ENCOUNTER — Other Ambulatory Visit: Payer: Self-pay

## 2023-07-14 VITALS — BP 118/68 | HR 104

## 2023-07-14 DIAGNOSIS — O43193 Other malformation of placenta, third trimester: Secondary | ICD-10-CM

## 2023-07-14 DIAGNOSIS — O43199 Other malformation of placenta, unspecified trimester: Secondary | ICD-10-CM | POA: Diagnosis present

## 2023-07-14 DIAGNOSIS — Z3A35 35 weeks gestation of pregnancy: Secondary | ICD-10-CM

## 2023-07-14 DIAGNOSIS — O10013 Pre-existing essential hypertension complicating pregnancy, third trimester: Secondary | ICD-10-CM | POA: Diagnosis not present

## 2023-07-14 DIAGNOSIS — O10913 Unspecified pre-existing hypertension complicating pregnancy, third trimester: Secondary | ICD-10-CM | POA: Diagnosis present

## 2023-07-14 DIAGNOSIS — O0993 Supervision of high risk pregnancy, unspecified, third trimester: Secondary | ICD-10-CM

## 2023-07-14 DIAGNOSIS — O09523 Supervision of elderly multigravida, third trimester: Secondary | ICD-10-CM | POA: Insufficient documentation

## 2023-07-18 ENCOUNTER — Encounter: Payer: Self-pay | Admitting: Obstetrics and Gynecology

## 2023-07-20 ENCOUNTER — Ambulatory Visit: Payer: BC Managed Care – PPO | Attending: Cardiology | Admitting: Cardiology

## 2023-07-20 ENCOUNTER — Encounter: Payer: Self-pay | Admitting: Cardiology

## 2023-07-20 VITALS — BP 132/84 | HR 90 | Ht 67.0 in | Wt 196.4 lb

## 2023-07-20 DIAGNOSIS — Q251 Coarctation of aorta: Secondary | ICD-10-CM

## 2023-07-20 DIAGNOSIS — O10919 Unspecified pre-existing hypertension complicating pregnancy, unspecified trimester: Secondary | ICD-10-CM | POA: Diagnosis not present

## 2023-07-20 DIAGNOSIS — Z3A36 36 weeks gestation of pregnancy: Secondary | ICD-10-CM

## 2023-07-20 DIAGNOSIS — I10 Essential (primary) hypertension: Secondary | ICD-10-CM

## 2023-07-20 MED ORDER — LABETALOL HCL 100 MG PO TABS: 50.0000 mg | ORAL_TABLET | Freq: Two times a day (BID) | ORAL | 3 refills | Status: DC

## 2023-07-20 NOTE — Patient Instructions (Addendum)
Medication Instructions:  Your physician has recommended you make the following change in your medication:  START: Labetalol 50 mg (half tablet) twice daily  Please take your blood pressure daily for 1 week and send in a MyChart message. Please include heart rates. (One message at the end of the week).   HOW TO TAKE YOUR BLOOD PRESSURE: Rest 5 minutes before taking your blood pressure. Don't smoke or drink caffeinated beverages for at least 30 minutes before. Take your blood pressure before (not after) you eat. Sit comfortably with your back supported and both feet on the floor (don't cross your legs). Elevate your arm to heart level on a table or a desk. Use the proper sized cuff. It should fit smoothly and snugly around your bare upper arm. There should be enough room to slip a fingertip under the cuff. The bottom edge of the cuff should be 1 inch above the crease of the elbow. Ideally, take 3 measurements at one sitting and record the average.  *If you need a refill on your cardiac medications before your next appointment, please call your pharmacy*   Follow-Up: At [facility], you and your health needs are our priority.  As part of our continuing mission to provide you with exceptional heart care, we have created designated Provider Care Teams.  These Care Teams include your primary Cardiologist (physician) and Advanced Practice Providers (APPs -  Physician Assistants and Nurse Practitioners) who all work together to provide you with the care you need, when you need it.  Your next appointment:   4 week(s) via MyChart  Provider:   Thomasene Ripple, DO   Other Instructions You are invited to attend: [facility] event on February Friday 7th 2025 at [facility] ([address] [place], [place], [place] [address]) from 8am-12pm. Feel free to invite other women to attend!  See you there!

## 2023-07-20 NOTE — Telephone Encounter (Signed)
Pt seen in office today. Dr. Servando Salina shown BP log by pt.

## 2023-07-20 NOTE — Progress Notes (Signed)
   PRENATAL VISIT NOTE  Subjective:  Cassandra Hoover is a 37 y.o. G2P1001 at [redacted]w[redacted]d being seen today for ongoing prenatal care.  She is currently monitored for the following issues for this high-risk pregnancy and has Aortic valve disorder; Coarctation of aorta, congenital; Rh negative state in antepartum period; Chronic hypertension in pregnancy; Supervision of high-risk pregnancy; Advanced maternal age in multigravida; Generalized anxiety disorder; Irritable bowel syndrome; and Marginal insertion of umbilical cord affecting management of mother on their problem list.  Patient reports no complaints.  Contractions: Irritability. Vag. Bleeding: None.  Movement: Present. Denies leaking of fluid.   The following portions of the patient's history were reviewed and updated as appropriate: allergies, current medications, past family history, past medical history, past social history, past surgical history and problem list.   Objective:   Vitals:   07/21/23 1100  BP: 131/84  Pulse: 95  Weight: 196 lb 12.8 oz (89.3 kg)    Fetal Status: Fetal Heart Rate (bpm): 125   Movement: Present     General:  Alert, oriented and cooperative. Patient is in no acute distress.  Skin: Skin is warm and dry. No rash noted.   Cardiovascular: Normal heart rate noted  Respiratory: Normal respiratory effort, no problems with respiration noted  Abdomen: Soft, gravid, appropriate for gestational age.  Pain/Pressure: Present     Pelvic: Cervical exam deferred        Extremities: Normal range of motion.  Edema: None  Mental Status: Normal mood and affect. Normal behavior. Normal judgment and thought content.   Assessment and Plan:  Pregnancy: G2P1001 at [redacted]w[redacted]d 1. Supervision of high risk pregnancy in third trimester - Doing well, feeling regular and vigorous fetal movement   2. [redacted] weeks gestation of pregnancy - Routine OB care including GBS testing today  3. Marginal insertion of umbilical cord affecting management of  mother - Normal growth on last u/s  4. Multigravida of advanced maternal age in third trimester - Taking daily aspirin, being followed by MFM, planning 39wk IOL  5. Rh negative state in antepartum period - Rhogam given in prior visit  6. Chronic hypertension in pregnancy - Taken off labetalol by Dr. Laurell Josephs (MFM), but put back on after repeated BP >130/80 by Dr, Servando Salina (Cards). Today BP > 130/80 and pt record shows majority of daily BP in the 130s/80s.  - Consulted Dr. Servando Salina who agreed pt should return to 100mg  BID labetalol and remain on it until delivery at 39 weeks. - Dose increased  Term labor symptoms and general obstetric precautions including but not limited to vaginal bleeding, contractions, leaking of fluid and fetal movement were reviewed in detail with the patient. Please refer to After Visit Summary for other counseling recommendations.   Return in about 4 weeks (around 08/18/2023) for IN-PERSON, LOB.  Future Appointments  Date Time Provider Department Center  07/28/2023  2:55 PM [facility]  07/28/2023  3:35 PM Bernerd Limbo, CNM [facility]  08/05/2023  6:30 AM [facility] SCHED ROOM [facility] None  08/18/2023 10:00 AM Tobb, Kardie, DO [facility] None  08/24/2023  2:00 PM [address] [facility] None    Bernerd Limbo, CNM

## 2023-07-20 NOTE — Progress Notes (Signed)
Cardio-Obstetrics Clinic  New Evaluation  Date:  07/20/2023   ID:  Cassandra Hoover, DOB 1986-05-28, MRN [id number]  PCP:  Macy Mis, MD   [facility] Providers Cardiologist:  Jodelle Red, MD  Electrophysiologist:  None       Referring MD: Macy Mis, MD   Chief Complaint: " I am doing well"   History of Present Illness:    Cassandra Hoover is a 37 y.o. female [G2P1001] with hx of includes coarctation of the aorta follows cardiovascular care in pregnancy.  Hx of hypertensive disorder in pregnancy, coarctation of the aorta.   At her last visit she shared with me that she has been experiencing nosebleed. But has resolved prior to our visit after cutting back on the aspirin.   She has been very diligent in getting her blood pressure reading to [place].  She recently visited with MFM and was taken off the Labetalol. Since she had been off her medications she shared her updated bp.   Dec. 4.    132/81 Dec. 5.     133/84; 126/80 Dec. 6.     138/87 Dec. 7.     136/90; 137/85 Dec. 8.     135/90 Dec. 9.     135/84 Dec. 10.   133/80   Prior CV Studies Reviewed: The following studies were reviewed today: Cardiac MR 09/2019 CONTRAST:  [id number]   FINDINGS: Normal LA/RA size. Normal RV size and function No ASD/PFO/VSD. No pericardial effusion normal MV,TV. Aortic valve is tri-leaflet with no significant stenosis or regurgitation. Normal LV size and function. Quantitative EF 59% (EDV 114 cc ESV 47 cc SV 68 cc) Septal thickness normal 8 mm no LVH. Delayed gadolinium images showed no hyper-enhancement of the LV myocardium   Aorta: There is fusiform relative dilatation of the ascending aortic root. The great vessels exit the arch normally The arch itself is relatively hypoplastic. Just distal to the left subclavian take off there is residual "pseudo coarctation" at the likely sight of previous repair. This area narrows down to a diameter of 13 mm.  Flow analysis through this area precisely was not performed   Aortic Sinus: 28 mm   STJ 25 mm   Aortic Root: 34 mm   Aortic Arch 18 mm   Pseudo Coarctation: 13 mm just distal to left subclavian take off   Proximal descending thoracic aorta 24 mm   Distal descending thoracic aorta 17 mm   IMPRESSION: 1. Normal tri- leaflet aortic valve   2. Relative dilatation of the ascending aortic root at 34 mm with hypoplastic arch 17 mm. Residual "pseudo-coarctation" likely at site of previous repair with residual minimal luminal diameter of 13 mm. Flow analysis through the smallest area not performed   3.  Normal LV size and function EF 59%   4.  No delayed LV myocardial enhancement post gadolinium  Past Medical History:  Diagnosis Date   Coarctation of aorta 05/17/2013   Since childhood, Coarct 1.7cmX1.8cm   Coarctation of aorta, congenital    surgical repair at 46 days old and 13 years   Gestational hypertension, third trimester 06/05/2020   Obstetric vaginal laceration with second degree perineal laceration 06/06/2020   Rh negative state in antepartum period 11/27/2019    Past Surgical History:  Procedure Laterality Date   BREAST ENHANCEMENT SURGERY     COARCTATION OF AORTA REPAIR     REFRACTIVE SURGERY     TONSILLECTOMY        OB History  Gravida  2   Para  1   Term  1   Preterm      AB      Living  1      SAB      IAB      Ectopic      Multiple  0   Live Births  1               Current Medications: Current Meds  Medication Sig   aspirin EC 81 MG tablet Take 1 tablet (81 mg total) by mouth daily. Take after 12 weeks for prevention of preeclampsia later in pregnancy   Blood Pressure Monitoring (BLOOD PRESSURE MONITOR AUTOMAT) DEVI 1 Device by Does not apply route daily. Automatic blood pressure cuff regular size. To monitor blood pressure regularly at home. ICD-10 code: O09.90   Cholecalciferol (D3-50 PO) Take 2,000 Units by mouth daily.     CHOLINE PO Take by mouth.   Ferrous Sulfate (IRON PO) Take by mouth.   labetalol (NORMODYNE) 100 MG tablet Take 0.5 tablets (50 mg total) by mouth 2 (two) times daily.   Loratadine 10 MG CAPS Take by mouth.    Magnesium Oxide (MAG-OXIDE) 200 MG TABS Take 2 tablets (400 mg total) by mouth at bedtime. If that amount causes loose stools in the am, switch to 200mg  daily at bedtime. (Patient taking differently: Take 400 mg by mouth at bedtime.)   mupirocin ointment (BACTROBAN) 2 % Apply 1 Application topically 2 (two) times daily. Apply as shown on end of finger twice daily for 10 days   Prenatal MV & Min w/FA-DHA (ONE A DAY PRENATAL PO) Take 1 tablet by mouth daily.   PSYLLIUM HUSK PO Take by mouth.   saline (AYR) GEL Place 1 Application into both nostrils every 6 (six) hours. Apply using end of finger every 4-6 hours as needed for congestion and dryness   sertraline (ZOLOFT) 25 MG tablet Take 3 tablets (75 mg total) by mouth daily.     Allergies:   Sudafed [pseudoephedrine]   Social History   Socioeconomic History   Marital status: Married    Spouse name: Not on file   Number of children: Not on file   Years of education: Not on file   Highest education level: Bachelor's degree (e.g., BA, AB, BS)  Occupational History   Not on file  Tobacco Use   Smoking status: Never   Smokeless tobacco: Never  Vaping Use   Vaping status: Never Used  Substance and Sexual Activity   Alcohol use: Not Currently    Alcohol/week: 3.0 - 4.0 standard drinks of alcohol    Types: 3 - 4 Glasses of wine per week    Comment: socially   Drug use: Never   Sexual activity: Yes  Other Topics Concern   Not on file  Social History Narrative   Not on file   Social Determinants of Health   Financial Resource Strain: Low Risk  (06/25/2022)   Received from [facility]   Overall Financial Resource Strain (CARDIA)    Difficulty of Paying Living Expenses: Not hard at all  Food Insecurity: No  Food Insecurity (06/25/2022)   Received from [facility], [facility]   Hunger Vital Sign    Worried About Running Out of Food in the Last Year: Never true    Ran Out of Food in the Last Year: Never true  Transportation Needs: No Transportation Needs (06/01/2021)   Received from [facility], [facility]   PRAPARE - Transportation    Lack of Transportation (Medical): No    Lack of Transportation (Non-Medical): No  Physical Activity: Insufficiently Active (06/25/2022)   Received from [facility], [facility]   Exercise Vital Sign    Days of Exercise per Week: 2 days    Minutes of Exercise per Session: 40 min  Stress: Stress Concern Present (06/25/2022)   Received from [facility] of Occupational Health - Occupational Stress Questionnaire    Feeling of Stress : To some extent  Social Connections: Socially Integrated (08/18/2022)   Received from [facility], [facility]   Social Network    How would you rate your social network (family, work, friends)?: Good participation with social networks      Family History  Problem Relation Age of Onset   Hypertension Mother    Asthma Brother    Diabetes Maternal Grandmother    Heart failure Maternal Grandmother    Hypertension Paternal Grandmother    Heart disease Paternal Grandmother    Diabetes Paternal Grandmother    Cancer Neg Hx       ROS:   Please see the history of present illness.     All other systems reviewed and are negative.   Labs/EKG Reviewed:    EKG:   EKG is  ordered today.  The ekg ordered today demonstrates   Recent Labs: 12/29/2022: ALT 14; BUN 5; Potassium 4.0; Sodium 136 06/07/2023: Creatinine, Ser 0.59; Hemoglobin 11.1; Platelets 246   Recent Lipid Panel No results found for: "CHOL", "TRIG", "HDL", "CHOLHDL", "LDLCALC", "LDLDIRECT"  Physical Exam:    VS:  BP 132/84 (BP Location: Left Arm, Patient Position: Sitting, Cuff Size: Normal)   Pulse 90   Ht  5\' 7"  (1.702 m)   Wt 196 lb 6.4 oz (89.1 kg)   LMP 10/30/2022   SpO2 98%   BMI 30.76 kg/m     Wt Readings from Last 3 Encounters:  07/20/23 196 lb 6.4 oz (89.1 kg)  07/13/23 195 lb (88.5 kg)  07/07/23 195 lb (88.5 kg)     GEN:  Well nourished, well developed in no acute distress HEENT: Normal NECK: No JVD; No carotid bruits LYMPHATICS: No lymphadenopathy CARDIAC: RRR, no murmurs, rubs, gallops RESPIRATORY:  Clear to auscultation without rales, wheezing or rhonchi  ABDOMEN: Soft, non-tender, non-distended MUSCULOSKELETAL:  No edema; No deformity  SKIN: Warm and dry NEUROLOGIC:  Alert and oriented x 3 PSYCHIATRIC:  Normal affect    Risk Assessment/Risk Calculators:               ASSESSMENT & PLAN:    Coarctation of the aorta history of repair Chronic hypertension in pregnancy Postpartum hypertension Dyspnea on exertion  She is [redacted] weeks pregnant.   She will benefit from resuming her antihypertensive - will start at Labetalol 50 mg BID and titrate up as appropriate..   Continue aspirin for preeclampsia prophylaxis  I asked the patient to take her blood pressure daily she had her blood pressure be increased 140/90 mmHg to notify my office via my chart.   She is WHO classification at maternal cardiovascular risk: Class II  I plan to follow the patient closely during her pregnancy and immediate post partum in the cardio OB clinic.  She will be discharged postpartum back to her primary cardiologist: Dr. Jodelle Red.  Patient Instructions  Medication Instructions:  Your physician has recommended you make the following change in your medication:  START: Labetalol 50 mg (half tablet) twice daily  Please take your blood pressure daily for 1 week and send in a MyChart message. Please include heart rates. (One message at the end of the week).   HOW TO TAKE YOUR BLOOD PRESSURE: Rest 5 minutes before taking your blood pressure. Don't smoke or drink caffeinated  beverages for at least 30 minutes before. Take your blood pressure before (not after) you eat. Sit comfortably with your back supported and both feet on the floor (don't cross your legs). Elevate your arm to heart level on a table or a desk. Use the proper sized cuff. It should fit smoothly and snugly around your bare upper arm. There should be enough room to slip a fingertip under the cuff. The bottom edge of the cuff should be 1 inch above the crease of the elbow. Ideally, take 3 measurements at one sitting and record the average.  *If you need a refill on your cardiac medications before your next appointment, please call your pharmacy*   Follow-Up: At [facility], you and your health needs are our priority.  As part of our continuing mission to provide you with exceptional heart care, we have created designated Provider Care Teams.  These Care Teams include your primary Cardiologist (physician) and Advanced Practice Providers (APPs -  Physician Assistants and Nurse Practitioners) who all work together to provide you with the care you need, when you need it.  Your next appointment:   4 week(s) via MyChart  Provider:   Thomasene Ripple, DO   Other Instructions You are invited to attend: [facility] event on February Friday 7th 2025 at [facility] ([address] [place], [place], [place] [address]) from 8am-12pm. Feel free to invite other women to attend!  See you there!      Dispo:  No follow-ups on file.   Medication Adjustments/Labs and Tests Ordered: Current medicines are reviewed at length with the patient today.  Concerns regarding medicines are outlined above.  Tests Ordered: Orders Placed This Encounter  Procedures   EKG 12-Lead   Medication Changes: Meds ordered this encounter  Medications   labetalol (NORMODYNE) 100 MG tablet    Sig: Take 0.5 tablets (50 mg total) by mouth 2 (two) times daily.    Dispense:  90 tablet    Refill:  3

## 2023-07-21 ENCOUNTER — Other Ambulatory Visit (HOSPITAL_COMMUNITY)
Admission: RE | Admit: 2023-07-21 | Discharge: 2023-07-21 | Disposition: A | Discharge status: Home / Self Care | Payer: BC Managed Care – PPO | Source: Ambulatory Visit | Attending: Certified Nurse Midwife | Admitting: Certified Nurse Midwife

## 2023-07-21 ENCOUNTER — Other Ambulatory Visit: Payer: Self-pay

## 2023-07-21 ENCOUNTER — Ambulatory Visit (INDEPENDENT_AMBULATORY_CARE_PROVIDER_SITE_OTHER): Payer: BC Managed Care – PPO | Admitting: Certified Nurse Midwife

## 2023-07-21 VITALS — BP 131/84 | HR 95 | Wt 196.8 lb

## 2023-07-21 DIAGNOSIS — Z6791 Unspecified blood type, Rh negative: Secondary | ICD-10-CM

## 2023-07-21 DIAGNOSIS — O10913 Unspecified pre-existing hypertension complicating pregnancy, third trimester: Secondary | ICD-10-CM

## 2023-07-21 DIAGNOSIS — O26899 Other specified pregnancy related conditions, unspecified trimester: Secondary | ICD-10-CM

## 2023-07-21 DIAGNOSIS — O0993 Supervision of high risk pregnancy, unspecified, third trimester: Secondary | ICD-10-CM | POA: Insufficient documentation

## 2023-07-21 DIAGNOSIS — Z3A36 36 weeks gestation of pregnancy: Secondary | ICD-10-CM

## 2023-07-21 DIAGNOSIS — Z2911 Encounter for prophylactic immunotherapy for respiratory syncytial virus (RSV): Secondary | ICD-10-CM | POA: Diagnosis not present

## 2023-07-21 DIAGNOSIS — Z1332 Encounter for screening for maternal depression: Secondary | ICD-10-CM

## 2023-07-21 DIAGNOSIS — O09523 Supervision of elderly multigravida, third trimester: Secondary | ICD-10-CM

## 2023-07-21 DIAGNOSIS — O26893 Other specified pregnancy related conditions, third trimester: Secondary | ICD-10-CM

## 2023-07-21 DIAGNOSIS — O43199 Other malformation of placenta, unspecified trimester: Secondary | ICD-10-CM

## 2023-07-21 DIAGNOSIS — O43193 Other malformation of placenta, third trimester: Secondary | ICD-10-CM

## 2023-07-21 DIAGNOSIS — O10919 Unspecified pre-existing hypertension complicating pregnancy, unspecified trimester: Secondary | ICD-10-CM

## 2023-07-22 ENCOUNTER — Ambulatory Visit: Payer: BC Managed Care – PPO

## 2023-07-22 ENCOUNTER — Encounter: Payer: Self-pay | Admitting: General Practice

## 2023-07-22 DIAGNOSIS — Z3A37 37 weeks gestation of pregnancy: Secondary | ICD-10-CM | POA: Diagnosis not present

## 2023-07-22 DIAGNOSIS — O09523 Supervision of elderly multigravida, third trimester: Secondary | ICD-10-CM

## 2023-07-22 DIAGNOSIS — O43193 Other malformation of placenta, third trimester: Secondary | ICD-10-CM

## 2023-07-22 DIAGNOSIS — O10013 Pre-existing essential hypertension complicating pregnancy, third trimester: Secondary | ICD-10-CM | POA: Diagnosis not present

## 2023-07-22 DIAGNOSIS — O10913 Unspecified pre-existing hypertension complicating pregnancy, third trimester: Secondary | ICD-10-CM

## 2023-07-22 LAB — CERVICOVAGINAL ANCILLARY ONLY
Chlamydia: NEGATIVE
Comment: NEGATIVE
Comment: NEGATIVE
Comment: NORMAL
Neisseria Gonorrhea: NEGATIVE
Trichomonas: NEGATIVE

## 2023-07-23 ENCOUNTER — Ambulatory Visit (HOSPITAL_BASED_OUTPATIENT_CLINIC_OR_DEPARTMENT_OTHER): Payer: BC Managed Care – PPO | Admitting: Cardiology

## 2023-07-24 LAB — CULTURE, BETA STREP (GROUP B ONLY): Strep Gp B Culture: POSITIVE — AB

## 2023-07-27 NOTE — Progress Notes (Signed)
   PRENATAL VISIT NOTE  Subjective:  Cassandra Hoover is a 37 y.o. G2P1001 at [redacted]w[redacted]d being seen today for ongoing prenatal care.  She is currently monitored for the following issues for this high-risk pregnancy and has Aortic valve disorder; Coarctation of aorta, congenital; Rh negative state in antepartum period; Chronic hypertension in pregnancy; Supervision of high-risk pregnancy; Advanced maternal age in multigravida; Generalized anxiety disorder; Irritable bowel syndrome; Marginal insertion of umbilical cord affecting management of mother; and GBS (group B Streptococcus carrier), +RV culture, currently pregnant on their problem list.  Patient reports no complaints.  Contractions: Not present. Vag. Bleeding: None.  Movement: Present. Denies leaking of fluid.   The following portions of the patient's history were reviewed and updated as appropriate: allergies, current medications, past family history, past medical history, past social history, past surgical history and problem list.   Objective:   Vitals:   07/28/23 1512  BP: 125/73  Pulse: 79  Weight: 196 lb (88.9 kg)    Fetal Status: Fetal Heart Rate (bpm): 122   Movement: Present     General:  Alert, oriented and cooperative. Patient is in no acute distress.  Skin: Skin is warm and dry. No rash noted.   Cardiovascular: Normal heart rate noted  Respiratory: Normal respiratory effort, no problems with respiration noted  Abdomen: Soft, gravid, appropriate for gestational age.  Pain/Pressure: Present     Pelvic: Cervical exam deferred        Extremities: Normal range of motion.  Edema: None  Mental Status: Normal mood and affect. Normal behavior. Normal judgment and thought content.   Assessment and Plan:  Pregnancy: G2P1001 at [redacted]w[redacted]d 1. Supervision of high risk pregnancy in third trimester (Primary) - Doing well, feeling regular and vigorous fetal movement   2. [redacted] weeks gestation of pregnancy - Routine OB care  - NST reactive  3.  Chronic hypertension in pregnancy - BP stable on 100mg  labetalol BID - Scheduled for IOL on 12/26  Preterm labor symptoms and general obstetric precautions including but not limited to vaginal bleeding, contractions, leaking of fluid and fetal movement were reviewed in detail with the patient. Please refer to After Visit Summary for other counseling recommendations.   Return in about 5 weeks (around 09/01/2023) for IN-PERSON, PP.  Future Appointments  Date Time Provider Department Center  08/05/2023  6:30 AM [facility] SCHED ROOM [facility] None  08/18/2023 10:00 AM Tobb, Kardie, DO [facility] None  08/24/2023  2:00 PM [address] [facility] None  09/08/2023  3:55 PM Bernerd Limbo, CNM [facility]    Bernerd Limbo, CNM

## 2023-07-28 ENCOUNTER — Other Ambulatory Visit: Payer: Self-pay | Admitting: Advanced Practice Midwife

## 2023-07-28 ENCOUNTER — Ambulatory Visit (INDEPENDENT_AMBULATORY_CARE_PROVIDER_SITE_OTHER): Payer: BC Managed Care – PPO

## 2023-07-28 ENCOUNTER — Other Ambulatory Visit: Payer: Self-pay

## 2023-07-28 ENCOUNTER — Ambulatory Visit (INDEPENDENT_AMBULATORY_CARE_PROVIDER_SITE_OTHER): Payer: BC Managed Care – PPO | Admitting: Certified Nurse Midwife

## 2023-07-28 VITALS — BP 125/73 | HR 79 | Wt 196.0 lb

## 2023-07-28 DIAGNOSIS — O358XX0 Maternal care for other (suspected) fetal abnormality and damage, not applicable or unspecified: Secondary | ICD-10-CM

## 2023-07-28 DIAGNOSIS — O10013 Pre-existing essential hypertension complicating pregnancy, third trimester: Secondary | ICD-10-CM

## 2023-07-28 DIAGNOSIS — O10919 Unspecified pre-existing hypertension complicating pregnancy, unspecified trimester: Secondary | ICD-10-CM

## 2023-07-28 DIAGNOSIS — O09523 Supervision of elderly multigravida, third trimester: Secondary | ICD-10-CM

## 2023-07-28 DIAGNOSIS — Z3A37 37 weeks gestation of pregnancy: Secondary | ICD-10-CM | POA: Diagnosis not present

## 2023-07-28 DIAGNOSIS — O43193 Other malformation of placenta, third trimester: Secondary | ICD-10-CM

## 2023-07-28 DIAGNOSIS — O99820 Streptococcus B carrier state complicating pregnancy: Secondary | ICD-10-CM

## 2023-07-28 DIAGNOSIS — O10913 Unspecified pre-existing hypertension complicating pregnancy, third trimester: Secondary | ICD-10-CM

## 2023-07-28 DIAGNOSIS — O0993 Supervision of high risk pregnancy, unspecified, third trimester: Secondary | ICD-10-CM

## 2023-07-29 ENCOUNTER — Encounter (HOSPITAL_COMMUNITY): Payer: Self-pay

## 2023-07-29 ENCOUNTER — Other Ambulatory Visit: Payer: BC Managed Care – PPO

## 2023-07-29 ENCOUNTER — Telehealth (HOSPITAL_COMMUNITY): Payer: Self-pay | Admitting: *Deleted

## 2023-07-29 NOTE — Telephone Encounter (Signed)
Preadmission screen  

## 2023-07-30 ENCOUNTER — Encounter: Payer: Self-pay | Admitting: Certified Nurse Midwife

## 2023-07-30 DIAGNOSIS — O99820 Streptococcus B carrier state complicating pregnancy: Secondary | ICD-10-CM | POA: Insufficient documentation

## 2023-07-30 DIAGNOSIS — Z22330 Carrier of Group B streptococcus: Secondary | ICD-10-CM | POA: Insufficient documentation

## 2023-07-30 NOTE — Addendum Note (Signed)
Addended by: Edd Arbour on: 07/30/2023 09:12 PM   Modules accepted: Orders

## 2023-08-02 ENCOUNTER — Other Ambulatory Visit: Payer: BC Managed Care – PPO

## 2023-08-02 ENCOUNTER — Telehealth (HOSPITAL_COMMUNITY): Payer: Self-pay | Admitting: *Deleted

## 2023-08-02 NOTE — Telephone Encounter (Signed)
Preadmission screen  

## 2023-08-05 ENCOUNTER — Inpatient Hospital Stay (HOSPITAL_COMMUNITY): Payer: BC Managed Care – PPO | Admitting: Anesthesiology

## 2023-08-05 ENCOUNTER — Inpatient Hospital Stay (HOSPITAL_COMMUNITY): Payer: BC Managed Care – PPO

## 2023-08-05 ENCOUNTER — Encounter (HOSPITAL_COMMUNITY): Payer: Self-pay | Admitting: Family Medicine

## 2023-08-05 ENCOUNTER — Other Ambulatory Visit: Payer: Self-pay

## 2023-08-05 ENCOUNTER — Inpatient Hospital Stay (HOSPITAL_COMMUNITY)
Admission: RE | Admit: 2023-08-05 | Discharge: 2023-08-07 | DRG: 798 | Disposition: A | Discharge status: Home / Self Care | Payer: BC Managed Care – PPO | Attending: Family Medicine | Admitting: Family Medicine

## 2023-08-05 ENCOUNTER — Other Ambulatory Visit: Payer: BC Managed Care – PPO

## 2023-08-05 DIAGNOSIS — Q251 Coarctation of aorta: Secondary | ICD-10-CM

## 2023-08-05 DIAGNOSIS — O09529 Supervision of elderly multigravida, unspecified trimester: Secondary | ICD-10-CM

## 2023-08-05 DIAGNOSIS — O43193 Other malformation of placenta, third trimester: Secondary | ICD-10-CM | POA: Diagnosis present

## 2023-08-05 DIAGNOSIS — I10 Essential (primary) hypertension: Secondary | ICD-10-CM | POA: Diagnosis present

## 2023-08-05 DIAGNOSIS — Z8249 Family history of ischemic heart disease and other diseases of the circulatory system: Secondary | ICD-10-CM

## 2023-08-05 DIAGNOSIS — Z22330 Carrier of Group B streptococcus: Secondary | ICD-10-CM

## 2023-08-05 DIAGNOSIS — O1414 Severe pre-eclampsia complicating childbirth: Secondary | ICD-10-CM | POA: Diagnosis not present

## 2023-08-05 DIAGNOSIS — F411 Generalized anxiety disorder: Secondary | ICD-10-CM | POA: Diagnosis present

## 2023-08-05 DIAGNOSIS — Z23 Encounter for immunization: Secondary | ICD-10-CM

## 2023-08-05 DIAGNOSIS — Z3A39 39 weeks gestation of pregnancy: Secondary | ICD-10-CM

## 2023-08-05 DIAGNOSIS — O26893 Other specified pregnancy related conditions, third trimester: Secondary | ICD-10-CM | POA: Diagnosis present

## 2023-08-05 DIAGNOSIS — Z833 Family history of diabetes mellitus: Secondary | ICD-10-CM

## 2023-08-05 DIAGNOSIS — I359 Nonrheumatic aortic valve disorder, unspecified: Secondary | ICD-10-CM | POA: Diagnosis present

## 2023-08-05 DIAGNOSIS — Z6791 Unspecified blood type, Rh negative: Secondary | ICD-10-CM

## 2023-08-05 DIAGNOSIS — O4423 Partial placenta previa NOS or without hemorrhage, third trimester: Secondary | ICD-10-CM | POA: Diagnosis not present

## 2023-08-05 DIAGNOSIS — O43199 Other malformation of placenta, unspecified trimester: Secondary | ICD-10-CM | POA: Diagnosis present

## 2023-08-05 DIAGNOSIS — O114 Pre-existing hypertension with pre-eclampsia, complicating childbirth: Secondary | ICD-10-CM | POA: Diagnosis present

## 2023-08-05 DIAGNOSIS — O99820 Streptococcus B carrier state complicating pregnancy: Secondary | ICD-10-CM

## 2023-08-05 DIAGNOSIS — O99824 Streptococcus B carrier state complicating childbirth: Secondary | ICD-10-CM | POA: Diagnosis present

## 2023-08-05 DIAGNOSIS — O0990 Supervision of high risk pregnancy, unspecified, unspecified trimester: Secondary | ICD-10-CM

## 2023-08-05 DIAGNOSIS — Z79899 Other long term (current) drug therapy: Secondary | ICD-10-CM | POA: Diagnosis not present

## 2023-08-05 DIAGNOSIS — O10919 Unspecified pre-existing hypertension complicating pregnancy, unspecified trimester: Secondary | ICD-10-CM | POA: Diagnosis present

## 2023-08-05 DIAGNOSIS — O09523 Supervision of elderly multigravida, third trimester: Secondary | ICD-10-CM

## 2023-08-05 DIAGNOSIS — O1092 Unspecified pre-existing hypertension complicating childbirth: Secondary | ICD-10-CM | POA: Diagnosis present

## 2023-08-05 DIAGNOSIS — O0993 Supervision of high risk pregnancy, unspecified, third trimester: Principal | ICD-10-CM

## 2023-08-05 DIAGNOSIS — Z3A36 36 weeks gestation of pregnancy: Secondary | ICD-10-CM | POA: Diagnosis not present

## 2023-08-05 DIAGNOSIS — Z8679 Personal history of other diseases of the circulatory system: Secondary | ICD-10-CM | POA: Diagnosis not present

## 2023-08-05 DIAGNOSIS — K589 Irritable bowel syndrome without diarrhea: Secondary | ICD-10-CM | POA: Diagnosis present

## 2023-08-05 LAB — CBC
HCT: 33.6 % — ABNORMAL LOW (ref 36.0–46.0)
HCT: 35.5 % — ABNORMAL LOW (ref 36.0–46.0)
Hemoglobin: 11.1 g/dL — ABNORMAL LOW (ref 12.0–15.0)
Hemoglobin: 11.9 g/dL — ABNORMAL LOW (ref 12.0–15.0)
MCH: 29.8 pg (ref 26.0–34.0)
MCH: 30.4 pg (ref 26.0–34.0)
MCHC: 33 g/dL (ref 30.0–36.0)
MCHC: 33.5 g/dL (ref 30.0–36.0)
MCV: 90.3 fL (ref 80.0–100.0)
MCV: 90.6 fL (ref 80.0–100.0)
Platelets: 151 10*3/uL (ref 150–400)
Platelets: 152 10*3/uL (ref 150–400)
RBC: 3.72 MIL/uL — ABNORMAL LOW (ref 3.87–5.11)
RBC: 3.92 MIL/uL (ref 3.87–5.11)
RDW: 12.8 % (ref 11.5–15.5)
RDW: 12.9 % (ref 11.5–15.5)
WBC: 6.2 10*3/uL (ref 4.0–10.5)
WBC: 9.6 10*3/uL (ref 4.0–10.5)
nRBC: 0 % (ref 0.0–0.2)
nRBC: 0 % (ref 0.0–0.2)

## 2023-08-05 LAB — CBC WITH DIFFERENTIAL/PLATELET
Abs Immature Granulocytes: 0.02 10*3/uL (ref 0.00–0.07)
Basophils Absolute: 0 10*3/uL (ref 0.0–0.1)
Basophils Relative: 0 %
Eosinophils Absolute: 0.1 10*3/uL (ref 0.0–0.5)
Eosinophils Relative: 1 %
HCT: 35.4 % — ABNORMAL LOW (ref 36.0–46.0)
Hemoglobin: 11.9 g/dL — ABNORMAL LOW (ref 12.0–15.0)
Immature Granulocytes: 0 %
Lymphocytes Relative: 32 %
Lymphs Abs: 2.2 10*3/uL (ref 0.7–4.0)
MCH: 30.4 pg (ref 26.0–34.0)
MCHC: 33.6 g/dL (ref 30.0–36.0)
MCV: 90.3 fL (ref 80.0–100.0)
Monocytes Absolute: 0.7 10*3/uL (ref 0.1–1.0)
Monocytes Relative: 11 %
Neutro Abs: 3.7 10*3/uL (ref 1.7–7.7)
Neutrophils Relative %: 56 %
Platelets: 161 10*3/uL (ref 150–400)
RBC: 3.92 MIL/uL (ref 3.87–5.11)
RDW: 12.8 % (ref 11.5–15.5)
WBC: 6.7 10*3/uL (ref 4.0–10.5)
nRBC: 0 % (ref 0.0–0.2)

## 2023-08-05 LAB — COMPREHENSIVE METABOLIC PANEL
ALT: 12 U/L (ref 0–44)
AST: 17 U/L (ref 15–41)
Albumin: 2.5 g/dL — ABNORMAL LOW (ref 3.5–5.0)
Alkaline Phosphatase: 226 U/L — ABNORMAL HIGH (ref 38–126)
Anion gap: 9 (ref 5–15)
BUN: 10 mg/dL (ref 6–20)
CO2: 20 mmol/L — ABNORMAL LOW (ref 22–32)
Calcium: 9 mg/dL (ref 8.9–10.3)
Chloride: 104 mmol/L (ref 98–111)
Creatinine, Ser: 0.69 mg/dL (ref 0.44–1.00)
GFR, Estimated: >60 mL/min (ref >60)
Glucose, Bld: 96 mg/dL (ref 70–99)
Potassium: 3.4 mmol/L — ABNORMAL LOW (ref 3.5–5.1)
Sodium: 133 mmol/L — ABNORMAL LOW (ref 135–145)
Total Bilirubin: 0.4 mg/dL (ref <1.2)
Total Protein: 6 g/dL — ABNORMAL LOW (ref 6.5–8.1)

## 2023-08-05 LAB — PROTEIN / CREATININE RATIO, URINE
Creatinine, Urine: 209 mg/dL
Protein Creatinine Ratio: 0.13 mg/mg{creat} (ref 0.00–0.15)
Total Protein, Urine: 27 mg/dL

## 2023-08-05 LAB — TYPE AND SCREEN
ABO/RH(D): O NEG
Antibody Screen: NEGATIVE

## 2023-08-05 LAB — RPR: RPR Ser Ql: NONREACTIVE

## 2023-08-05 MED ORDER — MISOPROSTOL 50MCG HALF TABLET
50.0000 ug | ORAL_TABLET | Freq: Once | ORAL | Status: AC
  Administered 2023-08-05: 50 ug via ORAL
  Filled 2023-08-05: qty 1

## 2023-08-05 MED ORDER — LABETALOL HCL 100 MG PO TABS
100.0000 mg | ORAL_TABLET | Freq: Three times a day (TID) | ORAL | Status: DC
  Administered 2023-08-05: 100 mg via ORAL
  Filled 2023-08-05: qty 1

## 2023-08-05 MED ORDER — SODIUM CHLORIDE 0.9% FLUSH: 3.0000 mL | INTRAVENOUS | Status: DC | PRN

## 2023-08-05 MED ORDER — LACTATED RINGERS IV SOLN: 500.0000 mL | Freq: Once | INTRAVENOUS | Status: DC

## 2023-08-05 MED ORDER — MAGNESIUM SULFATE 40 GM/1000ML IV SOLN: 2.0000 g/h | INTRAVENOUS | Status: DC

## 2023-08-05 MED ORDER — LACTATED RINGERS IV SOLN: 500.0000 mL | INTRAVENOUS | Status: DC | PRN

## 2023-08-05 MED ORDER — ONDANSETRON HCL 4 MG/2ML IJ SOLN: 4.0000 mg | Freq: Four times a day (QID) | INTRAMUSCULAR | Status: DC | PRN

## 2023-08-05 MED ORDER — LABETALOL HCL 5 MG/ML IV SOLN: 80.0000 mg | INTRAVENOUS | Status: DC | PRN

## 2023-08-05 MED ORDER — OXYTOCIN-SODIUM CHLORIDE 30-0.9 UT/500ML-% IV SOLN
INTRAVENOUS | Status: AC
  Filled 2023-08-05: qty 500

## 2023-08-05 MED ORDER — OXYTOCIN BOLUS FROM INFUSION
333.0000 mL | Freq: Once | INTRAVENOUS | Status: AC
  Administered 2023-08-05: 333 mL via INTRAVENOUS

## 2023-08-05 MED ORDER — LACTATED RINGERS IV SOLN: INTRAVENOUS | Status: AC

## 2023-08-05 MED ORDER — LABETALOL HCL 100 MG PO TABS
100.0000 mg | ORAL_TABLET | Freq: Once | ORAL | Status: AC
  Administered 2023-08-05: 100 mg via ORAL
  Filled 2023-08-05: qty 1

## 2023-08-05 MED ORDER — MISOPROSTOL 25 MCG QUARTER TABLET
25.0000 ug | ORAL_TABLET | Freq: Once | ORAL | Status: AC
  Administered 2023-08-05: 25 ug via VAGINAL
  Filled 2023-08-05: qty 1

## 2023-08-05 MED ORDER — LABETALOL HCL 5 MG/ML IV SOLN
20.0000 mg | INTRAVENOUS | Status: DC | PRN
  Filled 2023-08-05: qty 4

## 2023-08-05 MED ORDER — ACETAMINOPHEN 325 MG PO TABS
650.0000 mg | ORAL_TABLET | ORAL | Status: DC | PRN
  Administered 2023-08-05: 650 mg via ORAL
  Filled 2023-08-05: qty 2

## 2023-08-05 MED ORDER — MISOPROSTOL 200 MCG PO TABS
800.0000 ug | ORAL_TABLET | Freq: Once | ORAL | Status: AC
  Administered 2023-08-05: 800 ug via BUCCAL
  Filled 2023-08-05: qty 4

## 2023-08-05 MED ORDER — EPHEDRINE 5 MG/ML INJ: 10.0000 mg | INTRAVENOUS | Status: DC | PRN

## 2023-08-05 MED ORDER — LIDOCAINE-EPINEPHRINE (PF) 2 %-1:200000 IJ SOLN
INTRAMUSCULAR | Status: DC | PRN
  Administered 2023-08-05: 5 mL via EPIDURAL

## 2023-08-05 MED ORDER — FAMOTIDINE IN NACL 20-0.9 MG/50ML-% IV SOLN
20.0000 mg | Freq: Once | INTRAVENOUS | Status: AC
  Administered 2023-08-05: 20 mg via INTRAVENOUS
  Filled 2023-08-05: qty 50

## 2023-08-05 MED ORDER — TRANEXAMIC ACID-NACL 1000-0.7 MG/100ML-% IV SOLN
INTRAVENOUS | Status: AC
  Filled 2023-08-05: qty 100

## 2023-08-05 MED ORDER — LACTATED RINGERS IV SOLN: INTRAVENOUS | Status: DC

## 2023-08-05 MED ORDER — LIDOCAINE HCL (PF) 1 % IJ SOLN: 30.0000 mL | INTRAMUSCULAR | Status: DC | PRN

## 2023-08-05 MED ORDER — FLEET ENEMA RE ENEM: 1.0000 | ENEMA | RECTAL | Status: DC | PRN

## 2023-08-05 MED ORDER — MAGNESIUM SULFATE BOLUS VIA INFUSION
4.0000 g | Freq: Once | INTRAVENOUS | Status: DC
Start: 2023-08-06 — End: 2023-08-06
  Filled 2023-08-05: qty 1000

## 2023-08-05 MED ORDER — TRANEXAMIC ACID-NACL 1000-0.7 MG/100ML-% IV SOLN
1000.0000 mg | INTRAVENOUS | Status: AC
  Administered 2023-08-05: 1000 mg via INTRAVENOUS

## 2023-08-05 MED ORDER — PHENYLEPHRINE 80 MCG/ML (10ML) SYRINGE FOR IV PUSH (FOR BLOOD PRESSURE SUPPORT): 80.0000 ug | PREFILLED_SYRINGE | INTRAVENOUS | Status: DC | PRN

## 2023-08-05 MED ORDER — SOD CITRATE-CITRIC ACID 500-334 MG/5ML PO SOLN: 30.0000 mL | ORAL | Status: DC | PRN

## 2023-08-05 MED ORDER — LABETALOL HCL 5 MG/ML IV SOLN
20.0000 mg | INTRAVENOUS | Status: DC | PRN
  Administered 2023-08-05: 20 mg via INTRAVENOUS

## 2023-08-05 MED ORDER — SODIUM CHLORIDE 0.9 % IV SOLN
5.0000 10*6.[IU] | Freq: Once | INTRAVENOUS | Status: AC
Start: 2023-08-05 — End: 2023-08-05
  Administered 2023-08-05: 5 10*6.[IU] via INTRAVENOUS
  Filled 2023-08-05: qty 5

## 2023-08-05 MED ORDER — OXYTOCIN-SODIUM CHLORIDE 30-0.9 UT/500ML-% IV SOLN: 2.5000 [IU]/h | INTRAVENOUS | Status: DC

## 2023-08-05 MED ORDER — AMLODIPINE BESYLATE 10 MG PO TABS
10.0000 mg | ORAL_TABLET | Freq: Every day | ORAL | Status: DC
  Administered 2023-08-06 – 2023-08-07 (×2): 10 mg via ORAL
  Filled 2023-08-05 (×2): qty 1
  Filled 2023-08-05 (×2): qty 2

## 2023-08-05 MED ORDER — FENTANYL CITRATE (PF) 100 MCG/2ML IJ SOLN: 50.0000 ug | INTRAMUSCULAR | Status: DC | PRN

## 2023-08-05 MED ORDER — HYDRALAZINE HCL 20 MG/ML IJ SOLN: 10.0000 mg | INTRAMUSCULAR | Status: DC | PRN

## 2023-08-05 MED ORDER — OXYCODONE-ACETAMINOPHEN 5-325 MG PO TABS: 2.0000 | ORAL_TABLET | ORAL | Status: DC | PRN

## 2023-08-05 MED ORDER — OXYCODONE-ACETAMINOPHEN 5-325 MG PO TABS: 1.0000 | ORAL_TABLET | ORAL | Status: DC | PRN

## 2023-08-05 MED ORDER — LABETALOL HCL 5 MG/ML IV SOLN: 40.0000 mg | INTRAVENOUS | Status: DC | PRN

## 2023-08-05 MED ORDER — DIPHENHYDRAMINE HCL 50 MG/ML IJ SOLN: 12.5000 mg | INTRAMUSCULAR | Status: DC | PRN

## 2023-08-05 MED ORDER — TERBUTALINE SULFATE 1 MG/ML IJ SOLN: 0.2500 mg | Freq: Once | INTRAMUSCULAR | Status: DC | PRN

## 2023-08-05 MED ORDER — LABETALOL HCL 200 MG PO TABS: 200.0000 mg | ORAL_TABLET | Freq: Two times a day (BID) | ORAL | Status: DC

## 2023-08-05 MED ORDER — PENICILLIN G POT IN DEXTROSE 60000 UNIT/ML IV SOLN
3.0000 10*6.[IU] | INTRAVENOUS | Status: DC
  Administered 2023-08-05 (×2): 3 10*6.[IU] via INTRAVENOUS
  Filled 2023-08-05 (×4): qty 50

## 2023-08-05 MED ORDER — FENTANYL-BUPIVACAINE-NACL 0.5-0.125-0.9 MG/250ML-% EP SOLN
12.0000 mL/h | EPIDURAL | Status: DC | PRN
  Administered 2023-08-05: 12 mL/h via EPIDURAL
  Filled 2023-08-05: qty 250

## 2023-08-05 MED ORDER — SODIUM CHLORIDE 0.9 % IV SOLN
3.0000 g | Freq: Once | INTRAVENOUS | Status: AC
  Administered 2023-08-05: 3 g via INTRAVENOUS
  Filled 2023-08-05: qty 8

## 2023-08-05 NOTE — Progress Notes (Signed)
Patient noted to have severe range blood pressures while waiting for pain relief from epidural. Patient rating pain 10/10. Will continue to monitor blood pressures in correlation to pain level. Patient asymptomatic of current blood pressure.

## 2023-08-05 NOTE — Discharge Summary (Signed)
Postpartum Discharge Summary  Date of Service updated 08/07/2023     Patient Name: Cassandra Hoover DOB: 1986-04-01 MRN: [id number]  Date of admission: 08/05/2023 Delivery date:08/05/2023 Delivering provider: Dorathy Kinsman Date of discharge: 08/07/2023  Admitting diagnosis: Indication for care in labor or delivery [O75.9] Intrauterine pregnancy: [redacted]w[redacted]d     Secondary diagnosis:  Principal Problem:   Indication for care in labor or delivery Active Problems:   Aortic valve disorder   Coarctation of aorta, congenital   Rh negative state in antepartum period   Chronic hypertension in pregnancy   Supervision of high-risk pregnancy   Advanced maternal age in multigravida   Generalized anxiety disorder   Irritable bowel syndrome   Marginal insertion of umbilical cord affecting management of mother   GBS (group B Streptococcus carrier), +RV culture, currently pregnant   Vaginal delivery  Additional problems: preeclampsia with severe features   Discharge diagnosis: Term Pregnancy Delivered and CHTN                                              Post partum procedures: none Augmentation: AROM and Cytotec Complications: None  Hospital course: Induction of Labor With Vaginal Delivery   37 y.o. yo G2P2002 at [redacted]w[redacted]d was admitted to the hospital 08/05/2023 for induction of labor.  Indication for induction:  cHTN, AMA .  Patient had an labor course complicated by  Membrane Rupture Time/Date: 12:35 PM,08/05/2023  Delivery Method:Vaginal, Spontaneous Operative Delivery:N/A Episiotomy: None Lacerations:  None Details of delivery can be found in separate delivery note.  Patient had a postpartum course complicated by preeclampsia with severe features. Patient is discharged home 08/07/2023  Newborn Data: Birth date:08/05/2023 Birth time:8:36 PM Gender:Female Living status:Living Apgars:8 ,9  Weight:2640 g  Magnesium Sulfate received: Yes: Seizure prophylaxis BMZ received:  No Rhophylac:Yes MMR:No T-DaP:Given prenatally Flu: Yes RSV Vaccine received: Yes Transfusion:No  Immunizations received: Immunization History  Administered Date(s) Administered   Influenza-Unspecified 05/26/2019, 04/08/2020, 06/03/2021, 06/24/2022, 05/06/2023   PFIZER Comirnaty(Gray Top)Covid-19 Tri-Sucrose Vaccine 05/23/2020   PFIZER(Purple Top)SARS-COV-2 Vaccination 10/26/2019, 11/15/2019   Pfizer Covid-19 Vaccine Bivalent Booster 74yrs & up 06/26/2021   Rsv, Bivalent, Protein Subunit Rsvpref,pf Verdis Frederickson) 07/21/2023   Tdap 04/05/2020, 05/19/2023    Physical exam  Vitals:   08/06/23 2300 08/07/23 0003 08/07/23 0627 08/07/23 0918  BP:  130/81 (!) 143/84 (!) 146/77  Pulse:  75 67 68  Resp: 18 16 18 16   Temp:  98 F (36.7 C) 98.1 F (36.7 C) 97.6 F (36.4 C)  TempSrc:  Oral Oral Oral  SpO2:  99% 100% 98%  Weight:      Height:       General: alert, cooperative, and no distress Lochia: appropriate Uterine Fundus: firm Incision: N/A DVT Evaluation: No evidence of DVT seen on physical exam. Labs: Lab Results  Component Value Date   WBC 11.2 (H) 08/06/2023   HGB 11.3 (L) 08/06/2023   HCT 34.0 (L) 08/06/2023   MCV 91.2 08/06/2023   PLT 152 08/06/2023      Latest Ref Rng & Units 08/06/2023    2:39 AM  CMP  Glucose 70 - 99 mg/dL 88   BUN 6 - 20 mg/dL 8   Creatinine 1.19 - 1.47 mg/dL 8.29   Sodium 562 - 130 mmol/L 131   Potassium 3.5 - 5.1 mmol/L 3.8   Chloride 98 - 111 mmol/L  101   CO2 22 - 32 mmol/L 19   Calcium 8.9 - 10.3 mg/dL 8.4   Total Protein 6.5 - 8.1 g/dL 5.4   Total Bilirubin <1.6 mg/dL 0.4   Alkaline Phos 38 - 126 U/L 225   AST 15 - 41 U/L 21   ALT 0 - 44 U/L 11    Edinburgh Score:    08/07/2023    3:00 AM  Edinburgh Postnatal Depression Scale Screening Tool  I have been able to laugh and see the funny side of things. 0  I have looked forward with enjoyment to things. 0  I have blamed myself unnecessarily when things went wrong. 0  I have  been anxious or worried for no good reason. 0  I have felt scared or panicky for no good reason. 0  Things have been getting on top of me. 0  I have been so unhappy that I have had difficulty sleeping. 0  I have felt sad or miserable. 0  I have been so unhappy that I have been crying. 1  The thought of harming myself has occurred to me. 0  Edinburgh Postnatal Depression Scale Total 1   Edinburgh Postnatal Depression Scale Total: 1   After visit meds:  Allergies as of 08/07/2023       Reactions   Sudafed [pseudoephedrine] Palpitations        Medication List     STOP taking these medications    aspirin EC 81 MG tablet   CHOLINE PO   D3-50 PO   labetalol 100 MG tablet Commonly known as: NORMODYNE   Loratadine 10 MG Caps   Mag-Oxide 200 MG Tabs Generic drug: Magnesium Oxide -Mg Supplement   mupirocin ointment 2 % Commonly known as: BACTROBAN   ONE A DAY PRENATAL PO   saline Gel       TAKE these medications    amLODipine 10 MG tablet Commonly known as: NORVASC Take 1 tablet (10 mg total) by mouth daily. Start taking on: August 08, 2023   Blood Pressure Monitor Automat Devi 1 Device by Does not apply route daily. Automatic blood pressure cuff regular size. To monitor blood pressure regularly at home. ICD-10 code: O09.90   furosemide 20 MG tablet Commonly known as: LASIX Take 1 tablet (20 mg total) by mouth daily. Start taking on: August 08, 2023   ibuprofen 600 MG tablet Commonly known as: ADVIL Take 1 tablet (600 mg total) by mouth every 6 (six) hours.   IRON PO Take by mouth.   PSYLLIUM HUSK PO Take by mouth.   sertraline 25 MG tablet Commonly known as: ZOLOFT Take 3 tablets (75 mg total) by mouth daily.         Discharge home in stable condition Infant Feeding: Breast Infant Disposition:home with mother Discharge instruction: per After Visit Summary and Postpartum booklet. Activity: Advance as tolerated. Pelvic rest for 6 weeks.   Diet: routine diet Future Appointments: Future Appointments  Date Time Provider Department Center  08/12/2023  2:30 PM [facility] NURSE [facility]  08/18/2023 10:00 AM Tobb, Kardie, DO [facility] None  08/24/2023  2:00 PM [address] [facility] None  09/08/2023  3:55 PM Bernerd Limbo, CNM [facility]   Follow up Visit:  Follow-up Information     [facility] Follow up in 5 day(s).   Specialty: Obstetrics and Gynecology Why: BP check Contact information: [address] [place] [address] [phone]  Message sent to [facility] 12/26  Please schedule this patient for a In person postpartum visit in 4 weeks with the following provider: Any provider. Additional Postpartum F/U:BP check 1 week  High risk pregnancy complicated by:  AMA, cHTN Delivery mode:  Vaginal, Spontaneous Anticipated Birth Control:  IUD   08/07/2023 Scheryl Darter, MD

## 2023-08-05 NOTE — Anesthesia Preprocedure Evaluation (Addendum)
Anesthesia Evaluation  Patient identified by MRN, date of birth, ID band Patient awake    Reviewed: Allergy & Precautions, NPO status , Patient's Chart, lab work & pertinent test results, reviewed documented beta blocker date and time   Airway Mallampati: II  TM Distance: >3 FB Neck ROM: Full    Dental no notable dental hx.    Pulmonary neg pulmonary ROS   Pulmonary exam normal breath sounds clear to auscultation       Cardiovascular hypertension, Pt. on medications and Pt. on home beta blockers Normal cardiovascular exam Rhythm:Regular Rate:Normal  Coarctation of aorta s/p repair as infant  TTE 2024 . Left ventricular ejection fraction by 3D volume is 50 %. The left  ventricle has low normal function. Left ventricular endocardial border not  optimally defined to evaluate regional wall motion. There is mild left  ventricular hypertrophy. Left  ventricular diastolic parameters were normal. There is abnormal septal  motion of undetermined etiology. The average left ventricular global  longitudinal strain is -17.0 %. The global longitudinal strain is  abnormal.   2. Right ventricular systolic function is normal. The right ventricular  size is normal. Mildly increased right ventricular wall thickness. There  is normal pulmonary artery systolic pressure. The estimated right  ventricular systolic pressure is 24.3  mmHg.   3. The mitral valve is grossly normal. Trivial mitral valve  regurgitation. No evidence of mitral stenosis.   4. Doming of the aortic valve in long axis, asymmetry of sinuses,  strongly suspect bicuspid aortic valve. 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 The aortic valve is bicuspid. Aortic valve  regurgitation is not visualized. Mild  aortic valve stenosis. Aortic valve area, by VTI measures 1.48 cm. Aortic  valve mean gradient measures 15.7 mmHg. Aortic valve Vmax measures 2.51  m/s.    5. Coarctation of the aorta. Narrowing of the proximal descending aorta  immediately distal to the left subclavian artery with flow acceleration,  with a Vmax of 2.23 m/s and a mean gradient of 12 mmHg, and a corrected  max instantaneous gradient of 13.4  mmHg. Aortic dilatation noted. There is mild dilatation of the ascending  aorta, measuring 39 mm.   6. The inferior vena cava is normal in size with greater than 50%  respiratory variability, suggesting right atrial pressure of 3 mmHg.     Neuro/Psych  PSYCHIATRIC DISORDERS Anxiety     negative neurological ROS     GI/Hepatic negative GI ROS, Neg liver ROS,,,  Endo/Other  negative endocrine ROS    Renal/GU negative Renal ROS  negative genitourinary   Musculoskeletal negative musculoskeletal ROS (+)    Abdominal   Peds  Hematology negative hematology ROS (+)   Anesthesia Other Findings   Reproductive/Obstetrics (+) Pregnancy                             Anesthesia Physical Anesthesia Plan  ASA: 3  Anesthesia Plan: Epidural   Post-op Pain Management:    Induction:   PONV Risk Score and Plan: Treatment may vary due to age or medical condition  Airway Management Planned: Natural Airway  Additional Equipment:   Intra-op Plan:   Post-operative Plan:   Informed Consent: I have reviewed the patients History and Physical, chart, labs and discussed the procedure including the risks, benefits and alternatives for the proposed anesthesia with the patient or authorized representative who has indicated his/her understanding and acceptance.  Plan Discussed with: Anesthesiologist  Anesthesia Plan Comments: (Patient identified. Risks, benefits, options discussed with patient including but not limited to bleeding, infection, nerve damage, paralysis, failed block, incomplete pain control, headache, blood pressure changes, nausea, vomiting, reactions to medication, itching, and post partum  back pain. Confirmed with bedside nurse the patient's most recent platelet count. Confirmed with the patient that they are not taking any anticoagulation, have any bleeding history or any family history of bleeding disorders. Patient expressed understanding and wishes to proceed. All questions were answered. )       Anesthesia Quick Evaluation

## 2023-08-05 NOTE — Progress Notes (Signed)
Patient blood pressures returned to normal with pain relief.

## 2023-08-05 NOTE — H&P (Signed)
LABOR AND DELIVERY ADMISSION HISTORY AND PHYSICAL NOTE  Cassandra Hoover is a 37 y.o. female G2P1001 with IUP at [redacted]w[redacted]d presenting for IOL 2/2 cHTN on labetalol, hx coarctation of aorta with repair as a child, RH neg, marginal cord insertion.   Patient reports the fetal movement as active. Patient reports uterine contraction  activity as regular, every 5 minutes. Patient reports  vaginal bleeding as none. Patient describes fluid per vagina as None.   Patient denies headache, vision changes, chest pain, shortness of breath, right upper quadrant pain, or LE edema.  She plans on breast feeding feeding. Her contraception plan is: IUD PP.  Prenatal History/Complications: PNC at [facility]  Sono:  @[redacted]w[redacted]d , CWD, normal anatomy, cephalic presentation, anterior placenta, 26%ile  Pregnancy complications:  Patient Active Problem List   Diagnosis Date Noted   Indication for care in labor or delivery 08/05/2023   GBS (group B Streptococcus carrier), +RV culture, currently pregnant 07/30/2023   Marginal insertion of umbilical cord affecting management of mother 07/01/2023   Irritable bowel syndrome 02/17/2023   Supervision of high-risk pregnancy 12/29/2022   Advanced maternal age in multigravida 12/29/2022   Generalized anxiety disorder 06/26/2022   Chronic hypertension in pregnancy 07/21/2021   Rh negative state in antepartum period 11/27/2019   Coarctation of aorta, congenital    Aortic valve disorder 04/27/2013    Past Medical History: Past Medical History:  Diagnosis Date   Coarctation of aorta 05/17/2013   Since childhood, Coarct 1.7cmX1.8cm   Coarctation of aorta, congenital    surgical repair at 74 days old and 13 years   Gestational hypertension, third trimester 06/05/2020   Obstetric vaginal laceration with second degree perineal laceration 06/06/2020   Rh negative state in antepartum period 11/27/2019    Past Surgical History: Past Surgical History:  Procedure Laterality Date   BREAST  ENHANCEMENT SURGERY     COARCTATION OF AORTA REPAIR     REFRACTIVE SURGERY     TONSILLECTOMY      Obstetrical History: OB History     Gravida  2   Para  1   Term  1   Preterm      AB      Living  1      SAB      IAB      Ectopic      Multiple  0   Live Births  1           Social History: Social History   Socioeconomic History   Marital status: Married    Spouse name: Not on file   Number of children: Not on file   Years of education: Not on file   Highest education level: Bachelor's degree (e.g., BA, AB, BS)  Occupational History   Not on file  Tobacco Use   Smoking status: Never   Smokeless tobacco: Never  Vaping Use   Vaping status: Never Used  Substance and Sexual Activity   Alcohol use: Not Currently    Alcohol/week: 3.0 - 4.0 standard drinks of alcohol    Types: 3 - 4 Glasses of wine per week    Comment: socially   Drug use: Never   Sexual activity: Yes  Other Topics Concern   Not on file  Social History Narrative   Not on file   Social Drivers of Health   Financial Resource Strain: Low Risk  (06/25/2022)   Received from [facility], [facility]   Overall Financial Resource Strain (CARDIA)    Difficulty  of Paying Living Expenses: Not hard at all  Food Insecurity: No Food Insecurity (07/21/2023)   Hunger Vital Sign    Worried About Running Out of Food in the Last Year: Never true    Ran Out of Food in the Last Year: Never true  Transportation Needs: No Transportation Needs (07/21/2023)   PRAPARE - Administrator, Civil Service (Medical): No    Lack of Transportation (Non-Medical): No  Physical Activity: Insufficiently Active (06/25/2022)   Received from [facility], [facility]   Exercise Vital Sign    Days of Exercise per Week: 2 days    Minutes of Exercise per Session: 40 min  Stress: Stress Concern Present (06/25/2022)   Received from [facility], [facility]  - Occupational Stress Questionnaire    Feeling of Stress : To some extent  Social Connections: Socially Integrated (08/18/2022)   Received from [facility], [facility]   Social Network    How would you rate your social network (family, work, friends)?: Good participation with social networks    Family History: Family History  Problem Relation Age of Onset   Hypertension Mother    Asthma Brother    Diabetes Maternal Grandmother    Heart failure Maternal Grandmother    Hypertension Paternal Grandmother    Heart disease Paternal Grandmother    Diabetes Paternal Grandmother    Cancer Neg Hx     Allergies: Allergies  Allergen Reactions   Sudafed [Pseudoephedrine] Palpitations    Medications Prior to Admission  Medication Sig Dispense Refill Last Dose/Taking   aspirin EC 81 MG tablet Take 1 tablet (81 mg total) by mouth daily. Take after 12 weeks for prevention of preeclampsia later in pregnancy 300 tablet 2    Blood Pressure Monitoring (BLOOD PRESSURE MONITOR AUTOMAT) DEVI 1 Device by Does not apply route daily. Automatic blood pressure cuff regular size. To monitor blood pressure regularly at home. ICD-10 code: O09.90 1 each 0    Cholecalciferol (D3-50 PO) Take 2,000 Units by mouth daily.       CHOLINE PO Take by mouth.      Ferrous Sulfate (IRON PO) Take by mouth.      labetalol (NORMODYNE) 100 MG tablet Take 0.5 tablets (50 mg total) by mouth 2 (two) times daily. 90 tablet 3    Loratadine 10 MG CAPS Take by mouth.       Magnesium Oxide (MAG-OXIDE) 200 MG TABS Take 2 tablets (400 mg total) by mouth at bedtime. If that amount causes loose stools in the am, switch to 200mg  daily at bedtime. (Patient taking differently: Take 400 mg by mouth at bedtime.) 60 tablet 3    mupirocin ointment (BACTROBAN) 2 % Apply 1 Application topically 2 (two) times daily. Apply as shown on end of finger twice daily for 10 days 22 g 0    Prenatal MV & Min w/FA-DHA (ONE A DAY PRENATAL PO) Take 1 tablet  by mouth daily.      PSYLLIUM HUSK PO Take by mouth.      saline (AYR) GEL Place 1 Application into both nostrils every 6 (six) hours. Apply using end of finger every 4-6 hours as needed for congestion and dryness 14 g 5    sertraline (ZOLOFT) 25 MG tablet Take 3 tablets (75 mg total) by mouth daily. 270 tablet 6      Review of Systems  All systems reviewed and negative except as stated in HPI  Physical Exam LMP 10/30/2022   Physical Exam  Presentation: cephalic  Fetal monitoring: Baseline: 130 bpm, Variability: Good {> 6 bpm), Accelerations: Reactive, and Decelerations: Absent Uterine activity: q49mins     Prenatal labs: ABO, Rh: O/Negative/-- (05/21 1348) Antibody: Negative (09/25 0904) Rubella: 1.88 (05/21 1348) RPR: Non Reactive (09/25 0904)  HBsAg: Negative (05/21 1348)  HIV: Non Reactive (09/25 0904)  GC/Chlamydia:  Neisseria Gonorrhea  Date Value Ref Range Status  07/21/2023 Negative  Final   Chlamydia  Date Value Ref Range Status  07/21/2023 Negative  Final   GBS: Positive/-- (12/11 0129)    Prenatal Transfer Tool  Maternal Diabetes: No Genetic Screening: Normal Maternal Ultrasounds/Referrals: Other: marginal cord Fetal Ultrasounds or other Referrals:  Fetal echo Maternal Substance Abuse:  No Significant Maternal Medications: ASA, labetalol Significant Maternal Lab Results: Group B Strep positive  No results found for this or any previous visit (from the past 24 hours).  Assessment: Cassandra Hoover is a 37 y.o. G2P1001 at [redacted]w[redacted]d here for IOL 2/2 cHTN, AMA  #Labor: Start with Dual Cytotec 50/25 #Pain: Per patient request  #FHT: Category I #GBS/ID: Positive > PCN #MOF: breast feeding #MOC: IUD #Circ: No  #Chronic HTN with elevated Bps on admit  #Bicuspid AV with slight AS on last maternal echo #Maternal hx of congenital aortic coarctation, s/p surgical repair: surgical repair at 10days of life and again at 37 years old, fetal echo [facility] at [facility] on ,  followed by Dr. Tawanna Cooler with OB Cardiology, primary Cardiologist Dr. Jodelle Red  - restarted on home Labetalol, but will increase to 200mg  BID - decreased ASA from 162mg  to 81mg  given nosebleeds  #AMA:  @[redacted]w[redacted]d , CWD, normal anatomy, cephalic presentation, anterior placenta, 26%ile  #GAD: not currently on Sertraline   #Marginal cord insertion: caution with extraction  #Rh negative: PP studies, s/p Rhogam    Mittie Bodo, MD Family Medicine - Obstetrics Fellow

## 2023-08-05 NOTE — Anesthesia Procedure Notes (Signed)
Epidural Patient location during procedure: OB Start time: 08/05/2023 3:55 PM End time: 08/05/2023 4:05 PM  Staffing Anesthesiologist: Elmer Picker, MD Performed: anesthesiologist   Preanesthetic Checklist Completed: patient identified, IV checked, risks and benefits discussed, monitors and equipment checked, pre-op evaluation and timeout performed  Epidural Patient position: sitting Prep: DuraPrep and site prepped and draped Patient monitoring: continuous pulse ox, blood pressure, heart rate and cardiac monitor Approach: midline Location: L3-L4 Injection technique: LOR air  Needle:  Needle type: Tuohy  Needle gauge: 17 G Needle length: 9 cm Needle insertion depth: 6 cm Catheter type: closed end flexible Catheter size: 19 Gauge Catheter at skin depth: 11 cm Test dose: negative  Assessment Sensory level: T8 Events: blood not aspirated, no cerebrospinal fluid, injection not painful, no injection resistance, no paresthesia and negative IV test  Additional Notes Patient identified. Risks/Benefits/Options discussed with patient including but not limited to bleeding, infection, nerve damage, paralysis, failed block, incomplete pain control, headache, blood pressure changes, nausea, vomiting, reactions to medication both or allergic, itching and postpartum back pain. Confirmed with bedside nurse the patient's most recent platelet count. Confirmed with patient that they are not currently taking any anticoagulation, have any bleeding history or any family history of bleeding disorders. Patient expressed understanding and wished to proceed. All questions were answered. Sterile technique was used throughout the entire procedure. Please see nursing notes for vital signs. Test dose was given through epidural catheter and negative prior to continuing to dose epidural or start infusion. Warning signs of high block given to the patient including shortness of breath, tingling/numbness in  hands, complete motor block, or any concerning symptoms with instructions to call for help. Patient was given instructions on fall risk and not to get out of bed. All questions and concerns addressed with instructions to call with any issues or inadequate analgesia.  Reason for block:procedure for pain

## 2023-08-05 NOTE — Progress Notes (Signed)
Cassandra Hoover is a 37 y.o. G2P1001 at [redacted]w[redacted]d.  Subjective: Comfortable w/ epidural. Took a long time to get get comfortable and was was in extreme pain when getting epidural.   Objective: BP 139/85   Pulse (!) 58   Temp 98.2 F (36.8 C) (Oral)   Resp 16   Ht 5\' 7"  (1.702 m)   Wt 90.4 kg   LMP 10/30/2022   BMI 31.20 kg/m   Patient Vitals for the past 24 hrs:  BP Temp Temp src Pulse Resp Height Weight  08/05/23 1801 (!) 149/83 -- -- (!) 59 -- -- --  08/05/23 1731 139/85 -- -- (!) 58 -- -- --  08/05/23 1701 139/84 -- -- (!) 59 -- -- --  08/05/23 1636 (!) 149/91 -- -- 61 -- -- --  08/05/23 1635 (!) 151/95 -- -- (!) 56 -- -- --  08/05/23 1633 (!) 153/94 -- -- (!) 58 -- -- --  08/05/23 1631 (!) 177/105 -- -- 67 -- -- --  08/05/23 1626 (!) 159/90 -- -- (!) 59 -- -- --  08/05/23 1620 (!) 175/105 -- -- 64 -- -- --  08/05/23 1616 (!) 172/100 -- -- 64 -- -- --  08/05/23 1612 (!) 157/102 -- -- 61 -- -- --  08/05/23 1606 (!) 179/100 -- -- 65 -- -- --  08/05/23 1602 (!) 172/96 -- -- 70 -- -- --  08/05/23 1600 (!) 160/98 -- -- 70 -- -- --  08/05/23 1413 (!) 151/88 98.2 F (36.8 C) Oral (!) 59 16 -- --   FHT:  FHR: 120 bpm, variability: mod,  accelerations:  15x15,  decelerations:  none UC:   Q 2-4 minutes, mod Dilation: 6 Effacement (%): 90 Station: -1 Presentation: Vertex Exam by:: H.Price, RN  Labs: Results for orders placed or performed during the hospital encounter of 08/05/23 (from the past 24 hours)  Protein / creatinine ratio, urine     Status: None   Collection Time: 08/05/23  7:24 AM  Result Value Ref Range   Creatinine, Urine 209 mg/dL   Total Protein, Urine 27 mg/dL   Protein Creatinine Ratio 0.13 0.00 - 0.15 mg/mg[Cre]  Type and screen     Status: None   Collection Time: 08/05/23  7:43 AM  Result Value Ref Range   ABO/RH(D) O NEG    Antibody Screen NEG    Sample Expiration      08/08/2023,2359 Performed at [facility], [address] [place], [place]  [address]   CBC     Status: Abnormal   Collection Time: 08/05/23  7:45 AM  Result Value Ref Range   WBC 6.2 4.0 - 10.5 K/uL   RBC 3.72 (L) 3.87 - 5.11 MIL/uL   Hemoglobin 11.1 (L) 12.0 - 15.0 g/dL   HCT 98.1 (L) 19.1 - 47.8 %   MCV 90.3 80.0 - 100.0 fL   MCH 29.8 26.0 - 34.0 pg   MCHC 33.0 30.0 - 36.0 g/dL   RDW 29.5 62.1 - 30.8 %   Platelets 152 150 - 400 K/uL   nRBC 0.0 0.0 - 0.2 %  RPR     Status: None   Collection Time: 08/05/23  7:45 AM  Result Value Ref Range   RPR Ser Ql NON REACTIVE NON REACTIVE  Comprehensive metabolic panel     Status: Abnormal   Collection Time: 08/05/23  7:45 AM  Result Value Ref Range   Sodium 133 (L) 135 - 145 mmol/L   Potassium 3.4 (L)  3.5 - 5.1 mmol/L   Chloride 104 98 - 111 mmol/L   CO2 20 (L) 22 - 32 mmol/L   Glucose, Bld 96 70 - 99 mg/dL   BUN 10 6 - 20 mg/dL   Creatinine, Ser 7.82 0.44 - 1.00 mg/dL   Calcium 9.0 8.9 - 95.6 mg/dL   Total Protein 6.0 (L) 6.5 - 8.1 g/dL   Albumin 2.5 (L) 3.5 - 5.0 g/dL   AST 17 15 - 41 U/L   ALT 12 0 - 44 U/L   Alkaline Phosphatase 226 (H) 38 - 126 U/L   Total Bilirubin 0.4 <1.2 mg/dL   GFR, Estimated >21 >30 mL/min   Anion gap 9 5 - 15  CBC with Differential/Platelet     Status: Abnormal   Collection Time: 08/05/23  3:15 PM  Result Value Ref Range   WBC 6.7 4.0 - 10.5 K/uL   RBC 3.92 3.87 - 5.11 MIL/uL   Hemoglobin 11.9 (L) 12.0 - 15.0 g/dL   HCT 86.5 (L) 78.4 - 69.6 %   MCV 90.3 80.0 - 100.0 fL   MCH 30.4 26.0 - 34.0 pg   MCHC 33.6 30.0 - 36.0 g/dL   RDW 29.5 28.4 - 13.2 %   Platelets 161 150 - 400 K/uL   nRBC 0.0 0.0 - 0.2 %   Neutrophils Relative % 56 %   Neutro Abs 3.7 1.7 - 7.7 K/uL   Lymphocytes Relative 32 %   Lymphs Abs 2.2 0.7 - 4.0 K/uL   Monocytes Relative 11 %   Monocytes Absolute 0.7 0.1 - 1.0 K/uL   Eosinophils Relative 1 %   Eosinophils Absolute 0.1 0.0 - 0.5 K/uL   Basophils Relative 0 %   Basophils Absolute 0.0 0.0 - 0.1 K/uL   Immature Granulocytes 0 %   Abs Immature  Granulocytes 0.02 0.00 - 0.07 K/uL    Assessment / Plan: [redacted]w[redacted]d week IUP ROM x Hours: 5 Minutes: 27 Labor: Active. Contracting and making adequate cervical change after AROM.  Fetal Wellbeing:  Category I Pain Control:  Epidural Anticipated MOD:  SVD CHTN: On PO Labetalol increased to 100 TID. Nml labs.  No Pre-E Sx. New severe-range BPs while pt in severe pain and getting epidural. Held off on treating w/ IV BP meds due to concern that epidural + HTN meds could drop BP suddenly too low. BP's returned to mild-range HTN when pain resolved. Will CTO closely and Tx w/ IV Labetalol if severe-range BPs return. Will also consider Dx of severe Pre-E and start Magnesium Sulfate. Dr. Jolayne Panther, attending aware.   Katrinka Blazing, [place], [place] 08/05/2023 6:02 PM

## 2023-08-05 NOTE — Progress Notes (Signed)
Cassandra Hoover is a 37 y.o. G2P1001 at [redacted]w[redacted]d.  Subjective: Mild cramping with contractions.   Objective: BP (!) 141/85   Pulse 70   Temp 98.1 F (36.7 C) (Oral)   Resp 16   Ht 5\' 7"  (1.702 m)   Wt 90.4 kg   LMP 10/30/2022   BMI 31.20 kg/m    FHT:  FHR: 125 bpm, variability: mod,  accelerations:  15x15,  decelerations:  none UC:   Q 2-4 minutes, mild Dilation: 3.5 Effacement (%): 50 Station: -2 Presentation: Vertex Exam by:: Ivonne Andrew, CNM AROM small amount of clear fluid  Labs: Results for orders placed or performed during the hospital encounter of 08/05/23 (from the past 24 hours)  Protein / creatinine ratio, urine     Status: None   Collection Time: 08/05/23  7:24 AM  Result Value Ref Range   Creatinine, Urine 209 mg/dL   Total Protein, Urine 27 mg/dL   Protein Creatinine Ratio 0.13 0.00 - 0.15 mg/mg[Cre]  Type and screen     Status: None   Collection Time: 08/05/23  7:43 AM  Result Value Ref Range   ABO/RH(D) O NEG    Antibody Screen NEG    Sample Expiration      08/08/2023,2359 Performed at [facility], [address] [place], [place] [address]   CBC     Status: Abnormal   Collection Time: 08/05/23  7:45 AM  Result Value Ref Range   WBC 6.2 4.0 - 10.5 K/uL   RBC 3.72 (L) 3.87 - 5.11 MIL/uL   Hemoglobin 11.1 (L) 12.0 - 15.0 g/dL   HCT 60.4 (L) 54.0 - 98.1 %   MCV 90.3 80.0 - 100.0 fL   MCH 29.8 26.0 - 34.0 pg   MCHC 33.0 30.0 - 36.0 g/dL   RDW 19.1 47.8 - 29.5 %   Platelets 152 150 - 400 K/uL   nRBC 0.0 0.0 - 0.2 %  RPR     Status: None   Collection Time: 08/05/23  7:45 AM  Result Value Ref Range   RPR Ser Ql NON REACTIVE NON REACTIVE  Comprehensive metabolic panel     Status: Abnormal   Collection Time: 08/05/23  7:45 AM  Result Value Ref Range   Sodium 133 (L) 135 - 145 mmol/L   Potassium 3.4 (L) 3.5 - 5.1 mmol/L   Chloride 104 98 - 111 mmol/L   CO2 20 (L) 22 - 32 mmol/L   Glucose, Bld 96 70 - 99 mg/dL   BUN 10 6 - 20 mg/dL   Creatinine,  Ser 6.21 0.44 - 1.00 mg/dL   Calcium 9.0 8.9 - 30.8 mg/dL   Total Protein 6.0 (L) 6.5 - 8.1 g/dL   Albumin 2.5 (L) 3.5 - 5.0 g/dL   AST 17 15 - 41 U/L   ALT 12 0 - 44 U/L   Alkaline Phosphatase 226 (H) 38 - 126 U/L   Total Bilirubin 0.4 <1.2 mg/dL   GFR, Estimated >65 >78 mL/min   Anion gap 9 5 - 15    Assessment / Plan: [redacted]w[redacted]d week IUP ROM x 0 hours Labor: Early, contracting frequently but mildly off of cytotec. Now AROM'd. Hopes to avoid pitocin. Will see how she progresses with current interventions.  Fetal Wellbeing:  Category I Pain Control:  Comfort measures. Planning epidural.  Anticipated MOD:  SVD CHTN: Nml Pre-E labs. No Sx. BP mildly elevated. Current dose in 100 BOD. Took morning dose. Will increase to 100 TID and  give dose now and adjust as needed.   Katrinka Blazing, [place], [place] 08/05/2023 12:23 PM

## 2023-08-06 ENCOUNTER — Encounter (HOSPITAL_COMMUNITY): Payer: Self-pay | Admitting: Family Medicine

## 2023-08-06 LAB — COMPREHENSIVE METABOLIC PANEL
ALT: 11 U/L (ref 0–44)
ALT: 11 U/L (ref 0–44)
AST: 21 U/L (ref 15–41)
AST: 22 U/L (ref 15–41)
Albumin: 2.2 g/dL — ABNORMAL LOW (ref 3.5–5.0)
Albumin: 2.4 g/dL — ABNORMAL LOW (ref 3.5–5.0)
Alkaline Phosphatase: 222 U/L — ABNORMAL HIGH (ref 38–126)
Alkaline Phosphatase: 225 U/L — ABNORMAL HIGH (ref 38–126)
Anion gap: 11 (ref 5–15)
Anion gap: 7 (ref 5–15)
BUN: 5 mg/dL — ABNORMAL LOW (ref 6–20)
BUN: 8 mg/dL (ref 6–20)
CO2: 19 mmol/L — ABNORMAL LOW (ref 22–32)
CO2: 22 mmol/L (ref 22–32)
Calcium: 8.4 mg/dL — ABNORMAL LOW (ref 8.9–10.3)
Calcium: 8.6 mg/dL — ABNORMAL LOW (ref 8.9–10.3)
Chloride: 101 mmol/L (ref 98–111)
Chloride: 102 mmol/L (ref 98–111)
Creatinine, Ser: 0.73 mg/dL (ref 0.44–1.00)
Creatinine, Ser: 0.82 mg/dL (ref 0.44–1.00)
GFR, Estimated: >60 mL/min (ref >60)
GFR, Estimated: >60 mL/min (ref >60)
Glucose, Bld: 88 mg/dL (ref 70–99)
Glucose, Bld: 88 mg/dL (ref 70–99)
Potassium: 3.8 mmol/L (ref 3.5–5.1)
Potassium: 4.1 mmol/L (ref 3.5–5.1)
Sodium: 131 mmol/L — ABNORMAL LOW (ref 135–145)
Sodium: 131 mmol/L — ABNORMAL LOW (ref 135–145)
Total Bilirubin: 0.4 mg/dL (ref <1.2)
Total Bilirubin: 0.4 mg/dL (ref <1.2)
Total Protein: 5.4 g/dL — ABNORMAL LOW (ref 6.5–8.1)
Total Protein: 6 g/dL — ABNORMAL LOW (ref 6.5–8.1)

## 2023-08-06 LAB — CBC
HCT: 34 % — ABNORMAL LOW (ref 36.0–46.0)
Hemoglobin: 11.3 g/dL — ABNORMAL LOW (ref 12.0–15.0)
MCH: 30.3 pg (ref 26.0–34.0)
MCHC: 33.2 g/dL (ref 30.0–36.0)
MCV: 91.2 fL (ref 80.0–100.0)
Platelets: 152 10*3/uL (ref 150–400)
RBC: 3.73 MIL/uL — ABNORMAL LOW (ref 3.87–5.11)
RDW: 12.6 % (ref 11.5–15.5)
WBC: 11.2 10*3/uL — ABNORMAL HIGH (ref 4.0–10.5)
nRBC: 0 % (ref 0.0–0.2)

## 2023-08-06 LAB — MAGNESIUM: Magnesium: 4.1 mg/dL — ABNORMAL HIGH (ref 1.7–2.4)

## 2023-08-06 MED ORDER — RHO D IMMUNE GLOBULIN 1500 UNIT/2ML IJ SOSY
300.0000 ug | PREFILLED_SYRINGE | Freq: Once | INTRAMUSCULAR | Status: AC
  Administered 2023-08-06: 300 ug via INTRAVENOUS
  Filled 2023-08-06: qty 2

## 2023-08-06 MED ORDER — SERTRALINE HCL 50 MG PO TABS
75.0000 mg | ORAL_TABLET | Freq: Every day | ORAL | Status: DC
  Administered 2023-08-06: 75 mg via ORAL
  Filled 2023-08-06: qty 2

## 2023-08-06 MED ORDER — ONDANSETRON HCL 4 MG/2ML IJ SOLN: 4.0000 mg | INTRAMUSCULAR | Status: DC | PRN

## 2023-08-06 MED ORDER — OXYCODONE-ACETAMINOPHEN 5-325 MG PO TABS: 1.0000 | ORAL_TABLET | ORAL | Status: DC | PRN

## 2023-08-06 MED ORDER — TETANUS-DIPHTH-ACELL PERTUSSIS 5-2.5-18.5 LF-MCG/0.5 IM SUSY: 0.5000 mL | PREFILLED_SYRINGE | Freq: Once | INTRAMUSCULAR | Status: DC

## 2023-08-06 MED ORDER — ONDANSETRON HCL 4 MG PO TABS: 4.0000 mg | ORAL_TABLET | ORAL | Status: DC | PRN

## 2023-08-06 MED ORDER — FUROSEMIDE 20 MG PO TABS
20.0000 mg | ORAL_TABLET | Freq: Every day | ORAL | Status: DC
  Administered 2023-08-06 – 2023-08-07 (×2): 20 mg via ORAL
  Filled 2023-08-06 (×2): qty 1

## 2023-08-06 MED ORDER — DIBUCAINE (PERIANAL) 1 % EX OINT: 1.0000 | TOPICAL_OINTMENT | CUTANEOUS | Status: DC | PRN

## 2023-08-06 MED ORDER — MAGNESIUM HYDROXIDE 400 MG/5ML PO SUSP: 30.0000 mL | ORAL | Status: DC | PRN

## 2023-08-06 MED ORDER — SIMETHICONE 80 MG PO CHEW: 80.0000 mg | CHEWABLE_TABLET | ORAL | Status: DC | PRN

## 2023-08-06 MED ORDER — ACETAMINOPHEN 325 MG PO TABS: 650.0000 mg | ORAL_TABLET | ORAL | Status: DC | PRN

## 2023-08-06 MED ORDER — MAGNESIUM SULFATE 40 GM/1000ML IV SOLN
2.0000 g/h | INTRAVENOUS | Status: AC
  Administered 2023-08-06 (×2): 2 g/h via INTRAVENOUS
  Filled 2023-08-06 (×2): qty 1000

## 2023-08-06 MED ORDER — MAGNESIUM SULFATE BOLUS VIA INFUSION
4.0000 g | Freq: Once | INTRAVENOUS | Status: AC
  Administered 2023-08-06: 4 g via INTRAVENOUS
  Filled 2023-08-06: qty 1000

## 2023-08-06 MED ORDER — COCONUT OIL OIL: 1.0000 | TOPICAL_OIL | Status: DC | PRN

## 2023-08-06 MED ORDER — BENZOCAINE-MENTHOL 20-0.5 % EX AERO
1.0000 | INHALATION_SPRAY | CUTANEOUS | Status: DC | PRN
  Administered 2023-08-06: 1 via TOPICAL
  Filled 2023-08-06: qty 56

## 2023-08-06 MED ORDER — WITCH HAZEL-GLYCERIN EX PADS
1.0000 | MEDICATED_PAD | CUTANEOUS | Status: DC | PRN
  Administered 2023-08-06: 1 via TOPICAL

## 2023-08-06 MED ORDER — IBUPROFEN 600 MG PO TABS
600.0000 mg | ORAL_TABLET | Freq: Four times a day (QID) | ORAL | Status: DC
  Administered 2023-08-06 – 2023-08-07 (×6): 600 mg via ORAL
  Filled 2023-08-06 (×6): qty 1

## 2023-08-06 MED ORDER — MEASLES, MUMPS & RUBELLA VAC IJ SOLR: 0.5000 mL | Freq: Once | INTRAMUSCULAR | Status: DC

## 2023-08-06 MED ORDER — OXYCODONE-ACETAMINOPHEN 5-325 MG PO TABS: 2.0000 | ORAL_TABLET | ORAL | Status: DC | PRN

## 2023-08-06 MED ORDER — DIPHENHYDRAMINE HCL 25 MG PO CAPS: 25.0000 mg | ORAL_CAPSULE | Freq: Four times a day (QID) | ORAL | Status: DC | PRN

## 2023-08-06 MED ORDER — PRENATAL MULTIVITAMIN CH
1.0000 | ORAL_TABLET | Freq: Every day | ORAL | Status: DC
  Administered 2023-08-06 – 2023-08-07 (×2): 1 via ORAL
  Filled 2023-08-06 (×2): qty 1

## 2023-08-06 NOTE — Progress Notes (Signed)
POSTPARTUM PROGRESS NOTE  PPD #1  Subjective:  Cassandra Hoover is a 37 y.o. [id number] s/p NSVD at [redacted]w[redacted]d complicated by [facility] with superimposed preE.  Today she notes she is doing great and feels better than yesterday. She denies any problems with ambulating, voiding or po intake. Denies nausea or vomiting. She has passed flatus, + BM.  Pain is well controlled.  Lochia minimal Denies fever/chills/chest pain/SOB.  no HA, no blurry vision, no RUQ pain  Objective: Blood pressure (!) 144/83, pulse 79, temperature 99.9 F (37.7 C), temperature source Oral, resp. rate 17, height 5\' 7"  (1.702 m), weight 90.4 kg, last menstrual period 10/30/2022, SpO2 100%, unknown if currently breastfeeding.  Physical Exam:  General: alert, cooperative and no distress Chest: no respiratory distress Heart: regular rate and rhythm Abdomen: soft, nontender Uterine Fundus: firm, appropriately tender DVT Evaluation: No calf swelling or tenderness Extremities: no edema Skin: warm, dry  Results for orders placed or performed during the hospital encounter of 08/05/23 (from the past 24 hours)  Protein / creatinine ratio, urine     Status: None   Collection Time: 08/05/23  7:24 AM  Result Value Ref Range   Creatinine, Urine 209 mg/dL   Total Protein, Urine 27 mg/dL   Protein Creatinine Ratio 0.13 0.00 - 0.15 mg/mg[Cre]  Type and screen     Status: None   Collection Time: 08/05/23  7:43 AM  Result Value Ref Range   ABO/RH(D) O NEG    Antibody Screen NEG    Sample Expiration      08/08/2023,2359 Performed at [facility], [address], [place], [place] [address]   CBC     Status: Abnormal   Collection Time: 08/05/23  7:45 AM  Result Value Ref Range   WBC 6.2 4.0 - 10.5 K/uL   RBC 3.72 (L) 3.87 - 5.11 MIL/uL   Hemoglobin 11.1 (L) 12.0 - 15.0 g/dL   HCT 81.1 (L) 91.4 - 78.2 %   MCV 90.3 80.0 - 100.0 fL   MCH 29.8 26.0 - 34.0 pg   MCHC 33.0 30.0 - 36.0 g/dL   RDW 95.6 21.3 - 08.6 %   Platelets 152 150 -  400 K/uL   nRBC 0.0 0.0 - 0.2 %  RPR     Status: None   Collection Time: 08/05/23  7:45 AM  Result Value Ref Range   RPR Ser Ql NON REACTIVE NON REACTIVE  Comprehensive metabolic panel     Status: Abnormal   Collection Time: 08/05/23  7:45 AM  Result Value Ref Range   Sodium 133 (L) 135 - 145 mmol/L   Potassium 3.4 (L) 3.5 - 5.1 mmol/L   Chloride 104 98 - 111 mmol/L   CO2 20 (L) 22 - 32 mmol/L   Glucose, Bld 96 70 - 99 mg/dL   BUN 10 6 - 20 mg/dL   Creatinine, Ser 5.78 0.44 - 1.00 mg/dL   Calcium 9.0 8.9 - 46.9 mg/dL   Total Protein 6.0 (L) 6.5 - 8.1 g/dL   Albumin 2.5 (L) 3.5 - 5.0 g/dL   AST 17 15 - 41 U/L   ALT 12 0 - 44 U/L   Alkaline Phosphatase 226 (H) 38 - 126 U/L   Total Bilirubin 0.4 <1.2 mg/dL   GFR, Estimated >62 >95 mL/min   Anion gap 9 5 - 15  CBC with Differential/Platelet     Status: Abnormal   Collection Time: 08/05/23  3:15 PM  Result Value Ref Range  WBC 6.7 4.0 - 10.5 K/uL   RBC 3.92 3.87 - 5.11 MIL/uL   Hemoglobin 11.9 (L) 12.0 - 15.0 g/dL   HCT 16.1 (L) 09.6 - 04.5 %   MCV 90.3 80.0 - 100.0 fL   MCH 30.4 26.0 - 34.0 pg   MCHC 33.6 30.0 - 36.0 g/dL   RDW 40.9 81.1 - 91.4 %   Platelets 161 150 - 400 K/uL   nRBC 0.0 0.0 - 0.2 %   Neutrophils Relative % 56 %   Neutro Abs 3.7 1.7 - 7.7 K/uL   Lymphocytes Relative 32 %   Lymphs Abs 2.2 0.7 - 4.0 K/uL   Monocytes Relative 11 %   Monocytes Absolute 0.7 0.1 - 1.0 K/uL   Eosinophils Relative 1 %   Eosinophils Absolute 0.1 0.0 - 0.5 K/uL   Basophils Relative 0 %   Basophils Absolute 0.0 0.0 - 0.1 K/uL   Immature Granulocytes 0 %   Abs Immature Granulocytes 0.02 0.00 - 0.07 K/uL  Comprehensive metabolic panel     Status: Abnormal   Collection Time: 08/05/23 11:39 PM  Result Value Ref Range   Sodium 131 (L) 135 - 145 mmol/L   Potassium 4.1 3.5 - 5.1 mmol/L   Chloride 102 98 - 111 mmol/L   CO2 22 22 - 32 mmol/L   Glucose, Bld 88 70 - 99 mg/dL   BUN 5 (L) 6 - 20 mg/dL   Creatinine, Ser 7.82 0.44 -  1.00 mg/dL   Calcium 8.6 (L) 8.9 - 10.3 mg/dL   Total Protein 6.0 (L) 6.5 - 8.1 g/dL   Albumin 2.4 (L) 3.5 - 5.0 g/dL   AST 22 15 - 41 U/L   ALT 11 0 - 44 U/L   Alkaline Phosphatase 222 (H) 38 - 126 U/L   Total Bilirubin 0.4 <1.2 mg/dL   GFR, Estimated >95 >62 mL/min   Anion gap 7 5 - 15  CBC     Status: Abnormal   Collection Time: 08/05/23 11:39 PM  Result Value Ref Range   WBC 9.6 4.0 - 10.5 K/uL   RBC 3.92 3.87 - 5.11 MIL/uL   Hemoglobin 11.9 (L) 12.0 - 15.0 g/dL   HCT 13.0 (L) 86.5 - 78.4 %   MCV 90.6 80.0 - 100.0 fL   MCH 30.4 26.0 - 34.0 pg   MCHC 33.5 30.0 - 36.0 g/dL   RDW 69.6 29.5 - 28.4 %   Platelets 151 150 - 400 K/uL   nRBC 0.0 0.0 - 0.2 %  CBC     Status: Abnormal   Collection Time: 08/06/23  2:39 AM  Result Value Ref Range   WBC 11.2 (H) 4.0 - 10.5 K/uL   RBC 3.73 (L) 3.87 - 5.11 MIL/uL   Hemoglobin 11.3 (L) 12.0 - 15.0 g/dL   HCT 13.2 (L) 44.0 - 10.2 %   MCV 91.2 80.0 - 100.0 fL   MCH 30.3 26.0 - 34.0 pg   MCHC 33.2 30.0 - 36.0 g/dL   RDW 72.5 36.6 - 44.0 %   Platelets 152 150 - 400 K/uL   nRBC 0.0 0.0 - 0.2 %  Comprehensive metabolic panel     Status: Abnormal   Collection Time: 08/06/23  2:39 AM  Result Value Ref Range   Sodium 131 (L) 135 - 145 mmol/L   Potassium 3.8 3.5 - 5.1 mmol/L   Chloride 101 98 - 111 mmol/L   CO2 19 (L) 22 - 32 mmol/L   Glucose, Bld 88  70 - 99 mg/dL   BUN 8 6 - 20 mg/dL   Creatinine, Ser 1.61 0.44 - 1.00 mg/dL   Calcium 8.4 (L) 8.9 - 10.3 mg/dL   Total Protein 5.4 (L) 6.5 - 8.1 g/dL   Albumin 2.2 (L) 3.5 - 5.0 g/dL   AST 21 15 - 41 U/L   ALT 11 0 - 44 U/L   Alkaline Phosphatase 225 (H) 38 - 126 U/L   Total Bilirubin 0.4 <1.2 mg/dL   GFR, Estimated >09 >60 mL/min   Anion gap 11 5 - 15  Magnesium     Status: Abnormal   Collection Time: 08/06/23  2:39 AM  Result Value Ref Range   Magnesium 4.1 (H) 1.7 - 2.4 mg/dL    Assessment/Plan: Cassandra Hoover is a 37 y.o. [id number] s/p NSVD at [redacted]w[redacted]d PPD#1 complicated by: 1) cHTN  with superimposed preE -plan for Mag x 24hr -Norvasc 5mg  daily and Lasix- may need to titrate today.  Will continue to closely monitor  2) postpartum care Pain well controlled Meeting milestones appropriately  Contraception: outpt IUD Feeding: breast  Dispo: Postpartum care as outlined above   LOS: 1 day   Myna Hidalgo, DO Faculty Attending, Center for [facility] 08/06/2023, 7:14 AM

## 2023-08-06 NOTE — Lactation Note (Signed)
This note was copied from a baby's chart. Lactation Consultation Note  Patient Name: Boy Suelynn Quirke ZHYQM'V Date: 08/06/2023 Age:35 hours  Per RNMaralyn Sago) in [facility] declined [facility] services during hospital stay.    Maternal Data    Feeding    LATCH Score                    Lactation Tools Discussed/Used    Interventions    Discharge    Consult Status      Frederico Hamman 08/06/2023, 1:05 AM

## 2023-08-06 NOTE — Anesthesia Postprocedure Evaluation (Signed)
Anesthesia Post Note  Patient: Cassandra Hoover  Procedure(s) Performed: AN AD HOC LABOR EPIDURAL     Patient location during evaluation: Mother Baby Anesthesia Type: Epidural Level of consciousness: awake and alert and oriented Pain management: satisfactory to patient Vital Signs Assessment: post-procedure vital signs reviewed and stable Respiratory status: respiratory function stable Cardiovascular status: stable Postop Assessment: no headache, no backache, epidural receding, patient able to bend at knees, no signs of nausea or vomiting and adequate PO intake Anesthetic complications: no   No notable events documented.  Last Vitals:  Vitals:   08/06/23 0700 08/06/23 0728  BP: (!) 144/83   Pulse: 79   Resp: 17 18  Temp:    SpO2:      Last Pain:  Vitals:   08/06/23 0728  TempSrc:   PainSc: 0-No pain   Pain Goal:                   Molli Hazard

## 2023-08-07 LAB — RH IG WORKUP (INCLUDES ABO/RH)
Fetal Screen: NEGATIVE
Gestational Age(Wks): 39
Unit division: 0

## 2023-08-07 MED ORDER — FUROSEMIDE 20 MG PO TABS: 20.0000 mg | ORAL_TABLET | Freq: Every day | ORAL | 0 refills | Status: DC

## 2023-08-07 MED ORDER — AMLODIPINE BESYLATE 10 MG PO TABS: 10.0000 mg | ORAL_TABLET | Freq: Every day | ORAL | 1 refills | Status: DC

## 2023-08-07 MED ORDER — IBUPROFEN 600 MG PO TABS: 600.0000 mg | ORAL_TABLET | Freq: Four times a day (QID) | ORAL | 0 refills | Status: DC

## 2023-08-08 ENCOUNTER — Encounter (HOSPITAL_COMMUNITY): Payer: Self-pay | Admitting: Obstetrics & Gynecology

## 2023-08-08 ENCOUNTER — Inpatient Hospital Stay (HOSPITAL_COMMUNITY)
Admission: AD | Admit: 2023-08-08 | Discharge: 2023-08-08 | Disposition: A | Discharge status: Home / Self Care | Payer: BC Managed Care – PPO | Attending: Obstetrics & Gynecology | Admitting: Obstetrics & Gynecology

## 2023-08-08 DIAGNOSIS — I10 Essential (primary) hypertension: Secondary | ICD-10-CM | POA: Diagnosis not present

## 2023-08-08 DIAGNOSIS — N939 Abnormal uterine and vaginal bleeding, unspecified: Secondary | ICD-10-CM | POA: Insufficient documentation

## 2023-08-08 HISTORY — DX: Anemia, unspecified: D64.9

## 2023-08-08 HISTORY — DX: Urinary tract infection, site not specified: N39.0

## 2023-08-08 HISTORY — DX: Anxiety disorder, unspecified: F41.9

## 2023-08-08 NOTE — MAU Note (Signed)
Cassandra Hoover is a 36 y.o. at Unknown here in MAU reporting: has had high BP through out preg.  Has CHTN. Was high last night.  Is currently on meds.  Thought she was going to have a BM, ended up being a fist sized clot, not something white on it about the size of a pumpkin seed.  Bleeding had been light , has not changed, - only needs to change like every 6 hrs.  Since then noted one pea sized clot when used the restroom.  Denies HA,visual changes, epigastric pain, no real swelling.   Onset of complaint: last night/today Pain score: mild- tender hemorrhoids Vitals:   08/08/23 1717  BP: 137/89  Pulse: 84  Resp: 16  Temp: 99.5 F (37.5 C)  SpO2: 97%      Lab orders placed from triage:

## 2023-08-08 NOTE — MAU Provider Note (Cosign Needed)
Chief Complaint:  Vaginal Bleeding and Hypertension   Event Date/Time   First Provider Initiated Contact with Patient 08/08/23 1804     HPI  HPI: Cassandra Hoover is a 37 y.o. G2P2002 at 3 days postpartum after vaginal delivery at [redacted]w[redacted]d who presents to maternity admissions reporting elevated BP and passage of fist sized blood clot. Currently changing pads every 6 hours, one pea sized clot since.  Of note has CHTN on Norvasc 5mg  daily and Lasix 20 mg daily x5 days; compliant.  She reports no vaginal itching/burning, urinary symptoms, h/a, dizziness, n/v, diarrhea, constipation or fever/chills.  She denies headache, visual changes or RUQ abdominal pain.   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   Gestational hypertension, third trimester 06/05/2020   Obstetric vaginal laceration with second degree perineal laceration 06/06/2020   Rh negative state in antepartum period 11/27/2019   UTI (urinary tract infection)     Past obstetric history: OB History  Gravida Para Term Preterm AB Living  2 2 2   2   SAB IAB Ectopic Multiple Live Births     0 2    # Outcome Date GA Lbr Len/2nd Weight Sex Type Anes PTL Lv  2 Term 08/05/23 [redacted]w[redacted]d 07:09 / 00:52 2640 g M Vag-Spont EPI  LIV  1 Term 06/06/20 [redacted]w[redacted]d 07:47 / 00:44 2656 g F Vag-Spont EPI  LIV     Birth Comments: wnl    Past Surgical History: Past Surgical History:  Procedure Laterality Date   BREAST ENHANCEMENT SURGERY     COARCTATION OF AORTA REPAIR     REFRACTIVE SURGERY     TONSILLECTOMY      Family History: Family History  Problem Relation Age of Onset   Healthy Mother    Hypertension Father    Asthma Brother    Diabetes Maternal Grandmother    Heart failure Maternal Grandmother    Hypertension Paternal Grandmother    Heart disease  Paternal Grandmother    Diabetes Paternal Grandmother    Cancer Neg Hx     Social History: Social History   Tobacco Use   Smoking status: Never   Smokeless tobacco: Never  Vaping Use   Vaping status: Never Used  Substance Use Topics   Alcohol use: Not Currently    Alcohol/week: 3.0 - 4.0 standard drinks of alcohol    Types: 3 - 4 Glasses of wine per week    Comment: socially   Drug use: Never    Allergies:  Allergies  Allergen Reactions   Sudafed [Pseudoephedrine] Palpitations    Meds:  Medications Prior to Admission  Medication Sig Dispense Refill Last Dose/Taking   acetaminophen (TYLENOL) 500 MG tablet Take 500 mg by mouth every 6 (six) hours as needed.   08/08/2023   amLODipine (NORVASC) 10 MG tablet Take 1 tablet (10 mg total) by mouth daily. 30 tablet 1 08/08/2023   Ferrous Sulfate (IRON PO) Take by mouth.   08/08/2023   furosemide (LASIX) 20 MG tablet Take 1 tablet (20 mg total) by mouth daily. 5 tablet 0 08/08/2023 at 11:00 AM   ibuprofen (ADVIL) 600 MG tablet Take 1 tablet (600 mg total) by mouth every 6 (six) hours. 30 tablet 0 Past Week   PSYLLIUM HUSK PO Take by mouth.  Past Week   sertraline (ZOLOFT) 25 MG tablet Take 3 tablets (75 mg total) by mouth daily. 270 tablet 6 08/08/2023   Blood Pressure Monitoring (BLOOD PRESSURE MONITOR AUTOMAT) DEVI 1 Device by Does not apply route daily. Automatic blood pressure cuff regular size. To monitor blood pressure regularly at home. ICD-10 code: O09.90 1 each 0     I have reviewed patient's Past Medical Hx, Surgical Hx, Family Hx, Social Hx, medications and allergies.   ROS:  Review of Systems Other systems negative  Physical Exam  Patient Vitals for the past 24 hrs:  BP Temp Temp src Pulse Resp SpO2  08/08/23 1815 (!) 140/91 -- -- 81 -- --  08/08/23 1801 (!) 137/91 -- -- 82 -- --  08/08/23 1746 (!) 142/95 -- -- 83 -- --  08/08/23 1735 (!) 130/90 -- -- 90 -- 99 %  08/08/23 1717 137/89 99.5 F (37.5 C) Oral 84  16 97 %   Constitutional: Well-developed, well-nourished female in no acute distress.  Cardiovascular: normal rate and rhythm Respiratory: normal effort, clear to auscultation bilaterally GI: Abd soft, non-tender, gravid appropriate for gestational age.   No rebound or guarding. MS: Extremities nontender, no edema, normal ROM Neurologic: Alert and oriented x 4.  GU: Neg CVAT.  Labs: No results found for this or any previous visit (from the past 24 hours). --/--/O NEG (12/26 1610)   MAU Course/MDM: I have reviewed the triage vital signs and the nursing notes.   Pertinent labs & imaging results that were available during my care of the patient were reviewed by me and considered in my medical decision making (see chart for details).      I have reviewed her medical records including past results, notes and treatments.   I have ordered labs and reviewed results.   Treatments in MAU included serial BP.    Assessment: 1. Postpartum state   2. Vaginal bleeding   3. Chronic hypertension    Single episode of dark red clot at home, no bleeding since. No pain. No discharge. No odor.  - suspect normal lochia after prolonged recumbent period  CHTN - Mild range pressures 140s/90s on 10 mg Amlodipine, and 20 mg Lasix. Asymptomatic - Enrolled and participating in [employer] - Followed by [facility] - BP Follow up 1/2  Plan: Discharge home Return precautions provided Follow up in Office for postpartum check   Pt stable at time of discharge.  Wyn Forster, MD FMOB Fellow, Faculty practice [facility], Center for [facility]  08/08/2023 6:32 PM

## 2023-08-09 ENCOUNTER — Other Ambulatory Visit (INDEPENDENT_AMBULATORY_CARE_PROVIDER_SITE_OTHER): Payer: Self-pay | Admitting: Obstetrics & Gynecology

## 2023-08-10 MED ORDER — LABETALOL HCL 100 MG PO TABS: 100.0000 mg | ORAL_TABLET | Freq: Two times a day (BID) | ORAL | 3 refills | Status: DC

## 2023-08-10 NOTE — Telephone Encounter (Signed)
Called patient and made her aware that per Dr. Servando Salina to start Labetalol 100 mg twice a day. Advised patient to continue taking blood pressure and send via my chart. Patient verbalized an understanding.

## 2023-08-12 ENCOUNTER — Other Ambulatory Visit: Payer: Self-pay

## 2023-08-12 ENCOUNTER — Ambulatory Visit: Payer: BC Managed Care – PPO

## 2023-08-12 VITALS — BP 146/98 | HR 82 | Ht 67.0 in | Wt 187.4 lb

## 2023-08-12 DIAGNOSIS — Z0130 Encounter for examination of blood pressure without abnormal findings: Secondary | ICD-10-CM

## 2023-08-12 NOTE — Progress Notes (Signed)
 Pt here today for BP check.  Pt reports last dose of Norvasc  10 mg  and Labetalol  at 1030a.  Pt denies visual changes and headache.   Pt states that she started taking the Labetalol  100 mg po bid on Tuesday PP visit on 09/08/23.  BP LA 149/91.  Rpt BP RA 146/98 manually.  Reviewed chart with Cresenzo, MD who recommends that increase her Labetalol  100 mg po bid to Labetalol  200 mg po bid.  Pt reports that she has enough on hand to not need an Rx at this time.  Pt advised that she will have f/u with Dr. Sheena on 08/18/23, to continue to monitor for sx's of elevated BP, and to contact the office with questions or concerns.  Pt verbalized understanding with no further questions.   Donika Butner,RN  08/17/23

## 2023-08-18 ENCOUNTER — Ambulatory Visit: Payer: BC Managed Care – PPO | Attending: Cardiology | Admitting: Cardiology

## 2023-08-18 ENCOUNTER — Encounter: Payer: Self-pay | Admitting: Cardiology

## 2023-08-18 VITALS — BP 128/83 | HR 79 | Ht 68.0 in | Wt 183.0 lb

## 2023-08-18 DIAGNOSIS — O1495 Unspecified pre-eclampsia, complicating the puerperium: Secondary | ICD-10-CM

## 2023-08-18 DIAGNOSIS — O165 Unspecified maternal hypertension, complicating the puerperium: Secondary | ICD-10-CM

## 2023-08-18 DIAGNOSIS — O99893 Other specified diseases and conditions complicating puerperium: Secondary | ICD-10-CM | POA: Diagnosis not present

## 2023-08-18 DIAGNOSIS — Z3A00 Weeks of gestation of pregnancy not specified: Secondary | ICD-10-CM | POA: Diagnosis not present

## 2023-08-18 DIAGNOSIS — Q251 Coarctation of aorta: Secondary | ICD-10-CM

## 2023-08-18 DIAGNOSIS — Z8774 Personal history of (corrected) congenital malformations of heart and circulatory system: Secondary | ICD-10-CM | POA: Diagnosis not present

## 2023-08-18 DIAGNOSIS — O09292 Supervision of pregnancy with other poor reproductive or obstetric history, second trimester: Secondary | ICD-10-CM

## 2023-08-18 NOTE — Progress Notes (Signed)
 Cardio-Obstetrics Clinic  New Evaluation  Date:  08/18/2023   ID:  Cassandra Hoover, DOB May 29, 1986, MRN [id number]  PCP:  Rena Luke POUR, MD   [facility] Providers Cardiologist:  Shelda Bruckner, MD  Electrophysiologist:  None       Referring MD: Rena Luke POUR, MD   Chief Complaint:  I am doing well   History of Present Illness:    Cassandra Hoover is a 38 y.o. female [G2P2002] with hx of includes coarctation of the aorta follows cardiovascular care in pregnancy.  Hx of hypertensive disorder in pregnancy, coarctation of the aorta, preeclampsia here today for follow-up visit.  She is postpartum.  She is doing well.  On discharge postdelivery she was started on amlodipine  10 mg daily.  But while at home blood pressure continue to remain elevated.  Patient MyChart message with elevated blood pressure led to addition of labetalol  100 mg twice daily.  She tells me that this is helping.  No complaints today.    Prior CV Studies Reviewed: The following studies were reviewed today: Cardiac MR 09/2019 CONTRAST:  Hjjijcpdu   FINDINGS: Normal LA/RA size. Normal RV size and function No ASD/PFO/VSD. No pericardial effusion normal MV,TV. Aortic valve is tri-leaflet with no significant stenosis or regurgitation. Normal LV size and function. Quantitative EF 59% (EDV 114 cc ESV 47 cc SV 68 cc) Septal thickness normal 8 mm no LVH. Delayed gadolinium images showed no hyper-enhancement of the LV myocardium   Aorta: There is fusiform relative dilatation of the ascending aortic root. The great vessels exit the arch normally The arch itself is relatively hypoplastic. Just distal to the left subclavian take off there is residual pseudo coarctation at the likely sight of previous repair. This area narrows down to a diameter of 13 mm. Flow analysis through this area precisely was not performed   Aortic Sinus: 28 mm   STJ 25 mm   Aortic Root: 34 mm   Aortic Arch 18 mm    Pseudo Coarctation: 13 mm just distal to left subclavian take off   Proximal descending thoracic aorta 24 mm   Distal descending thoracic aorta 17 mm   IMPRESSION: 1. Normal tri- leaflet aortic valve   2. Relative dilatation of the ascending aortic root at 34 mm with hypoplastic arch 17 mm. Residual pseudo-coarctation likely at site of previous repair with residual minimal luminal diameter of 13 mm. Flow analysis through the smallest area not performed   3.  Normal LV size and function EF 59%   4.  No delayed LV myocardial enhancement post gadolinium  Past Medical History:  Diagnosis Date   Anemia    Anxiety    Coarctation of aorta 05/17/2013   Since childhood, Coarct 1.7cmX1.8cm   Coarctation of aorta, congenital    surgical repair at 28 days old and 13 years   Gestational hypertension, third trimester 06/05/2020   Obstetric vaginal laceration with second degree perineal laceration 06/06/2020   Rh negative state in antepartum period 11/27/2019   UTI (urinary tract infection)     Past Surgical History:  Procedure Laterality Date   BREAST ENHANCEMENT SURGERY     COARCTATION OF AORTA REPAIR     REFRACTIVE SURGERY     TONSILLECTOMY        OB History     Gravida  2   Para  2   Term  2   Preterm      AB      Living  2  SAB      IAB      Ectopic      Multiple  0   Live Births  2               Current Medications: Current Meds  Medication Sig   acetaminophen  (TYLENOL ) 500 MG tablet Take 500 mg by mouth every 6 (six) hours as needed.   amLODipine  (NORVASC ) 10 MG tablet Take 1 tablet (10 mg total) by mouth daily.   Blood Pressure Monitoring (BLOOD PRESSURE MONITOR AUTOMAT) DEVI 1 Device by Does not apply route daily. Automatic blood pressure cuff regular size. To monitor blood pressure regularly at home. ICD-10 code: O09.90   Ferrous Sulfate (IRON PO) Take by mouth.   ibuprofen  (ADVIL ) 600 MG tablet Take 1 tablet (600 mg total) by mouth  every 6 (six) hours.   labetalol  (NORMODYNE ) 100 MG tablet Take 1 tablet (100 mg total) by mouth 2 (two) times daily.   Prenatal Vit-Fe Fumarate-FA (PRENATAL VITAMIN PO) Take by mouth daily. POST PRENATAL   PSYLLIUM HUSK PO Take by mouth.   sertraline  (ZOLOFT ) 25 MG tablet Take 3 tablets (75 mg total) by mouth daily.     Allergies:   Sudafed [pseudoephedrine]   Social History   Socioeconomic History   Marital status: Married    Spouse name: Not on file   Number of children: Not on file   Years of education: Not on file   Highest education level: Bachelor's degree (e.g., BA, AB, BS)  Occupational History   Not on file  Tobacco Use   Smoking status: Never   Smokeless tobacco: Never  Vaping Use   Vaping status: Never Used  Substance and Sexual Activity   Alcohol use: Not Currently    Alcohol/week: 3.0 - 4.0 standard drinks of alcohol    Types: 3 - 4 Glasses of wine per week    Comment: socially   Drug use: Never   Sexual activity: Not Currently  Other Topics Concern   Not on file  Social History Narrative   Not on file   Social Drivers of Health   Financial Resource Strain: Low Risk  (06/25/2022)   Received [facility], [facility]   Overall Financial Resource Strain (CARDIA)    Difficulty of Paying Living Expenses: Not hard at all  Food Insecurity: No Food Insecurity (08/05/2023)   Hunger Vital Sign    Worried About Running Out of Food in the Last Year: Never true    Ran Out of Food in the Last Year: Never true  Transportation Needs: No Transportation Needs (08/05/2023)   PRAPARE - Administrator, Civil Service (Medical): No    Lack of Transportation (Non-Medical): No  Physical Activity: Insufficiently Active (06/25/2022)   Received from [facility], [facility]   Exercise Vital Sign    Days of Exercise per Week: 2 days    Minutes of Exercise per Session: 40 min  Stress: Stress Concern Present (06/25/2022)   Received from [facility],  [facility] Occupational [facility] - Occupational Stress Questionnaire    Feeling of Stress : To some extent  Social Connections: Socially Integrated (08/18/2022)   Received from [facility], [facility]   Social Network    How would you rate your social network (family, work, friends)?: Good participation with social networks      Family History  Problem Relation Age of Onset   Healthy Mother    Hypertension Father  Asthma Brother    Diabetes Maternal Grandmother    Heart failure Maternal Grandmother    Hypertension Paternal Grandmother    Heart disease Paternal Grandmother    Diabetes Paternal Grandmother    Cancer Neg Hx       ROS:   Please see the history of present illness.     All other systems reviewed and are negative.   Labs/EKG Reviewed:    EKG:   EKG is  ordered today.  The ekg ordered today demonstrates   Recent Labs: 08/06/2023: ALT 11; BUN 8; Creatinine, Ser 0.73; Hemoglobin 11.3; Magnesium  4.1; Platelets 152; Potassium 3.8; Sodium 131   Recent Lipid Panel No results found for: CHOL, TRIG, HDL, CHOLHDL, LDLCALC, LDLDIRECT  Physical Exam:    VS:  BP 128/83 (BP Location: Left Arm, Patient Position: Sitting, Cuff Size: Normal)   Pulse 79   Ht 5' 8 (1.727 m)   Wt 183 lb (83 kg)   BMI 27.83 kg/m     Wt Readings from Last 3 Encounters:  08/18/23 183 lb (83 kg)  08/12/23 187 lb 6.4 oz (85 kg)  08/05/23 199 lb 3.2 oz (90.4 kg)     GEN:  Well nourished, well developed in no acute distress HEENT: Normal NECK: No JVD; No carotid bruits LYMPHATICS: No lymphadenopathy CARDIAC: RRR, no murmurs, rubs, gallops RESPIRATORY:  Clear to auscultation without rales, wheezing or rhonchi  ABDOMEN: Soft, non-tender, non-distended MUSCULOSKELETAL:  No edema; No deformity  SKIN: Warm and dry NEUROLOGIC:  Alert and oriented x 3 PSYCHIATRIC:  Normal affect    Risk Assessment/Risk Calculators:               ASSESSMENT  & PLAN:    Coarctation of the aorta history of repair Chronic hypertension in pregnancy Postpartum hypertension Dyspnea on exertion  Developed preeclampsia during her third trimester and delivered safely.  The only issue currently that she is experiencing is blood pressure control.  She is on amlodipine  10 mg daily we will continue that, I recently added labetalol  100 mg twice daily which has helped with the blood pressure control.  She is currently breast-feeding. Will keep the patient on the current regimen.  Will follow her in 12 weeks if blood pressure started improved then we can titrate her off gently the labetalol .  I plan to follow the patient closely during her immediate post partum in the cardio OB clinic.  She will be discharged postpartum back to her primary cardiologist: Dr. Shelda Bruckner.  Patient Instructions  Medication Instructions:  Your physician recommends that you continue on your current medications as directed. Please refer to the Current Medication list given to you today.  *If you need a refill on your cardiac medications before your next appointment, please call your pharmacy*   Follow-Up: At [facility], you and your health needs are our priority.  As part of our continuing mission to provide you with exceptional heart care, we have created designated Provider Care Teams.  These Care Teams include your primary Cardiologist (physician) and Advanced Practice Providers (APPs -  Physician Assistants and Nurse Practitioners) who all work together to provide you with the care you need, when you need it.   Your next appointment:   12 week(s)  Provider:   Linley Moxley, DO  Other Instructions You are invited to attend: Women's Heart Community event on February Friday 7th 2025 at [facility] ([address] [place], [place], [place] [address]) from 8am-12pm. Feel free to invite other  women to attend!  See you there!   Please take your blood pressure daily  for 2 weeks and send in a MyChart message. Please include heart rates. (One message at the end of the 2 weeks).   HOW TO TAKE YOUR BLOOD PRESSURE: Rest 5 minutes before taking your blood pressure. Don't smoke or drink caffeinated beverages for at least 30 minutes before. Take your blood pressure before (not after) you eat. Sit comfortably with your back supported and both feet on the floor (don't cross your legs). Elevate your arm to heart level on a table or a desk. Use the proper sized cuff. It should fit smoothly and snugly around your bare upper arm. There should be enough room to slip a fingertip under the cuff. The bottom edge of the cuff should be 1 inch above the crease of the elbow. Ideally, take 3 measurements at one sitting and record the average.         Dispo:  No follow-ups on file.   Medication Adjustments/Labs and Tests Ordered: Current medicines are reviewed at length with the patient today.  Concerns regarding medicines are outlined above.  Tests Ordered: No orders of the defined types were placed in this encounter.  Medication Changes: No orders of the defined types were placed in this encounter.

## 2023-08-18 NOTE — Patient Instructions (Signed)
 Medication Instructions:  Your physician recommends that you continue on your current medications as directed. Please refer to the Current Medication list given to you today.  *If you need a refill on your cardiac medications before your next appointment, please call your pharmacy*   Follow-Up: At [facility], you and your health needs are our priority.  As part of our continuing mission to provide you with exceptional heart care, we have created designated Provider Care Teams.  These Care Teams include your primary Cardiologist (physician) and Advanced Practice Providers (APPs -  Physician Assistants and Nurse Practitioners) who all work together to provide you with the care you need, when you need it.   Your next appointment:   12 week(s)  Provider:   Kardie Tobb, DO  Other Instructions You are invited to attend: Women's Heart Community event on February Friday 7th 2025 at [facility] ([address] [place], [place], [place] [address]) from 8am-12pm. Feel free to invite other women to attend!  See you there!   Please take your blood pressure daily for 2 weeks and send in a MyChart message. Please include heart rates. (One message at the end of the 2 weeks).   HOW TO TAKE YOUR BLOOD PRESSURE: Rest 5 minutes before taking your blood pressure. Don't smoke or drink caffeinated beverages for at least 30 minutes before. Take your blood pressure before (not after) you eat. Sit comfortably with your back supported and both feet on the floor (don't cross your legs). Elevate your arm to heart level on a table or a desk. Use the proper sized cuff. It should fit smoothly and snugly around your bare upper arm. There should be enough room to slip a fingertip under the cuff. The bottom edge of the cuff should be 1 inch above the crease of the elbow. Ideally, take 3 measurements at one sitting and record the average.

## 2023-08-21 ENCOUNTER — Encounter: Payer: Self-pay | Admitting: Certified Nurse Midwife

## 2023-08-23 ENCOUNTER — Telehealth (INDEPENDENT_AMBULATORY_CARE_PROVIDER_SITE_OTHER): Payer: Self-pay | Admitting: Otolaryngology

## 2023-08-23 NOTE — Telephone Encounter (Signed)
 Confirmed appt & location [phone] afm

## 2023-08-24 ENCOUNTER — Ambulatory Visit (INDEPENDENT_AMBULATORY_CARE_PROVIDER_SITE_OTHER): Payer: BC Managed Care – PPO | Admitting: Otolaryngology

## 2023-08-24 ENCOUNTER — Encounter (INDEPENDENT_AMBULATORY_CARE_PROVIDER_SITE_OTHER): Payer: Self-pay

## 2023-08-24 ENCOUNTER — Encounter: Payer: Self-pay | Admitting: Cardiology

## 2023-08-24 VITALS — BP 130/82 | HR 86 | Resp 19 | Wt 181.0 lb

## 2023-08-24 DIAGNOSIS — R040 Epistaxis: Secondary | ICD-10-CM

## 2023-08-24 DIAGNOSIS — R0981 Nasal congestion: Secondary | ICD-10-CM

## 2023-08-24 NOTE — Progress Notes (Signed)
 Dear Dr. Rena, Here is my assessment for our mutual patient, Cassandra Hoover. Thank you for allowing me the opportunity to care for your patient. Please do not hesitate to contact me should you have any other questions. Sincerely, Dr. Eldora Blanch  Otolaryngology Clinic Note  HISTORY: Cassandra Hoover is a 38 y.o. female kindly referred by Dr. Rena for evaluation of epistaxis  Initial visit (07/2023):  She is currently [redacted] weeks pregnant. Epistaxis history: June 2024 first time Frequency: monthly, but getting worse. Now every 2-3 times per week.  Side: mostly left before, now mostly right Intervention: afrin and pressure works most of the time Duration: hours; Went to [facility]U for a 5 hour bleed before in October, but did not require intervention. Onset: random HTN: yes (during pregnancy) CKD/Liver dysfunction: no Anticoagulation/AP: ASA 81 Trauma: no History of Sinusitis: no Nasal obstruction: no Nasal procedures: no Current nasal medication use: flonase prior (because of nasal congestion, likely due to pregnancy related congestion - stopped early Nov, which has improved frequency of nose bleeds). Using humidifier, vaseline and nasal gels Uses some loratidine for nasal congestion Mild AR symptoms (nasal itching) --------------------------------------------------------- 08/24/2023 Had one nose bleed christmas but otherwise no issues. She has been blowing her nose a lot and no bleeding. Baby is doing well. Not on ASA anymore. Not using anything in the nose. No bleeding from left.  ------------------------------------------------------- H&N Surgery: tonsillectomy  No problems with bleeding or anesthesia  PMHx: Anxiety, Coarctation of Aorta (prior, repaired)  Never smoked  RADIOGRAPHIC EVALUATION AND INDEPENDENT REVIEW OF OTHER RECORDS:: No CT Noted chart notes (06/08/2023): had nose bleed, reviewed photos 06/07/2023: MAU visit - epistaxis, tried saline, afrin, refer to ENT if  persistent 05/2023 CBC: Hgb 11, plt 246 06/2023 (Dr. Sheena) - Coarct aorta, HTN, DOE - down to ASA 81 daily, BP daily  Past Medical History:  Diagnosis Date   Anemia    Anxiety    Coarctation of aorta 05/17/2013   Since childhood, Coarct 1.7cmX1.8cm   Coarctation of aorta, congenital    surgical repair at 67 days old and 13 years   Gestational hypertension, third trimester 06/05/2020   Obstetric vaginal laceration with second degree perineal laceration 06/06/2020   Rh negative state in antepartum period 11/27/2019   UTI (urinary tract infection)    Past Surgical History:  Procedure Laterality Date   BREAST ENHANCEMENT SURGERY     COARCTATION OF AORTA REPAIR     REFRACTIVE SURGERY     TONSILLECTOMY     Family History  Problem Relation Age of Onset   Healthy Mother    Hypertension Father    Asthma Brother    Diabetes Maternal Grandmother    Heart failure Maternal Grandmother    Hypertension Paternal Grandmother    Heart disease Paternal Grandmother    Diabetes Paternal Grandmother    Cancer Neg Hx    Social History   Tobacco Use   Smoking status: Never   Smokeless tobacco: Never  Substance Use Topics   Alcohol use: Not Currently    Alcohol/week: 3.0 - 4.0 standard drinks of alcohol    Types: 3 - 4 Glasses of wine per week    Comment: socially   Allergies  Allergen Reactions   Sudafed [Pseudoephedrine] Palpitations   Current Outpatient Medications  Medication Sig Dispense Refill   acetaminophen  (TYLENOL ) 500 MG tablet Take 500 mg by mouth every 6 (six) hours as needed.     amLODipine  (NORVASC ) 10 MG tablet Take 1 tablet (10 mg  total) by mouth daily. 30 tablet 1   Blood Pressure Monitoring (BLOOD PRESSURE MONITOR AUTOMAT) DEVI 1 Device by Does not apply route daily. Automatic blood pressure cuff regular size. To monitor blood pressure regularly at home. ICD-10 code: O09.90 1 each 0   Ferrous Sulfate (IRON PO) Take by mouth.     ibuprofen  (ADVIL ) 600 MG tablet Take  1 tablet (600 mg total) by mouth every 6 (six) hours. 30 tablet 0   labetalol  (NORMODYNE ) 100 MG tablet Take 1 tablet (100 mg total) by mouth 2 (two) times daily. 90 tablet 3   Prenatal Vit-Fe Fumarate-FA (PRENATAL VITAMIN PO) Take by mouth daily. POST PRENATAL     PSYLLIUM HUSK PO Take by mouth.     sertraline  (ZOLOFT ) 25 MG tablet Take 3 tablets (75 mg total) by mouth daily. 270 tablet 6   No current facility-administered medications for this visit.   BP 130/82 (BP Location: Right Arm, Patient Position: Sitting, Cuff Size: Normal)   Pulse 86   Resp 19   Wt 181 lb (82.1 kg)   SpO2 97%   Breastfeeding Yes   BMI 27.52 kg/m   PHYSICAL EXAM:  BP 130/82 (BP Location: Right Arm, Patient Position: Sitting, Cuff Size: Normal)   Pulse 86   Resp 19   Wt 181 lb (82.1 kg)   SpO2 97%   Breastfeeding Yes   BMI 27.52 kg/m    Salient findings:  CN II-XII intact  Bilateral EAC clear and TM intact with well pneumatized middle ear spaces Nose: Anterior rhinoscopy reveals septum midline; no tuft of septal vessels on right; no epistaxis; small tuft of vessels midseptum left.  No lesions of oral cavity/oropharynx No obviously palpable neck masses/lymphadenopathy/thyromegaly No respiratory distress or stridor   PROCEDURE: None today  ASSESSMENT:  Ms. Musleh is a 38 y.o. F with:  1. Epistaxis   2. Nasal congestion    Has tried humidification but failed. S/p Endoscopic cautery in December 2024 on right. Doing well. No bleeds for past several weeks.  Can try ayr gel PRN for dry nose Otherwise f/u as needed  See below regarding exact medications prescribed this encounter including dosages and route: No orders of the defined types were placed in this encounter.  Thank you for allowing me the opportunity to care for your patient. Please do not hesitate to contact me should you have any other questions.  Sincerely, Eldora Blanch, MD Otolarynoglogist (ENT), [facility] Phone:  [phone] Fax: [phone]  MDM:  Level 3: 99213 Complexity/Problems addressed: chronic problem (epistaxis), improving Data complexity: low - Morbidity: low - Prescription Drug prescribed or managed: no  08/24/2023, 2:34 PM

## 2023-08-25 ENCOUNTER — Other Ambulatory Visit: Payer: Self-pay

## 2023-08-25 ENCOUNTER — Ambulatory Visit (INDEPENDENT_AMBULATORY_CARE_PROVIDER_SITE_OTHER): Payer: BC Managed Care – PPO

## 2023-08-25 VITALS — BP 133/87 | HR 79 | Ht 67.0 in | Wt 187.0 lb

## 2023-08-25 DIAGNOSIS — R350 Frequency of micturition: Secondary | ICD-10-CM

## 2023-08-25 MED ORDER — NITROFURANTOIN MONOHYD MACRO 100 MG PO CAPS: 100.0000 mg | ORAL_CAPSULE | Freq: Two times a day (BID) | ORAL | 0 refills | Status: DC

## 2023-08-25 MED ORDER — PHENAZOPYRIDINE HCL 200 MG PO TABS: 200.0000 mg | ORAL_TABLET | Freq: Three times a day (TID) | ORAL | 0 refills | Status: DC

## 2023-08-25 NOTE — Progress Notes (Signed)
Patient came in today for concern of frequent urination and burning pain for the last 7 days. UA showed moderate blood and trace leukocytes. Per physician, order Macrobid and Pyridium per protocol. Notified patient urine may turn orange while on Pyridium. Urine cultures sent. Also notified patient results may take a couple days and change of antibiotics may warrant depending on the urine culture results and it can be view via Mychart and will be called if any change of plan is needed. Patient voiced understanding.     Marcelino Duster, RN

## 2023-08-26 ENCOUNTER — Other Ambulatory Visit: Payer: Self-pay

## 2023-08-26 LAB — POCT URINALYSIS DIP (DEVICE)
Bilirubin Urine: NEGATIVE
Glucose, UA: NEGATIVE mg/dL
Ketones, ur: NEGATIVE mg/dL
Nitrite: NEGATIVE
Protein, ur: NEGATIVE mg/dL
Specific Gravity, Urine: 1.015 (ref 1.005–1.030)
Urobilinogen, UA: 0.2 mg/dL (ref 0.0–1.0)
pH: 7 (ref 5.0–8.0)

## 2023-08-26 MED ORDER — AMLODIPINE BESYLATE 5 MG PO TABS: 5.0000 mg | ORAL_TABLET | Freq: Every day | ORAL | Status: DC

## 2023-08-26 NOTE — Progress Notes (Signed)
Medication list updated.

## 2023-08-27 ENCOUNTER — Other Ambulatory Visit: Payer: Self-pay | Admitting: Obstetrics and Gynecology

## 2023-08-27 ENCOUNTER — Encounter: Payer: Self-pay | Admitting: Obstetrics and Gynecology

## 2023-08-27 DIAGNOSIS — N3001 Acute cystitis with hematuria: Secondary | ICD-10-CM

## 2023-08-27 LAB — URINE CULTURE

## 2023-08-27 MED ORDER — AMPICILLIN 500 MG PO CAPS: 500.0000 mg | ORAL_CAPSULE | Freq: Four times a day (QID) | ORAL | 0 refills | Status: AC

## 2023-09-08 ENCOUNTER — Ambulatory Visit: Payer: BC Managed Care – PPO | Admitting: Certified Nurse Midwife

## 2023-09-08 ENCOUNTER — Other Ambulatory Visit: Payer: Self-pay

## 2023-09-08 ENCOUNTER — Encounter: Payer: Self-pay | Admitting: Certified Nurse Midwife

## 2023-09-08 DIAGNOSIS — I10 Essential (primary) hypertension: Secondary | ICD-10-CM | POA: Diagnosis not present

## 2023-09-08 DIAGNOSIS — Z6791 Unspecified blood type, Rh negative: Secondary | ICD-10-CM

## 2023-09-08 DIAGNOSIS — F411 Generalized anxiety disorder: Secondary | ICD-10-CM

## 2023-09-08 DIAGNOSIS — Z30430 Encounter for insertion of intrauterine contraceptive device: Secondary | ICD-10-CM | POA: Diagnosis not present

## 2023-09-08 DIAGNOSIS — K6289 Other specified diseases of anus and rectum: Secondary | ICD-10-CM

## 2023-09-08 DIAGNOSIS — Z3202 Encounter for pregnancy test, result negative: Secondary | ICD-10-CM | POA: Diagnosis not present

## 2023-09-08 DIAGNOSIS — Z975 Presence of (intrauterine) contraceptive device: Secondary | ICD-10-CM

## 2023-09-08 LAB — POCT PREGNANCY, URINE: Preg Test, Ur: NEGATIVE

## 2023-09-08 MED ORDER — AMLODIPINE BESYLATE 5 MG PO TABS: 5.0000 mg | ORAL_TABLET | Freq: Every day | ORAL | 6 refills | Status: DC

## 2023-09-08 MED ORDER — LEVONORGESTREL 20.1 MCG/DAY IU IUD
1.0000 | INTRAUTERINE_SYSTEM | Freq: Once | INTRAUTERINE | Status: AC
  Administered 2023-09-08: 1 via INTRAUTERINE

## 2023-09-08 NOTE — Progress Notes (Signed)
Postpartum Visit Note  Cassandra Hoover is a 38 y.o. [id number] female who presents for a postpartum visit. She is 4 weeks 5 days postpartum following a normal spontaneous vaginal delivery.  I have fully reviewed the prenatal and intrapartum course. The delivery was at 39 gestational weeks.  Anesthesia: epidural. Postpartum course has been complicated by UTI (after preeclampsia and a hematoma in labor) . Baby is doing wel. Baby is feeding by breast. Bleeding staining only. Bowel function is abnormal; rectal pain. Bladder function is normal. Patient is not sexually active. Contraception method is IUD. Postpartum depression screening: negative.  The pregnancy intention screening data noted above was reviewed. Potential methods of contraception were discussed. The patient elected to proceed with IUD or IUS.   Edinburgh Postnatal Depression Scale - 09/08/23 1620       Edinburgh Postnatal Depression Scale:  In the Past 7 Days   I have been able to laugh and see the funny side of things. 0    I have looked forward with enjoyment to things. 0    I have blamed myself unnecessarily when things went wrong. 0    I have been anxious or worried for no good reason. 0    I have felt scared or panicky for no good reason. 0    Things have been getting on top of me. 0    I have been so unhappy that I have had difficulty sleeping. 0    I have felt sad or miserable. 0    I have been so unhappy that I have been crying. 0    The thought of harming myself has occurred to me. 0    Edinburgh Postnatal Depression Scale Total 0             Health Maintenance Due  Topic Date Due   Pneumococcal Vaccine 9-23 Years old (1 of 2 - PCV) Never done   COVID-19 Vaccine (5 - 2024-25 season) 04/11/2023   The following portions of the patient's history were reviewed and updated as appropriate: allergies, current medications, past family history, past medical history, past social history, past surgical history, and problem  list.  Review of Systems Pertinent items noted in HPI and remainder of comprehensive ROS otherwise negative.  Objective:  BP 135/85   Pulse 74   Wt 186 lb 14.4 oz (84.8 kg)   LMP 10/30/2022   Breastfeeding Yes   BMI 29.27 kg/m    Constitutional: Alert, oriented female in no physical distress.  HEENT: PERRLA Skin: normal color and turgor, no rash Cardiovascular: normal rate & rhythm Respiratory: normal effort, no problems with respiration noted GI: Abd soft, non-tender MS: Extremities nontender, no edema, normal ROM Neurologic: Alert and oriented x 4.  GU: no CVA tenderness Pelvic: perineum healing well, no sutures visible. No blood noted.  Breasts: normal lactating breasts, no edema or signs of nipple damage  IUD Insertion Procedure Note Patient identified, informed consent performed, consent signed.   Discussed risks of irregular bleeding, cramping, infection, malpositioning or misplacement of the IUD outside the uterus which may require further procedure such as laparoscopy. Also discussed >99% contraception efficacy, increased risk of ectopic pregnancy with failure of method.   Emphasized that this did not protect against STIs, condoms recommended during all sexual encounters. Time out was performed.  Chaperone present.  Urine pregnancy test negative.  Speculum placed in the vagina.  Cervix visualized.  Cleaned with Betadine x 2.  Grasped anteriorly with a single tooth tenaculum. Liletta IUD  placed per manufacturer's recommendations.  Strings trimmed to 3 cm. Tenaculum was removed, good hemostasis noted.  Patient tolerated procedure well.   Patient was given post-procedure instructions.  She was    Assessment:  1. Postpartum care and examination (Primary) - Recovering well  2. Generalized anxiety disorder - Stable  3. Blood type, Rh negative - Was given rhogam 12/27  4. Chronic hypertension - Has follow up scheduled with Dr. Servando Salina - amLODipine (NORVASC) 5 MG tablet;  Take 1 tablet (5 mg total) by mouth daily.  Dispense: 30 tablet; Refill: 6  5. Rectal pain - Likely internal hemorrhoid, advised to go back on fiber gummies - Ambulatory referral to Physical Therapy  6. Encounter for IUD insertion - Advised to have backup contraception for one week, to check IUD strings periodically and follow up in 4 weeks for IUD check. - Pregnancy, urine POC - levonorgestrel (LILETTA) 20.1 MCG/DAY IUD 1 each   Plan:   Essential components of care per ACOG recommendations:  1.  Mood and well being: Patient with negative depression screening today. Reviewed local resources for support.  - Patient tobacco use? No.   - hx of drug use? No.    2. Infant care and feeding:  -Patient currently breastmilk feeding? Yes. Reviewed importance of draining breast regularly to support lactation.  -Social determinants of health (SDOH) reviewed in EPIC. No concerns  3. Sexuality, contraception and birth spacing - Patient does not want a pregnancy in the next year.  Desired family size is 2 children.  - Reviewed reproductive life planning. Reviewed contraceptive methods based on pt preferences and effectiveness.  Patient desired IUD or IUS today.   - Discussed birth spacing of 18 months  4. Sleep and fatigue -Encouraged family/partner/community support of 4 hrs of uninterrupted sleep to help with mood and fatigue  5. Physical Recovery  - Discussed patients delivery and complications. She describes her labor as mixed. - Patient had a Vaginal problems after delivery including retained placenta and vaginal hematoma . Patient had a  superficial at anterior fourchette  laceration. Perineal healing reviewed. Patient expressed understanding - Patient has urinary incontinence? No. - Patient is safe to resume physical and sexual activity  6.  Health Maintenance - HM due items addressed Yes - Last pap smear  Diagnosis  Date Value Ref Range Status  01/21/2023   Final   - Negative for  intraepithelial lesion or malignancy (NILM)   Pap smear not done at today's visit.  -Breast Cancer screening indicated? No.   7. Chronic Disease/Pregnancy Condition follow up: Hypertension - PCP follow up as needed  String check in 4 weeks.  Bernerd Limbo, CNM [facility] [employer], [facility]

## 2023-09-10 DIAGNOSIS — Z975 Presence of (intrauterine) contraceptive device: Secondary | ICD-10-CM | POA: Insufficient documentation

## 2023-09-16 ENCOUNTER — Encounter: Payer: BC Managed Care – PPO | Attending: Certified Nurse Midwife | Admitting: Physical Therapy

## 2023-09-16 ENCOUNTER — Other Ambulatory Visit: Payer: Self-pay

## 2023-09-16 ENCOUNTER — Encounter: Payer: Self-pay | Admitting: Physical Therapy

## 2023-09-16 DIAGNOSIS — R252 Cramp and spasm: Secondary | ICD-10-CM | POA: Diagnosis present

## 2023-09-16 DIAGNOSIS — K6289 Other specified diseases of anus and rectum: Secondary | ICD-10-CM | POA: Insufficient documentation

## 2023-09-16 NOTE — Patient Instructions (Signed)
How To Poop Better: ° °What are Good Poops? °There is no one exact normal, but they should be REGULAR.  This varies from person to person and ranges from up to 3x/day or as little as 3-4/week.  This should stay consistent for you.  They should be formed and ideally one solid mass that doesn't fall apart or dissolve in the water and is brown in color. ° °Lifestyle Tips: ° °Fiber: Eat 25-31 grams per day °Do not hold it.  If you need to go, GO! °Try to go every day around the same time °Walk and move more °Probiotics for more healthy gut bacteria °Water and fluids: half of your healthy body weight in ounces ° °Toileting Tips: ° °Posture: knees above hips, back flat, look straight ahead, RELAX °Relax all the muscles from your face down to your toes °Breathe: slow deep breaths into your belly and pelvic floor is RELAXED °Blow: Tighten belly and blow like blowing up a balloon, make “SH” sound, make a vowel sound with a deep voice °Do NOT sit more than 10 minutes °After you are finished, tighten the muscles to reset pelvic floor back to normal °

## 2023-09-16 NOTE — Therapy (Signed)
 OUTPATIENT PHYSICAL THERAPY FEMALE PELVIC EVALUATION   Patient Name: Cassandra Hoover MRN: [id number] DOB:05-Jul-1986, 38 y.o., female Today's Date: 09/16/2023  END OF SESSION:  PT End of Session - 09/16/23 1307     Visit Number 1    Date for PT Re-Evaluation 03/15/24    Authorization Type BCBS    PT Start Time 1300    PT Stop Time 1345    PT Time Calculation (min) 45 min    Activity Tolerance Patient tolerated treatment well    Behavior During Therapy [facility] for tasks assessed/performed             Past Medical History:  Diagnosis Date   Anemia    Anxiety    Coarctation of aorta 05/17/2013   Since childhood, Coarct 1.7cmX1.8cm   Coarctation of aorta, congenital    surgical repair at 42 days old and 13 years   Gestational hypertension, third trimester 06/05/2020   Obstetric vaginal laceration with second degree perineal laceration 06/06/2020   Rh negative state in antepartum period 11/27/2019   UTI (urinary tract infection)    Past Surgical History:  Procedure Laterality Date   BREAST ENHANCEMENT SURGERY     COARCTATION OF AORTA REPAIR     REFRACTIVE SURGERY     TONSILLECTOMY     Patient Active Problem List   Diagnosis Date Noted   IUD (intrauterine device) in place 09/10/2023   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   Irritable bowel syndrome 02/17/2023   Generalized anxiety disorder 06/26/2022   Chronic hypertension 07/21/2021   Blood type, Rh negative 11/27/2019   Coarctation of aorta, congenital    Aortic valve disorder 04/27/2013    PCP: Rena Luke POUR, MD  REFERRING PROVIDER: Vannie Cornell SAUNDERS, CNM   REFERRING DIAG: [phone] (ICD-10-CM) - Rectal pain   THERAPY DIAG:  Cramp and spasm  Rectal pain  Rationale for Evaluation and Treatment: Rehabilitation  ONSET DATE: 08/08/24  SUBJECTIVE:                                                                                                                                                                                            SUBJECTIVE STATEMENT: Vaginal delivery on 08/04/24. I have had only 5 days without pain since giving birth.  Fluid intake: Yes: water, body armour, liquid IV and gatorade, 1 coffee per day and 2 herbal tea    PAIN:  Are you having pain? Yes NPRS scale: 8/10 Pain location: Anal  Pain type: sharp and throbbing Pain description: intermittent   Aggravating factors: urge to have a bowel movement, during the bowel movement and can linger for awhile.  Relieving factors:  Tylenol  or Ibuprofen   PRECAUTIONS: None  RED FLAGS: None   WEIGHT BEARING RESTRICTIONS: No  FALLS:  Has patient fallen in last 6 months? No  LIVING ENVIRONMENT: Lives with: lives with their family  OCCUPATION: stay at home mom  PLOF: Independent  PATIENT GOALS: reduce rectal pain and reduce urinary urgency  PERTINENT HISTORY:  Coarctation of aorta repair; IBS   BOWEL MOVEMENT: Pain with bowel movement: Yes Type of bowel movement:Type (Bristol Stool Scale) Type 2 , Frequency sporadic, Strain No, and Splinting no Fully empty rectum: Yes: most of the time Leakage: Yes: small amount , using Dr. Towana fissure ointment Fiber supplement: Yes: using natural fiber  URINATION: Pain with urination: Yes pelvic bone feel sore with opening the pelvis Fully empty bladder: Yes:   Stream: Strong Urgency: Yes:   Frequency: average Leakage: Sneezing forcefully Pads: No  INTERCOURSE: has not tried intercourse yet  PREGNANCY: Vaginal deliveries 2 Tearing Yes: second degree  Currently pregnant No  PROLAPSE: None   OBJECTIVE:  Note: Objective measures were completed at Evaluation unless otherwise noted.  DIAGNOSTIC FINDINGS:  none   COGNITION: Overall cognitive status: Within functional limits for tasks assessed     SENSATION: Light touch: Appears intact Proprioception: Appears intact    PELVIC ALIGNMENT:  LUMBARAROM/PROM: full lumbar ROM   LOWER EXTREMITY MNF:apojuzmjo hip ROM is  full   LOWER EXTREMITY MMT:  MMT Right eval Left eval  Hip extension 4/5 4/5  Hip abduction 4/5 4/5   PALPATION:   General  decreased contraction of the abdominals                External Perineal Exam     tightness in the perineal body                        Internal Pelvic Floor tenderness located in the puborectalis, coccygeus, iliococcygeus  Patient confirms identification and approves PT to assess internal pelvic floor and treatment Yes  PELVIC MMT:   MMT eval  Vaginal 3/5  Internal Anal Sphincter 3/5  External Anal Sphincter 2/5  Puborectalis 2/5  Diastasis Recti 2 finger width above and 1 finger width below with good tension  (Blank rows = not tested)         PROLAPSE: Anterior wall weakness 1/2 way in the vaginal canal  TODAY'S TREATMENT:                                                                                                                              DATE: 09/16/23  EVAL see below   PATIENT EDUCATION:  Education details: how to massage around the rectum and perineal body Person educated: Patient Education method: Explanation, Demonstration, Tactile cues, Verbal cues, and Handouts Education comprehension: verbalized understanding, returned demonstration, verbal cues required, tactile cues required, and needs further education  HOME EXERCISE PROGRAM: See above.   ASSESSMENT:  CLINICAL IMPRESSION: Patient is a 38 y.o. female  who was seen today for physical therapy evaluation and treatment for rectal pain.  She delivered her last child vaginally on 08/05/23. Intermittent rectal pain is  8/10 with the urge to have a bowel movement, during and after a bowel movement. She may have stool leakage at times. She has some weakness in the hips. Her vaginal strength is 3/5. Rectal strength is 2/5. She has tenderness located in the puborectalis, coccygeus, iliococcygeus. She has decreased movement of the puborectalis and of the perineal body. Patient will benefit  from skilled therapy to improve mobility of the tissue and reduce pain to improve quality of life.   OBJECTIVE IMPAIRMENTS: decreased coordination, decreased strength, increased fascial restrictions, increased muscle spasms, and pain.   ACTIVITY LIMITATIONS: toileting  PARTICIPATION LIMITATIONS: driving, shopping, and community activity  PERSONAL FACTORS: Time since onset of injury/illness/exacerbation are also affecting patient's functional outcome.   REHAB POTENTIAL: Excellent  CLINICAL DECISION MAKING: Evolving/moderate complexity  EVALUATION COMPLEXITY: Moderate   GOALS: Goals reviewed with patient? Yes  SHORT TERM GOALS: Target date: 10/14/23  Patient independent with initial HEP for flexibility and diaphragmatic breathing.  Baseline: Goal status: INITIAL  2.  Patient reports her rectal pain decreased >/= 25% with bowel movements.  Baseline:  Goal status: INITIAL  3.  Patient has been educated on correct ways to have a bowel movement to relax the pelvic floor.  Baseline:  Goal status: INITIAL   LONG TERM GOALS: Target date: 03/15/24  Patient independent with advanced HEP for core and pelvic floor.  Baseline:  Goal status: INITIAL  2.  Patient is able to have a bowel movement with pain level 0-1/10 due to decreased trigger points in the pelvic floor muscles.  Baseline:  Goal status: INITIAL  3.  Patient reports she is not having stool leakage due to the improved contraction of the puborectalis.  Baseline:  Goal status: INITIAL  4.  Patient reports her urinary urgency has decreased >/= 75% due to the reduction of trigger points in the muscles.  Baseline:  Goal status: INITIAL   PLAN:  PT FREQUENCY: 1-2x/week  PT DURATION: 6 months  PLANNED INTERVENTIONS: 97110-Therapeutic exercises, 97530- Therapeutic activity, 97112- Neuromuscular re-education, 97535- Self Care, [id number]- Manual therapy, [id number]- Electrical stimulation (unattended), 97035- Ultrasound,  Patient/Family education, Dry Needling, Joint mobilization, Spinal mobilization, Cryotherapy, Moist heat, and Biofeedback  PLAN FOR NEXT SESSION: manual work to the pelvic floor, diaphragmatic breathing, abdominal exercises   Channing Pereyra, PT 09/16/23 5:32 PM

## 2023-09-28 ENCOUNTER — Encounter: Payer: Self-pay | Admitting: Physical Therapy

## 2023-09-28 DIAGNOSIS — R252 Cramp and spasm: Secondary | ICD-10-CM | POA: Diagnosis not present

## 2023-09-28 DIAGNOSIS — K6289 Other specified diseases of anus and rectum: Secondary | ICD-10-CM

## 2023-09-28 NOTE — Therapy (Signed)
OUTPATIENT PHYSICAL THERAPY FEMALE PELVIC TREATMENT   Patient Name: Cassandra Hoover MRN: [id number] DOB:07-Feb-1986, 38 y.o., female Today's Date: 09/28/2023  END OF SESSION:  PT End of Session - 09/28/23 0841     Visit Number 2    Date for PT Re-Evaluation 03/15/24    Authorization Type BCBS    PT Start Time [id number]    PT Stop Time 0925    PT Time Calculation (min) 44 min    Activity Tolerance Patient tolerated treatment well    Behavior During Therapy [facility] for tasks assessed/performed             Past Medical History:  Diagnosis Date   Anemia    Anxiety    Coarctation of aorta 05/17/2013   Since childhood, Coarct 1.7cmX1.8cm   Coarctation of aorta, congenital    surgical repair at 33 days old and 13 years   Gestational hypertension, third trimester 06/05/2020   Obstetric vaginal laceration with second degree perineal laceration 06/06/2020   Rh negative state in antepartum period 11/27/2019   UTI (urinary tract infection)    Past Surgical History:  Procedure Laterality Date   BREAST ENHANCEMENT SURGERY     COARCTATION OF AORTA REPAIR     REFRACTIVE SURGERY     TONSILLECTOMY     Patient Active Problem List   Diagnosis Date Noted   IUD (intrauterine device) in place 09/10/2023   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   Irritable bowel syndrome 02/17/2023   Generalized anxiety disorder 06/26/2022   Chronic hypertension 07/21/2021   Blood type, Rh negative 11/27/2019   Coarctation of aorta, congenital    Aortic valve disorder 04/27/2013    PCP: Macy Mis, MD  REFERRING PROVIDER: Bernerd Limbo, CNM   REFERRING DIAG: [phone] (ICD-10-CM) - Rectal pain   THERAPY DIAG:  Cramp and spasm  Rectal pain  Rationale for Evaluation and Treatment: Rehabilitation  ONSET DATE: 08/08/24  SUBJECTIVE:                                                                                                                                                                                            SUBJECTIVE STATEMENT: No pain in between bowel movements. I have pain 3 out of 5 times. Pain is only coming when having a larger bowel movement. No more constant pain. Urinary urgency is now not making it feel like she will wet her pants. No urinary leakage since last visit.  Fluid intake: Yes: water, body armour, liquid IV and gatorade, 1 coffee per day and 2 herbal tea    PAIN:  09/28/23 Are you having pain? Yes NPRS scale: 3/10  Pain location: Anal  Pain type: sharp and throbbing Pain description: intermittent   Aggravating factors: urge to have a bowel movement, during the bowel movement   Relieving factors: Tylenol or Ibuprofen  PRECAUTIONS: None  RED FLAGS: None   WEIGHT BEARING RESTRICTIONS: No  FALLS:  Has patient fallen in last 6 months? No  LIVING ENVIRONMENT: Lives with: lives with their family  OCCUPATION: stay at home mom  PLOF: Independent  PATIENT GOALS: reduce rectal pain and reduce urinary urgency  PERTINENT HISTORY:  Coarctation of aorta repair; IBS   BOWEL MOVEMENT: Pain with bowel movement: Yes Type of bowel movement:Type (Bristol Stool Scale) Type 2 , Frequency sporadic, Strain No, and Splinting no Fully empty rectum: Yes: most of the time Leakage: Yes: small amount , using Dr. Charm Barges fissure ointment Fiber supplement: Yes: using natural fiber  URINATION: Pain with urination: Yes pelvic bone feel sore with opening the pelvis Fully empty bladder: Yes:   Stream: Strong Urgency: Yes:   Frequency: average Leakage: Sneezing forcefully Pads: No  INTERCOURSE: has not tried intercourse yet  PREGNANCY: Vaginal deliveries 2 Tearing Yes: second degree  Currently pregnant No  PROLAPSE: None   OBJECTIVE:  Note: Objective measures were completed at Evaluation unless otherwise noted.  DIAGNOSTIC FINDINGS:  none   COGNITION: Overall cognitive status: Within functional limits for tasks assessed     SENSATION: Light touch: Appears  intact Proprioception: Appears intact    PELVIC ALIGNMENT:  LUMBARAROM/PROM: full lumbar ROM   LOWER EXTREMITY JYN:[id number] hip ROM is full   LOWER EXTREMITY MMT:  MMT Right eval Left eval  Hip extension 4/5 4/5  Hip abduction 4/5 4/5   PALPATION:   General  decreased contraction of the abdominals                External Perineal Exam     tightness in the perineal body                        Internal Pelvic Floor tenderness located in the puborectalis, coccygeus, iliococcygeus  Patient confirms identification and approves PT to assess internal pelvic floor and treatment Yes  PELVIC MMT:   MMT eval 09/28/23  Vaginal 3/5 3/5  Internal Anal Sphincter 3/5 3/5  External Anal Sphincter 2/5 3/5  Puborectalis 2/5 3/5  Diastasis Recti 2 finger width above and 1 finger width below with good tension   (Blank rows = not tested)         PROLAPSE: Anterior wall weakness 1/2 way in the vaginal canal  TODAY'S TREATMENT:        09/28/23 Manual: Internal pelvic floor techniques: No emotional/communication barriers or cognitive limitation. Patient is motivated to learn. Patient understands and agrees with treatment goals and plan. PT explains patient will be examined in standing, sitting, and lying down to see how their muscles and joints work. When they are ready, they will be asked to remove their underwear so PT can examine their perineum. The patient is also given the option of providing their own chaperone as one is not provided in our facility. The patient also has the right and is explained the right to defer or refuse any part of the evaluation or treatment including the internal exam. With the patient's consent, PT will use one gloved finger to gently assess the muscles of the pelvic floor, seeing how well it contracts and relaxes and if there is muscle symmetry. After, the patient will get dressed and PT and  patient will discuss exam findings and plan of care. PT and patient  discuss plan of care, schedule, attendance policy and HEP activities.  Going through the rectum working on the iliococcygeus, along the coccygeus, anococcygeal ligament, puborectalis with hip movements Going through the vaginal canal working on the levator ani and obturator internist with pelvic movements and hip movements.  Neuromuscular re-education: Pelvic floor contraction training: Pelvic floor contraction with finger in the vaginal canal working on lifting of the pelvic floor Therapist finger in the rectum working on anal strength, puborectalis moving forward and 20 second contractions Exercises: Stretches/mobility: Cat cow 15 times to improve lumbar mobility Childs pose with diaphragmatic breathing Pigeon pose with knee flexed that is straight Frog stretch Deep squat with yoga block to sit on and perform diaphragmatic breathing.       PATIENT EDUCATION:  09/28/23 Education details: how to massage around the rectum and perineal body; Access Code: [email or web address] Person educated: Patient Education method: Explanation, Demonstration, Tactile cues, Verbal cues, and Handouts Education comprehension: verbalized understanding, returned demonstration, verbal cues required, tactile cues required, and needs further education  HOME EXERCISE PROGRAM: 09/28/23 Access Code: [email or web address] URL: [email or web address] Date: 09/28/2023 Prepared by: Eulis Foster  Exercises - Cat Cow  - 1 x daily - 7 x weekly - 3 sets - 10 reps - Diaphragmatic Breathing in Child's Pose with Pelvic Floor Relaxation  - 1 x daily - 7 x weekly - 1 sets - 10 reps - Pigeon Pose  - 1 x daily - 7 x weekly - 1 sets - 1 reps - 30 sec hold - Quadruped Adductor Stretch  - 1 x daily - 7 x weekly - 1 sets - 1 reps - 30 sec hold - Deep Squat with Pelvic Floor Relaxation  - 1 x daily - 7 x weekly - 1 sets - 1 reps - 30 sec hold - Seated Pelvic Floor Contraction  - 3 x daily - 7 x weekly - 1 sets - 10 reps - 10 sec  hold  ASSESSMENT:  CLINICAL IMPRESSION: Patient is a 38 y.o. female who was seen today for physical therapy  treatment for rectal pain.  She delivered her last child vaginally on 08/05/23. Intermittent rectal pain is  3/10 instead of 8/10. She may have stool leakage at times. She has some weakness in the hips. No urinary leakage since last visit.  She has tenderness located in the puborectalis, coccygeus, iliococcygeus. Pelvic floor strength increased to 3/5.  Patient will benefit from skilled therapy to improve mobility of the tissue and reduce pain to improve quality of life.   OBJECTIVE IMPAIRMENTS: decreased coordination, decreased strength, increased fascial restrictions, increased muscle spasms, and pain.   ACTIVITY LIMITATIONS: toileting  PARTICIPATION LIMITATIONS: driving, shopping, and community activity  PERSONAL FACTORS: Time since onset of injury/illness/exacerbation are also affecting patient's functional outcome.   REHAB POTENTIAL: Excellent  CLINICAL DECISION MAKING: Evolving/moderate complexity  EVALUATION COMPLEXITY: Moderate   GOALS: Goals reviewed with patient? Yes  SHORT TERM GOALS: Target date: 10/14/23  Patient independent with initial HEP for flexibility and diaphragmatic breathing.  Baseline: Goal status: INITIAL  2.  Patient reports her rectal pain decreased >/= 25% with bowel movements.  Baseline:  Goal status: INITIAL  3.  Patient has been educated on correct ways to have a bowel movement to relax the pelvic floor.  Baseline:  Goal status: INITIAL   LONG TERM GOALS: Target date: 03/15/24  Patient independent with advanced HEP for core and pelvic floor.  Baseline:  Goal status: INITIAL  2.  Patient is able to have a bowel movement with pain level 0-1/10 due to decreased trigger points in the pelvic floor muscles.  Baseline:  Goal status: INITIAL  3.  Patient reports she is not having stool leakage due to the improved contraction of the  puborectalis.  Baseline:  Goal status: INITIAL  4.  Patient reports her urinary urgency has decreased >/= 75% due to the reduction of trigger points in the muscles.  Baseline:  Goal status: INITIAL   PLAN:  PT FREQUENCY: 1-2x/week  PT DURATION: 6 months  PLANNED INTERVENTIONS: 97110-Therapeutic exercises, 97530- Therapeutic activity, 97112- Neuromuscular re-education, 97535- Self Care, 64403- Manual therapy, 97014- Electrical stimulation (unattended), 97035- Ultrasound, Patient/Family education, Dry Needling, Joint mobilization, Spinal mobilization, Cryotherapy, Moist heat, and Biofeedback  PLAN FOR NEXT SESSION: manual work to the abdomen,  diaphragmatic breathing, abdominal exercises, toileting   Eulis Foster, PT 09/28/23 10:14 AM

## 2023-09-30 ENCOUNTER — Encounter: Payer: Self-pay | Admitting: General Practice

## 2023-10-05 ENCOUNTER — Encounter: Payer: BC Managed Care – PPO | Admitting: Physical Therapy

## 2023-10-05 ENCOUNTER — Encounter: Payer: Self-pay | Admitting: Physical Therapy

## 2023-10-05 DIAGNOSIS — K6289 Other specified diseases of anus and rectum: Secondary | ICD-10-CM

## 2023-10-05 DIAGNOSIS — R252 Cramp and spasm: Secondary | ICD-10-CM

## 2023-10-05 NOTE — Therapy (Signed)
 OUTPATIENT PHYSICAL THERAPY FEMALE PELVIC TREATMENT   Patient Name: Cassandra Hoover MRN: [id number] DOB:21-Jan-1986, 38 y.o., female Today's Date: 10/05/2023  END OF SESSION:  PT End of Session - 10/05/23 1414     Visit Number 3    Date for PT Re-Evaluation 03/15/24    Authorization Type BCBS    PT Start Time 1410    PT Stop Time 1459    PT Time Calculation (min) 49 min    Activity Tolerance Patient tolerated treatment well    Behavior During Therapy WFL for tasks assessed/performed             Past Medical History:  Diagnosis Date   Anemia    Anxiety    Coarctation of aorta 05/17/2013   Since childhood, Coarct 1.7cmX1.8cm   Coarctation of aorta, congenital    surgical repair at 53 days old and 13 years   Gestational hypertension, third trimester 06/05/2020   Obstetric vaginal laceration with second degree perineal laceration 06/06/2020   Rh negative state in antepartum period 11/27/2019   UTI (urinary tract infection)    Past Surgical History:  Procedure Laterality Date   BREAST ENHANCEMENT SURGERY     COARCTATION OF AORTA REPAIR     REFRACTIVE SURGERY     TONSILLECTOMY     Patient Active Problem List   Diagnosis Date Noted   IUD (intrauterine device) in place 09/10/2023   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   Irritable bowel syndrome 02/17/2023   Generalized anxiety disorder 06/26/2022   Chronic hypertension 07/21/2021   Blood type, Rh negative 11/27/2019   Coarctation of aorta, congenital    Aortic valve disorder 04/27/2013    PCP: Macy Mis, MD  REFERRING PROVIDER: Bernerd Limbo, CNM   REFERRING DIAG: [phone] (ICD-10-CM) - Rectal pain   THERAPY DIAG:  Cramp and spasm  Rectal pain  Rationale for Evaluation and Treatment: Rehabilitation  ONSET DATE: 08/08/24  SUBJECTIVE:                                                                                                                                                                                            SUBJECTIVE STATEMENT: No pain in between bowel movements. I have pain 1 out of 5 times. I have not needed the fissure ointment. Feels the pubic bone pressing down when having a bowel movement.   Fluid intake: Yes: water, body armour, liquid IV and gatorade, 1 coffee per day and 2 herbal tea    PAIN:  09/28/23 Are you having pain? Yes NPRS scale: 3/10 Pain location: Anal  Pain type: sharp and throbbing Pain description: intermittent   Aggravating  factors: urge to have a bowel movement, during the bowel movement   Relieving factors: Tylenol or Ibuprofen  PRECAUTIONS: None  RED FLAGS: None   WEIGHT BEARING RESTRICTIONS: No  FALLS:  Has patient fallen in last 6 months? No  LIVING ENVIRONMENT: Lives with: lives with their family  OCCUPATION: stay at home mom  PLOF: Independent  PATIENT GOALS: reduce rectal pain and reduce urinary urgency  PERTINENT HISTORY:  Coarctation of aorta repair; IBS   BOWEL MOVEMENT: Pain with bowel movement: Yes Type of bowel movement:Type (Bristol Stool Scale) Type 2 , Frequency sporadic, Strain No, and Splinting no Fully empty rectum: Yes: most of the time Leakage: Yes: small amount , using Dr. Charm Barges fissure ointment Fiber supplement: Yes: using natural fiber  URINATION: Pain with urination: Yes pelvic bone feel sore with opening the pelvis Fully empty bladder: Yes:   Stream: Strong Urgency: Yes:   Frequency: average Leakage: Sneezing forcefully Pads: No  INTERCOURSE: has not tried intercourse yet  PREGNANCY: Vaginal deliveries 2 Tearing Yes: second degree  Currently pregnant No  PROLAPSE: None   OBJECTIVE:  Note: Objective measures were completed at Evaluation unless otherwise noted.  DIAGNOSTIC FINDINGS:  none   COGNITION: Overall cognitive status: Within functional limits for tasks assessed     SENSATION: Light touch: Appears intact Proprioception: Appears intact    PELVIC ALIGNMENT:  LUMBARAROM/PROM:  full lumbar ROM   LOWER EXTREMITY [id number] hip ROM is full   LOWER EXTREMITY MMT:  MMT Right eval Left eval  Hip extension 4/5 4/5  Hip abduction 4/5 4/5   PALPATION:   General  decreased contraction of the abdominals                External Perineal Exam     tightness in the perineal body                        Internal Pelvic Floor tenderness located in the puborectalis, coccygeus, iliococcygeus  Patient confirms identification and approves PT to assess internal pelvic floor and treatment Yes  PELVIC MMT:   MMT eval 09/28/23 10/05/23  Vaginal 3/5 3/5 4/5  Internal Anal Sphincter 3/5 3/5   External Anal Sphincter 2/5 3/5   Puborectalis 2/5 3/5   Diastasis Recti 2 finger width above and 1 finger width below with good tension    (Blank rows = not tested)         PROLAPSE: Anterior wall weakness 1/2 way in the vaginal canal  TODAY'S TREATMENT:     10/05/23 Manual: Soft tissue mobilization: Manual work to the perineal body, external anal sphincter anteriorly, along the ischiocavernosus, bulbocavernosus, superior transverse perineum, along the clitoral hood Myofascial release: Fascial release of the mons pubis and round ligament Internal pelvic floor techniques: No emotional/communication barriers or cognitive limitation. Patient is motivated to learn. Patient understands and agrees with treatment goals and plan. PT explains patient will be examined in standing, sitting, and lying down to see how their muscles and joints work. When they are ready, they will be asked to remove their underwear so PT can examine their perineum. The patient is also given the option of providing their own chaperone as one is not provided in our facility. The patient also has the right and is explained the right to defer or refuse any part of the evaluation or treatment including the internal exam. With the patient's consent, PT will use one gloved finger to gently assess the muscles of the  pelvic floor, seeing how well it contracts and relaxes and if there is muscle symmetry. After, the patient will get dressed and PT and patient will discuss exam findings and plan of care. PT and patient discuss plan of care, schedule, attendance policy and HEP activities.  Going through the vaginal canal working on the pubovaginalis, puborectalis and pubococcygeus, along the posterior vaginal canal and along the introitus Exercises: Stretches/mobility: Deep squat while holding the door jam to stretch the low back further and deep squat without holding on       09/28/23 Manual: Internal pelvic floor techniques: No emotional/communication barriers or cognitive limitation. Patient is motivated to learn. Patient understands and agrees with treatment goals and plan. PT explains patient will be examined in standing, sitting, and lying down to see how their muscles and joints work. When they are ready, they will be asked to remove their underwear so PT can examine their perineum. The patient is also given the option of providing their own chaperone as one is not provided in our facility. The patient also has the right and is explained the right to defer or refuse any part of the evaluation or treatment including the internal exam. With the patient's consent, PT will use one gloved finger to gently assess the muscles of the pelvic floor, seeing how well it contracts and relaxes and if there is muscle symmetry. After, the patient will get dressed and PT and patient will discuss exam findings and plan of care. PT and patient discuss plan of care, schedule, attendance policy and HEP activities.  Going through the rectum working on the iliococcygeus, along the coccygeus, anococcygeal ligament, puborectalis with hip movements Going through the vaginal canal working on the levator ani and obturator internist with pelvic movements and hip movements.  Neuromuscular re-education: Pelvic floor contraction  training: Pelvic floor contraction with finger in the vaginal canal working on lifting of the pelvic floor Therapist finger in the rectum working on anal strength, puborectalis moving forward and 20 second contractions Exercises: Stretches/mobility: Cat cow 15 times to improve lumbar mobility Childs pose with diaphragmatic breathing Pigeon pose with knee flexed that is straight Frog stretch Deep squat with yoga block to sit on and perform diaphragmatic breathing.       PATIENT EDUCATION:  09/28/23 Education details: how to massage around the rectum and perineal body; Access Code: [email or web address] Person educated: Patient Education method: Explanation, Demonstration, Tactile cues, Verbal cues, and Handouts Education comprehension: verbalized understanding, returned demonstration, verbal cues required, tactile cues required, and needs further education  HOME EXERCISE PROGRAM: 09/28/23 Access Code: [email or web address] URL: [email or web address] Date: 09/28/2023 Prepared by: Eulis Foster  Exercises - Cat Cow  - 1 x daily - 7 x weekly - 3 sets - 10 reps - Diaphragmatic Breathing in Child's Pose with Pelvic Floor Relaxation  - 1 x daily - 7 x weekly - 1 sets - 10 reps - Pigeon Pose  - 1 x daily - 7 x weekly - 1 sets - 1 reps - 30 sec hold - Quadruped Adductor Stretch  - 1 x daily - 7 x weekly - 1 sets - 1 reps - 30 sec hold - Deep Squat with Pelvic Floor Relaxation  - 1 x daily - 7 x weekly - 1 sets - 1 reps - 30 sec hold - Seated Pelvic Floor Contraction  - 3 x daily - 7 x weekly - 1 sets - 10 reps - 10 sec hold  ASSESSMENT:  CLINICAL IMPRESSION: Patient is a 38  y.o. female who was seen today for physical therapy  treatment for rectal pain.  She delivered her last child vaginally on 08/05/23. Intermittent rectal pain is 1 out of 5 bowel movements.  Pelvic floor strength increased to 4/5.  She feels like the anterior pelvic floor comes down with a bowel movement. She had restrictions along  the first layer of the pelvic floor, perineal body and around the mons pubis. She is using the breath work with bowel movements and it is helping. Patient will benefit from skilled therapy to improve mobility of the tissue and reduce pain to improve quality of life.   OBJECTIVE IMPAIRMENTS: decreased coordination, decreased strength, increased fascial restrictions, increased muscle spasms, and pain.   ACTIVITY LIMITATIONS: toileting  PARTICIPATION LIMITATIONS: driving, shopping, and community activity  PERSONAL FACTORS: Time since onset of injury/illness/exacerbation are also affecting patient's functional outcome.   REHAB POTENTIAL: Excellent  CLINICAL DECISION MAKING: Evolving/moderate complexity  EVALUATION COMPLEXITY: Moderate   GOALS: Goals reviewed with patient? Yes  SHORT TERM GOALS: Target date: 10/14/23  Patient independent with initial HEP for flexibility and diaphragmatic breathing.  Baseline: Goal status: Met 10/05/23  2.  Patient reports her rectal pain decreased >/= 25% with bowel movements.  Baseline:  Goal status: INITIAL  3.  Patient has been educated on correct ways to have a bowel movement to relax the pelvic floor.  Baseline:  Goal status: Met 10/05/23   LONG TERM GOALS: Target date: 03/15/24  Patient independent with advanced HEP for core and pelvic floor.  Baseline:  Goal status: INITIAL  2.  Patient is able to have a bowel movement with pain level 0-1/10 due to decreased trigger points in the pelvic floor muscles.  Baseline:  Goal status: INITIAL  3.  Patient reports she is not having stool leakage due to the improved contraction of the puborectalis.  Baseline:  Goal status: INITIAL  4.  Patient reports her urinary urgency has decreased >/= 75% due to the reduction of trigger points in the muscles.  Baseline:  Goal status: INITIAL   PLAN:  PT FREQUENCY: 1-2x/week  PT DURATION: 6 months  PLANNED INTERVENTIONS: 97110-Therapeutic exercises,  97530- Therapeutic activity, 97112- Neuromuscular re-education, 97535- Self Care, 60454- Manual therapy, 97014- Electrical stimulation (unattended), 97035- Ultrasound, Patient/Family education, Dry Needling, Joint mobilization, Spinal mobilization, Cryotherapy, Moist heat, and Biofeedback  PLAN FOR NEXT SESSION:   diaphragmatic breathing, abdominal exercises, review toileting, see if last time helped the pelvic floor   Eulis Foster, PT 10/05/23 3:08 PM

## 2023-10-05 NOTE — Progress Notes (Signed)
   Subjective:  Patient Name: Cassandra Hoover, female   DOB: 05/17/1986, 38 y.o.  MRN: [id number]  HPI Patient here for an IUD check, had the Liletta IUD placed 1 month ago.  Reports no issues.  Review of Systems  Constitutional: Negative for fever and chills.  Gastrointestinal: Negative for abdominal pain.  Genitourinary: Negative for vaginal discharge, vaginal pain, pelvic pain and dyspareunia.       Objective:  BP (!) 135/93   Pulse 79   Wt 190 lb 3.2 oz (86.3 kg)   Breastfeeding Yes   BMI 29.79 kg/m   Physical Exam  Constitutional: She appears well-developed and well-nourished.  Abdominal: Soft. There is no tenderness. There is no guarding.  Genitourinary: There is no rash, tenderness or lesion on the right labia. There is no rash, tenderness or lesion on the left labia. No erythema or tenderness in the vagina. No foreign body around the vagina. No signs of injury around the vagina. No vaginal discharge found.    Skin: Skin is warm and dry.  Psychiatric: She has a normal mood and affect. Her behavior is normal. Judgment and thought content normal.       Assessment & Plan:  1. IUD check up - IUD in place without issue.    Follow up in for annual exam or PRN.  Edd Arbour, CNM, MSN, IBCLC Certified Nurse Midwife, [facility]

## 2023-10-06 ENCOUNTER — Other Ambulatory Visit: Payer: Self-pay

## 2023-10-06 ENCOUNTER — Ambulatory Visit: Payer: BC Managed Care – PPO | Admitting: Certified Nurse Midwife

## 2023-10-06 ENCOUNTER — Encounter: Payer: Self-pay | Admitting: Certified Nurse Midwife

## 2023-10-06 VITALS — BP 135/93 | HR 79 | Wt 190.2 lb

## 2023-10-06 DIAGNOSIS — Z30431 Encounter for routine checking of intrauterine contraceptive device: Secondary | ICD-10-CM

## 2023-10-14 ENCOUNTER — Encounter: Attending: Certified Nurse Midwife | Admitting: Physical Therapy

## 2023-10-14 ENCOUNTER — Encounter: Payer: Self-pay | Admitting: Physical Therapy

## 2023-10-14 DIAGNOSIS — R252 Cramp and spasm: Secondary | ICD-10-CM | POA: Diagnosis present

## 2023-10-14 DIAGNOSIS — K6289 Other specified diseases of anus and rectum: Secondary | ICD-10-CM | POA: Diagnosis present

## 2023-10-14 NOTE — Therapy (Signed)
 OUTPATIENT PHYSICAL THERAPY FEMALE PELVIC TREATMENT   Patient Name: Cassandra Hoover MRN: [id number] DOB:1985/09/08, 38 y.o., female Today's Date: 10/14/2023  END OF SESSION:  PT End of Session - 10/14/23 1131     Visit Number 4    Date for PT Re-Evaluation 03/15/24    Authorization Type BC[employer]    PT Start Time 1130    PT Stop Time 1215    PT Time Calculation (min) 45 min    Activity Tolerance Patient tolerated treatment well    Behavior During Therapy [facility] for tasks assessed/performed             Past Medical History:  Diagnosis Date   Anemia    Anxiety    Coarctation of aorta 05/17/2013   Since childhood, Coarct 1.7cmX1.8cm   Coarctation of aorta, congenital    surgical repair at 76 days old and 13 years   Gestational hypertension, third trimester 06/05/2020   Obstetric vaginal laceration with second degree perineal laceration 06/06/2020   Rh negative state in antepartum period 11/27/2019   UTI (urinary tract infection)    Past Surgical History:  Procedure Laterality Date   BREAST ENHANCEMENT SURGERY     COARCTATION OF AORTA REPAIR     REFRACTIVE SURGERY     TONSILLECTOMY     Patient Active Problem List   Diagnosis Date Noted   IUD (intrauterine device) in place 09/10/2023   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   Irritable bowel syndrome 02/17/2023   Generalized anxiety disorder 06/26/2022   Chronic hypertension 07/21/2021   Blood type, Rh negative 11/27/2019   Coarctation of aorta, congenital    Aortic valve disorder 04/27/2013    PCP: Macy Mis, MD  REFERRING PROVIDER: Bernerd Limbo, CNM   REFERRING DIAG: [phone] (ICD-10-CM) - Rectal pain   THERAPY DIAG:  Rectal pain  Cramp and spasm  Rationale for Evaluation and Treatment: Rehabilitation  ONSET DATE: 08/08/24  SUBJECTIVE:                                                                                                                                                                                            SUBJECTIVE STATEMENT: The tissue has more mobility. Rectal pain. I had some leakage at the rectum with some loose stool.     Fluid intake: Yes: water, body armour, liquid IV and gatorade, 1 coffee per day and 2 herbal tea    PAIN:  09/28/23 Are you having pain? Yes NPRS scale: 3/10 Pain location: Anal  Pain type: sharp and throbbing Pain description: intermittent   Aggravating factors: urge to have a bowel movement, during the bowel movement  Relieving factors: Tylenol or Ibuprofen  PRECAUTIONS: None  RED FLAGS: None   WEIGHT BEARING RESTRICTIONS: No  FALLS:  Has patient fallen in last 6 months? No  LIVING ENVIRONMENT: Lives with: lives with their family  OCCUPATION: stay at home mom  PLOF: Independent  PATIENT GOALS: reduce rectal pain and reduce urinary urgency  PERTINENT HISTORY:  Coarctation of aorta repair; IBS   BOWEL MOVEMENT: Pain with bowel movement: Yes Type of bowel movement:Type (Bristol Stool Scale) Type 2 , Frequency sporadic, Strain No, and Splinting no Fully empty rectum: Yes: most of the time Leakage: Yes: small amount , using Dr. Charm Barges fissure ointment Fiber supplement: Yes: using natural fiber  URINATION: Pain with urination: Yes pelvic bone feel sore with opening the pelvis Fully empty bladder: Yes:   Stream: Strong Urgency: Yes:   Frequency: average Leakage: Sneezing forcefully Pads: No  INTERCOURSE: has not tried intercourse yet  PREGNANCY: Vaginal deliveries 2 Tearing Yes: second degree  Currently pregnant No  PROLAPSE: None   OBJECTIVE:  Note: Objective measures were completed at Evaluation unless otherwise noted.  DIAGNOSTIC FINDINGS:  none   COGNITION: Overall cognitive status: Within functional limits for tasks assessed     SENSATION: Light touch: Appears intact Proprioception: Appears intact    PELVIC ALIGNMENT:  LUMBARAROM/PROM: full lumbar ROM   LOWER EXTREMITY GEX:[id number] hip ROM is  full   LOWER EXTREMITY MMT:  MMT Right eval Left eval  Hip extension 4/5 4/5  Hip abduction 4/5 4/5   PALPATION:   General  decreased contraction of the abdominals                External Perineal Exam     tightness in the perineal body                        Internal Pelvic Floor tenderness located in the puborectalis, coccygeus, iliococcygeus  Patient confirms identification and approves PT to assess internal pelvic floor and treatment Yes  PELVIC MMT:   MMT eval 09/28/23 10/05/23 10/14/23  Vaginal 3/5 3/5 4/5   Internal Anal Sphincter 3/5 3/5  4/5  External Anal Sphincter 2/5 3/5  4/5  Puborectalis 2/5 3/5  4/5  Diastasis Recti 2 finger width above and 1 finger width below with good tension     (Blank rows = not tested)         PROLAPSE: Anterior wall weakness 1/2 way in the vaginal canal  TODAY'S TREATMENT:     10/14/23 Manual: Internal pelvic floor techniques: No emotional/communication barriers or cognitive limitation. Patient is motivated to learn. Patient understands and agrees with treatment goals and plan. PT explains patient will be examined in standing, sitting, and lying down to see how their muscles and joints work. When they are ready, they will be asked to remove their underwear so PT can examine their perineum. The patient is also given the option of providing their own chaperone as one is not provided in our facility. The patient also has the right and is explained the right to defer or refuse any part of the evaluation or treatment including the internal exam. With the patient's consent, PT will use one gloved finger to gently assess the muscles of the pelvic floor, seeing how well it contracts and relaxes and if there is muscle symmetry. After, the patient will get dressed and PT and patient will discuss exam findings and plan of care. PT and patient discuss plan of care, schedule, attendance policy  and HEP activities.  Going through the rectum working on the  puborectalis, coccygeus, iliococcygeus, obturator internist, and distraction of the coccyx bone Neuromuscular re-education: Form correction: Plank with tactile cues to engage the abdominals Quadruped hip motion with core engaged Hip flexion isometric alternating Hip flexion isometric with other knee going up and down Side plank on mat then on wall to make it easier working on her core Pelvic floor contraction training: Therapist finger in the rectum working on the anal contraction with lift and puborectalis pulling forward holding for 40 seconds    10/05/23 Manual: Soft tissue mobilization: Manual work to the perineal body, external anal sphincter anteriorly, along the ischiocavernosus, bulbocavernosus, superior transverse perineum, along the clitoral hood Myofascial release: Fascial release of the mons pubis and round ligament Internal pelvic floor techniques: No emotional/communication barriers or cognitive limitation. Patient is motivated to learn. Patient understands and agrees with treatment goals and plan. PT explains patient will be examined in standing, sitting, and lying down to see how their muscles and joints work. When they are ready, they will be asked to remove their underwear so PT can examine their perineum. The patient is also given the option of providing their own chaperone as one is not provided in our facility. The patient also has the right and is explained the right to defer or refuse any part of the evaluation or treatment including the internal exam. With the patient's consent, PT will use one gloved finger to gently assess the muscles of the pelvic floor, seeing how well it contracts and relaxes and if there is muscle symmetry. After, the patient will get dressed and PT and patient will discuss exam findings and plan of care. PT and patient discuss plan of care, schedule, attendance policy and HEP activities.  Going through the vaginal canal working on the pubovaginalis,  puborectalis and pubococcygeus, along the posterior vaginal canal and along the introitus Exercises: Stretches/mobility: Deep squat while holding the door jam to stretch the low back further and deep squat without holding on       09/28/23 Manual: Internal pelvic floor techniques: No emotional/communication barriers or cognitive limitation. Patient is motivated to learn. Patient understands and agrees with treatment goals and plan. PT explains patient will be examined in standing, sitting, and lying down to see how their muscles and joints work. When they are ready, they will be asked to remove their underwear so PT can examine their perineum. The patient is also given the option of providing their own chaperone as one is not provided in our facility. The patient also has the right and is explained the right to defer or refuse any part of the evaluation or treatment including the internal exam. With the patient's consent, PT will use one gloved finger to gently assess the muscles of the pelvic floor, seeing how well it contracts and relaxes and if there is muscle symmetry. After, the patient will get dressed and PT and patient will discuss exam findings and plan of care. PT and patient discuss plan of care, schedule, attendance policy and HEP activities.  Going through the rectum working on the iliococcygeus, along the coccygeus, anococcygeal ligament, puborectalis with hip movements Going through the vaginal canal working on the levator ani and obturator internist with pelvic movements and hip movements.  Neuromuscular re-education: Pelvic floor contraction training: Pelvic floor contraction with finger in the vaginal canal working on lifting of the pelvic floor Therapist finger in the rectum working on anal strength, puborectalis moving forward  and 20 second contractions Exercises: Stretches/mobility: Cat cow 15 times to improve lumbar mobility Childs pose with diaphragmatic breathing Pigeon  pose with knee flexed that is straight Frog stretch Deep squat with yoga block to sit on and perform diaphragmatic breathing.       PATIENT EDUCATION:  09/28/23 Education details: how to massage around the rectum and perineal body; Access Code: [email or web address] Person educated: Patient Education method: Explanation, Demonstration, Tactile cues, Verbal cues, and Handouts Education comprehension: verbalized understanding, returned demonstration, verbal cues required, tactile cues required, and needs further education  HOME EXERCISE PROGRAM: 09/28/23 Access Code: [email or web address] URL: [email or web address] Date: 10/14/2023 Prepared by: Eulis Foster  Exercises - Cat Cow  - 1 x daily - 7 x weekly - 3 sets - 10 reps - Diaphragmatic Breathing in Child's Pose with Pelvic Floor Relaxation  - 1 x daily - 7 x weekly - 1 sets - 10 reps - Pigeon Pose  - 1 x daily - 7 x weekly - 1 sets - 1 reps - 30 sec hold - Quadruped Adductor Stretch  - 1 x daily - 7 x weekly - 1 sets - 1 reps - 30 sec hold - Deep Squat with Pelvic Floor Relaxation  - 1 x daily - 7 x weekly - 1 sets - 1 reps - 30 sec hold - Seated Pelvic Floor Contraction  - 3 x daily - 7 x weekly - 1 sets - 10 reps - 10 sec hold - Hooklying Isometric Hip Flexion  - 1 x daily - 7 x weekly - 3 sets - 10 reps - Standing Side Plank on Wall  - 1 x daily - 3 x weekly - 1 sets - 10 reps - Bear Plank from [employer]  - 1 x daily - 7 x weekly - 1 sets - 10 reps Access Code: [email or web address] URL: [email or web address] Date: 09/28/2023 Prepared by: Eulis Foster  Exercises - Cat Cow  - 1 x daily - 7 x weekly - 3 sets - 10 reps - Diaphragmatic Breathing in Child's Pose with Pelvic Floor Relaxation  - 1 x daily - 7 x weekly - 1 sets - 10 reps - Pigeon Pose  - 1 x daily - 7 x weekly - 1 sets - 1 reps - 30 sec hold - Quadruped Adductor Stretch  - 1 x daily - 7 x weekly - 1 sets - 1 reps - 30 sec hold - Deep Squat with Pelvic Floor Relaxation  - 1 x  daily - 7 x weekly - 1 sets - 1 reps - 30 sec hold - Seated Pelvic Floor Contraction  - 3 x daily - 7 x weekly - 1 sets - 10 reps - 10 sec hold  ASSESSMENT:  CLINICAL IMPRESSION: Patient is a 38 y.o. female who was seen today for physical therapy  treatment for rectal pain.  She delivered her last child vaginally on 08/05/23. Rectal pain decreased by 25%. Her rectal strength increased to 4/5 with good movement of the puborectalis. She is learning how to not bulge he abdomen with core exercises and had to modify them. She does strain to have a bowel movement at times. She had one episode of stool leakage last week.  Patient will benefit from skilled therapy to improve mobility of the tissue and reduce pain to improve quality of life.   OBJECTIVE IMPAIRMENTS: decreased coordination, decreased strength, increased fascial restrictions, increased muscle spasms, and pain.   ACTIVITY LIMITATIONS: toileting  PARTICIPATION LIMITATIONS: driving, shopping, and community activity  PERSONAL FACTORS: Time since onset of injury/illness/exacerbation are also affecting patient's functional outcome.   REHAB POTENTIAL: Excellent  CLINICAL DECISION MAKING: Evolving/moderate complexity  EVALUATION COMPLEXITY: Moderate   GOALS: Goals reviewed with patient? Yes  SHORT TERM GOALS: Target date: 10/14/23  Patient independent with initial HEP for flexibility and diaphragmatic breathing.  Baseline: Goal status: Met 10/05/23  2.  Patient reports her rectal pain decreased >/= 25% with bowel movements.  Baseline:  Goal status: Met 10/14/23  3.  Patient has been educated on correct ways to have a bowel movement to relax the pelvic floor.  Baseline:  Goal status: Met 10/05/23   LONG TERM GOALS: Target date: 03/15/24  Patient independent with advanced HEP for core and pelvic floor.  Baseline:  Goal status: INITIAL  2.  Patient is able to have a bowel movement with pain level 0-1/10 due to decreased trigger  points in the pelvic floor muscles.  Baseline:  Goal status: INITIAL  3.  Patient reports she is not having stool leakage due to the improved contraction of the puborectalis.  Baseline:  Goal status: INITIAL  4.  Patient reports her urinary urgency has decreased >/= 75% due to the reduction of trigger points in the muscles.  Baseline:  Goal status: INITIAL   PLAN:  PT FREQUENCY: 1-2x/week  PT DURATION: 6 months  PLANNED INTERVENTIONS: 97110-Therapeutic exercises, 97530- Therapeutic activity, 97112- Neuromuscular re-education, 97535- Self Care, 16109- Manual therapy, 97014- Electrical stimulation (unattended), 97035- Ultrasound, Patient/Family education, Dry Needling, Joint mobilization, Spinal mobilization, Cryotherapy, Moist heat, and Biofeedback  PLAN FOR NEXT SESSION:   diaphragmatic breathing, abdominal exercises, urge to void   Eulis Foster, PT 10/14/23 12:58 PM

## 2023-10-18 ENCOUNTER — Encounter: Payer: Self-pay | Admitting: Certified Nurse Midwife

## 2023-10-18 DIAGNOSIS — R1030 Lower abdominal pain, unspecified: Secondary | ICD-10-CM

## 2023-10-18 MED ORDER — AMLODIPINE BESYLATE 10 MG PO TABS: 10.0000 mg | ORAL_TABLET | Freq: Every day | ORAL | 2 refills | Status: DC

## 2023-10-21 ENCOUNTER — Encounter: Payer: Self-pay | Admitting: Physical Therapy

## 2023-10-21 DIAGNOSIS — R252 Cramp and spasm: Secondary | ICD-10-CM

## 2023-10-21 DIAGNOSIS — K6289 Other specified diseases of anus and rectum: Secondary | ICD-10-CM

## 2023-10-21 NOTE — Therapy (Signed)
 OUTPATIENT PHYSICAL THERAPY FEMALE PELVIC TREATMENT   Patient Name: Cassandra Hoover MRN: [id number] DOB:[birth year], 38 y.o., female Today's Date: 10/21/2023  END OF SESSION:  PT End of Session - 10/21/23 0943     Visit Number 5    Date for PT Re-Evaluation 03/15/24    Authorization Type [employer]    PT Start Time 0940   came late   PT Stop Time 1020    PT Time Calculation (min) 40 min    Activity Tolerance Patient tolerated treatment well    Behavior During Therapy [facility] for tasks assessed/performed             Past Medical History:  Diagnosis Date   Anemia    Anxiety    Coarctation of aorta 05/17/2013   Since childhood, Coarct 1.7cmX1.8cm   Coarctation of aorta, 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   Gestational hypertension, third trimester 06/05/2020   Obstetric vaginal laceration with second degree perineal laceration 06/06/2020   Rh negative state in antepartum period 11/27/2019   UTI (urinary tract infection)    Past Surgical History:  Procedure Laterality Date   BREAST ENHANCEMENT SURGERY     COARCTATION OF AORTA REPAIR     REFRACTIVE SURGERY     TONSILLECTOMY     Patient Active Problem List   Diagnosis Date Noted   IUD (intrauterine device) in place 09/10/2023   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   Irritable bowel syndrome 02/17/2023   Generalized anxiety disorder 06/26/2022   Chronic hypertension 07/21/2021   Blood type, Rh negative 11/27/2019   Coarctation of aorta, congenital    Aortic valve disorder 04/27/2013    PCP: Macy Mis, MD  REFERRING PROVIDER: Bernerd Limbo, CNM   REFERRING DIAG: [phone] (ICD-10-CM) - Rectal pain   THERAPY DIAG:  Rectal pain  Cramp and spasm  Rationale for Evaluation and Treatment: Rehabilitation  ONSET DATE: 08/08/24  SUBJECTIVE:                                                                                                                                                                                            SUBJECTIVE STATEMENT: The rectal pain is not bad.I am on a regular schedule for the bowel movements. I have not any pain since last visit. When I have to hold my stool due to being out and will feel a pressure on the uterine wall side. I get the pain above the bladder and it is center and sharp.    Fluid intake: Yes: water, body armour, liquid IV and gatorade, 1 coffee per day and 2 herbal tea  PAIN:  09/28/23 Are you having pain? Yes NPRS scale: 3/10 10/21/23 0/10 Pain location: Anal  Pain type: sharp and throbbing Pain description: intermittent   Aggravating factors: urge to have a bowel movement, during the bowel movement   Relieving factors: Tylenol or Ibuprofen  PRECAUTIONS: None  RED FLAGS: None   WEIGHT BEARING RESTRICTIONS: No  FALLS:  Has patient fallen in last 6 months? No  LIVING ENVIRONMENT: Lives with: lives with their family  OCCUPATION: stay at home mom  PLOF: Independent  PATIENT GOALS: reduce rectal pain and reduce urinary urgency  PERTINENT HISTORY:  Coarctation of aorta repair; IBS   BOWEL MOVEMENT: Pain with bowel movement: Yes Type of bowel movement:Type (Bristol Stool Scale) Type 2 , Frequency sporadic, Strain No, and Splinting no Fully empty rectum: Yes: most of the time Leakage: Yes: small amount , using Dr. Charm Barges fissure ointment Fiber supplement: Yes: using natural fiber  URINATION: Pain with urination: Yes pelvic bone feel sore with opening the pelvis Fully empty bladder: Yes:   Stream: Strong Urgency: Yes:   Frequency: average Leakage: Sneezing forcefully Pads: No  INTERCOURSE: has not tried intercourse yet  PREGNANCY: Vaginal deliveries 2 Tearing Yes: second degree  Currently pregnant No  PROLAPSE: None   OBJECTIVE:  Note: Objective measures were completed at Evaluation unless otherwise noted.  DIAGNOSTIC FINDINGS:  none   COGNITION: Overall cognitive status: Within functional limits for tasks  assessed     SENSATION: Light touch: Appears intact Proprioception: Appears intact    PELVIC ALIGNMENT:  LUMBARAROM/PROM: full lumbar ROM   LOWER EXTREMITY ZOX:[id number] hip ROM is full   LOWER EXTREMITY MMT:  MMT Right eval Left eval  Hip extension 4/5 4/5  Hip abduction 4/5 4/5   PALPATION:   General  decreased contraction of the abdominals                External Perineal Exam     tightness in the perineal body                        Internal Pelvic Floor tenderness located in the puborectalis, coccygeus, iliococcygeus  Patient confirms identification and approves PT to assess internal pelvic floor and treatment Yes  PELVIC MMT:   MMT eval 09/28/23 10/05/23 10/14/23 10/21/23  Vaginal 3/5 3/5 4/5  4/5  Internal Anal Sphincter 3/5 3/5  4/5   External Anal Sphincter 2/5 3/5  4/5   Puborectalis 2/5 3/5  4/5   Diastasis Recti 2 finger width above and 1 finger width below with good tension      (Blank rows = not tested)         PROLAPSE: Anterior wall weakness 1/2 way in the vaginal canal  TODAY'S TREATMENT:     10/21/23 Manual: Internal pelvic floor techniques: No emotional/communication barriers or cognitive limitation. Patient is motivated to learn. Patient understands and agrees with treatment goals and plan. PT explains patient will be examined in standing, sitting, and lying down to see how their muscles and joints work. When they are ready, they will be asked to remove their underwear so PT can examine their perineum. The patient is also given the option of providing their own chaperone as one is not provided in our facility. The patient also has the right and is explained the right to defer or refuse any part of the evaluation or treatment including the internal exam. With the patient's consent, PT will use one gloved finger to gently assess  the muscles of the pelvic floor, seeing how well it contracts and relaxes and if there is muscle symmetry. After, the patient  will get dressed and PT and patient will discuss exam findings and plan of care. PT and patient discuss plan of care, schedule, attendance policy and HEP activities.  Going through the vaginal canal working on  the left side of the cervix for mobility and above the bladder to work on the urachus ligament while the outside hand is manipulating the bladder and cervix.  Exercises: Strengthening: Hip flexion isometric alternating with keeping the back flat and lower abdominal engaged Single leg bridge keeping pelvis flat Supine move trunk side to side to engage the obliques Deep squat with knees before toes Side plank on wall and counter top to engage the obliques    10/14/23 Manual: Internal pelvic floor techniques: No emotional/communication barriers or cognitive limitation. Patient is motivated to learn. Patient understands and agrees with treatment goals and plan. PT explains patient will be examined in standing, sitting, and lying down to see how their muscles and joints work. When they are ready, they will be asked to remove their underwear so PT can examine their perineum. The patient is also given the option of providing their own chaperone as one is not provided in our facility. The patient also has the right and is explained the right to defer or refuse any part of the evaluation or treatment including the internal exam. With the patient's consent, PT will use one gloved finger to gently assess the muscles of the pelvic floor, seeing how well it contracts and relaxes and if there is muscle symmetry. After, the patient will get dressed and PT and patient will discuss exam findings and plan of care. PT and patient discuss plan of care, schedule, attendance policy and HEP activities.  Going through the rectum working on the puborectalis, coccygeus, iliococcygeus, obturator internist, and distraction of the coccyx bone Neuromuscular re-education: Form correction: Plank with tactile cues to engage  the abdominals Quadruped hip motion with core engaged Hip flexion isometric alternating Hip flexion isometric with other knee going up and down Side plank on mat then on wall to make it easier working on her core Pelvic floor contraction training: Therapist finger in the rectum working on the anal contraction with lift and puborectalis pulling forward holding for 40 seconds    10/05/23 Manual: Soft tissue mobilization: Manual work to the perineal body, external anal sphincter anteriorly, along the ischiocavernosus, bulbocavernosus, superior transverse perineum, along the clitoral hood Myofascial release: Fascial release of the mons pubis and round ligament Internal pelvic floor techniques: No emotional/communication barriers or cognitive limitation. Patient is motivated to learn. Patient understands and agrees with treatment goals and plan. PT explains patient will be examined in standing, sitting, and lying down to see how their muscles and joints work. When they are ready, they will be asked to remove their underwear so PT can examine their perineum. The patient is also given the option of providing their own chaperone as one is not provided in our facility. The patient also has the right and is explained the right to defer or refuse any part of the evaluation or treatment including the internal exam. With the patient's consent, PT will use one gloved finger to gently assess the muscles of the pelvic floor, seeing how well it contracts and relaxes and if there is muscle symmetry. After, the patient will get dressed and PT and patient will discuss exam findings and plan  of care. PT and patient discuss plan of care, schedule, attendance policy and HEP activities.  Going through the vaginal canal working on the pubovaginalis, puborectalis and pubococcygeus, along the posterior vaginal canal and along the introitus Exercises: Stretches/mobility: Deep squat while holding the door jam to stretch the  low back further and deep squat without holding on        PATIENT EDUCATION:  10/21/23 Education details: how to massage around the rectum and perineal body; Access Code: [email or web address] Person educated: Patient Education method: Explanation, Demonstration, Tactile cues, Verbal cues, and Handouts Education comprehension: verbalized understanding, returned demonstration, verbal cues required, tactile cues required, and needs further education  HOME EXERCISE PROGRAM: 10/21/23 Access Code: [email or web address] URL: [email or web address] Date: 10/21/2023 Prepared by: Eulis Foster  Program Notes supine penguin 20 x  Exercises - Cat Cow  - 1 x daily - 7 x weekly - 3 sets - 10 reps - Diaphragmatic Breathing in Child's Pose with Pelvic Floor Relaxation  - 1 x daily - 7 x weekly - 1 sets - 10 reps - Pigeon Pose  - 1 x daily - 7 x weekly - 1 sets - 1 reps - 30 sec hold - Quadruped Adductor Stretch  - 1 x daily - 7 x weekly - 1 sets - 1 reps - 30 sec hold - Deep Squat with Pelvic Floor Relaxation  - 1 x daily - 7 x weekly - 1 sets - 1 reps - 30 sec hold - Seated Pelvic Floor Contraction  - 3 x daily - 7 x weekly - 1 sets - 10 reps - 10 sec hold - Hooklying Isometric Hip Flexion  - 1 x daily - 7 x weekly - 3 sets - 10 reps - Standing Side Plank on Wall  - 1 x daily - 3 x weekly - 1 sets - 10 reps - Bear Plank from [employer]  - 1 x daily - 7 x weekly - 1 sets - 10 reps - Single Leg Bridge  - 1 x daily - 3 x weekly - 1 sets - 10 reps - Quadruped Pelvic Floor Contraction with Opposite Arm and Leg Lift  - 1 x daily - 3 x weekly - 2 sets - 10 reps  ASSESSMENT:  CLINICAL IMPRESSION: Patient is a 38 y.o. female who was seen today for physical therapy  treatment for rectal pain.  She delivered her last child vaginally on 08/05/23. No rectal pain since last visit. No stool leakage since 3//1/25.   Vaginal strength is 4/5. She is working hard with engaging her core with her exercises. She had some tightness  in the superior bladder area but was resolved after the manual work. Patient will benefit from skilled therapy to improve mobility of the tissue and reduce pain to improve quality of life.   OBJECTIVE IMPAIRMENTS: decreased coordination, decreased strength, increased fascial restrictions, increased muscle spasms, and pain.   ACTIVITY LIMITATIONS: toileting  PARTICIPATION LIMITATIONS: driving, shopping, and community activity  PERSONAL FACTORS: Time since onset of injury/illness/exacerbation are also affecting patient's functional outcome.   REHAB POTENTIAL: Excellent  CLINICAL DECISION MAKING: Evolving/moderate complexity  EVALUATION COMPLEXITY: Moderate   GOALS: Goals reviewed with patient? Yes  SHORT TERM GOALS: Target date: 10/14/23  Patient independent with initial HEP for flexibility and diaphragmatic breathing.  Baseline: Goal status: Met 10/05/23  2.  Patient reports her rectal pain decreased >/= 25% with bowel movements.  Baseline:  Goal status: Met 10/14/23  3.  Patient has been educated on correct ways  to have a bowel movement to relax the pelvic floor.  Baseline:  Goal status: Met 10/05/23   LONG TERM GOALS: Target date: 03/15/24  Patient independent with advanced HEP for core and pelvic floor.  Baseline:  Goal status: INITIAL  2.  Patient is able to have a bowel movement with pain level 0-1/10 due to decreased trigger points in the pelvic floor muscles.  Baseline:  Goal status: INITIAL  3.  Patient reports she is not having stool leakage due to the improved contraction of the puborectalis.  Baseline:  Goal status: INITIAL  4.  Patient reports her urinary urgency has decreased >/= 75% due to the reduction of trigger points in the muscles.  Baseline:  Goal status: INITIAL   PLAN:  PT FREQUENCY: 1-2x/week  PT DURATION: 6 months  PLANNED INTERVENTIONS: 97110-Therapeutic exercises, 97530- Therapeutic activity, 97112- Neuromuscular re-education, 97535- Self  Care, 16109- Manual therapy, 97014- Electrical stimulation (unattended), 97035- Ultrasound, Patient/Family education, Dry Needling, Joint mobilization, Spinal mobilization, Cryotherapy, Moist heat, and Biofeedback  PLAN FOR NEXT SESSION:  If doing well at next visit then discharge   Eulis Foster, PT 10/21/23 10:24 AM

## 2023-10-25 ENCOUNTER — Encounter: Payer: Self-pay | Admitting: Physician Assistant

## 2023-11-03 NOTE — Telephone Encounter (Signed)
 Spoke to patient she stated she is not taking Labetalol,She is breast feeding.She has appointment with Dr.Tobb 4/1 at 9:20 am.Advised I will message to Dr.Tobb.

## 2023-11-04 ENCOUNTER — Encounter: Payer: Self-pay | Admitting: Physical Therapy

## 2023-11-04 DIAGNOSIS — K6289 Other specified diseases of anus and rectum: Secondary | ICD-10-CM | POA: Diagnosis not present

## 2023-11-04 DIAGNOSIS — R252 Cramp and spasm: Secondary | ICD-10-CM

## 2023-11-04 NOTE — Therapy (Signed)
 OUTPATIENT PHYSICAL THERAPY FEMALE PELVIC TREATMENT   Patient Name: Cassandra Hoover MRN: [id number] DOB:1985/09/13, 38 y.o., female Today's Date: 11/04/2023  END OF SESSION:  PT End of Session - 11/04/23 0938     Visit Number 6    Date for PT Re-Evaluation 03/15/24    Authorization Type BCBS    PT Start Time 0930    PT Stop Time 1015    PT Time Calculation (min) 45 min    Activity Tolerance Patient tolerated treatment well    Behavior During Therapy [facility] for tasks assessed/performed             Past Medical History:  Diagnosis Date   Anemia    Anxiety    Coarctation of aorta 05/17/2013   Since childhood, Coarct 1.7cmX1.8cm   Coarctation of aorta, congenital    surgical repair at 3 days old and 13 years   Gestational hypertension, third trimester 06/05/2020   Obstetric vaginal laceration with second degree perineal laceration 06/06/2020   Rh negative state in antepartum period 11/27/2019   UTI (urinary tract infection)    Past Surgical History:  Procedure Laterality Date   BREAST ENHANCEMENT SURGERY     COARCTATION OF AORTA REPAIR     REFRACTIVE SURGERY     TONSILLECTOMY     Patient Active Problem List   Diagnosis Date Noted   IUD (intrauterine device) in place 09/10/2023   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   Irritable bowel syndrome 02/17/2023   Generalized anxiety disorder 06/26/2022   Chronic hypertension 07/21/2021   Blood type, Rh negative 11/27/2019   Coarctation of aorta, congenital    Aortic valve disorder 04/27/2013    PCP: Macy Mis, MD  REFERRING PROVIDER: Bernerd Limbo, CNM   REFERRING DIAG: [phone] (ICD-10-CM) - Rectal pain   THERAPY DIAG:  Rectal pain  Cramp and spasm  Rationale for Evaluation and Treatment: Rehabilitation  ONSET DATE: 08/08/24  SUBJECTIVE:                                                                                                                                                                                            SUBJECTIVE STATEMENT: I am feeling good.  Fluid intake: Yes: water, body armour, liquid IV and gatorade, 1 coffee per day and 2 herbal tea    PAIN:  09/28/23 Are you having pain? Yes NPRS scale: 3/10 10/21/23 0/10 11/04/23: 0/10 Pain location: Anal  Pain type: sharp and throbbing Pain description: intermittent   Aggravating factors: urge to have a bowel movement, during the bowel movement   Relieving factors: Tylenol or Ibuprofen  PRECAUTIONS: None  RED FLAGS: None  WEIGHT BEARING RESTRICTIONS: No  FALLS:  Has patient fallen in last 6 months? No  LIVING ENVIRONMENT: Lives with: lives with their family  OCCUPATION: stay at home mom  PLOF: Independent  PATIENT GOALS: reduce rectal pain and reduce urinary urgency  PERTINENT HISTORY:  Coarctation of aorta repair; IBS   BOWEL MOVEMENT: Pain with bowel movement: Yes Type of bowel movement:Type (Bristol Stool Scale) Type 2 , Frequency sporadic, Strain No, and Splinting no Fully empty rectum: Yes: most of the time Leakage: Yes: small amount , using Dr. Charm Barges fissure ointment 11/04/23: No urinary leakage Fiber supplement: Yes: using natural fiber  URINATION: Pain with urination: Yes pelvic bone feel sore with opening the pelvis Fully empty bladder: Yes:   Stream: Strong Urgency: Yes:   Frequency: average Leakage: Sneezing forcefully Pads: No  INTERCOURSE: has not tried intercourse yet  PREGNANCY: Vaginal deliveries 2 Tearing Yes: second degree  Currently pregnant No  PROLAPSE: None   OBJECTIVE:  Note: Objective measures were completed at Evaluation unless otherwise noted.  DIAGNOSTIC FINDINGS:  none   COGNITION: Overall cognitive status: Within functional limits for tasks assessed     SENSATION: Light touch: Appears intact Proprioception: Appears intact    PELVIC ALIGNMENT:  LUMBARAROM/PROM: full lumbar ROM   LOWER EXTREMITY MVH:QIONGEXBM hip ROM is full   LOWER EXTREMITY  MMT:  MMT Right eval Left eval Right/Left 11/04/23  Hip extension 4/5 4/5 4+/5  Hip abduction 4/5 4/5 4+/5   PALPATION:   General  decreased contraction of the abdominals                External Perineal Exam     tightness in the perineal body                        Internal Pelvic Floor tenderness located in the puborectalis, coccygeus, iliococcygeus  Patient confirms identification and approves PT to assess internal pelvic floor and treatment Yes  PELVIC MMT:   MMT eval 09/28/23 10/05/23 10/14/23 10/21/23  Vaginal 3/5 3/5 4/5  4/5  Internal Anal Sphincter 3/5 3/5  4/5   External Anal Sphincter 2/5 3/5  4/5   Puborectalis 2/5 3/5  4/5   Diastasis Recti 2 finger width above and 1 finger width below with good tension    0.5 finger width above and no finger width below with good tension  (Blank rows = not tested)         PROLAPSE: Anterior wall weakness 1/2 way in the vaginal canal  TODAY'S TREATMENT:     11/04/23 Exercises: Strengthening: Supine lift leg and arm to work obliques 10 x each with tactile cues to contract the abdominals Figure 4 bridge with core engaged 2 x 10  Quadruped lift alternate extremity with keeping the abdominals engaged and not lift one side of the pelvis Bear plank Quadruped push ups for triceps 10 x then wide arms for pectoralis Lunges keeping the pelvis leveled and not letting the foot go over the toes Squatting keeping the hips back Off set squat to focus more on one leg strength    10/21/23 Manual: Internal pelvic floor techniques: No emotional/communication barriers or cognitive limitation. Patient is motivated to learn. Patient understands and agrees with treatment goals and plan. PT explains patient will be examined in standing, sitting, and lying down to see how their muscles and joints work. When they are ready, they will be asked to remove their underwear so PT can examine their perineum. The  patient is also given the option of providing  their own chaperone as one is not provided in our facility. The patient also has the right and is explained the right to defer or refuse any part of the evaluation or treatment including the internal exam. With the patient's consent, PT will use one gloved finger to gently assess the muscles of the pelvic floor, seeing how well it contracts and relaxes and if there is muscle symmetry. After, the patient will get dressed and PT and patient will discuss exam findings and plan of care. PT and patient discuss plan of care, schedule, attendance policy and HEP activities.  Going through the vaginal canal working on  the left side of the cervix for mobility and above the bladder to work on the urachus ligament while the outside hand is manipulating the bladder and cervix.  Exercises: Strengthening: Hip flexion isometric alternating with keeping the back flat and lower abdominal engaged Single leg bridge keeping pelvis flat Supine move trunk side to side to engage the obliques Deep squat with knees before toes Side plank on wall and counter top to engage the obliques    10/14/23 Manual: Internal pelvic floor techniques: No emotional/communication barriers or cognitive limitation. Patient is motivated to learn. Patient understands and agrees with treatment goals and plan. PT explains patient will be examined in standing, sitting, and lying down to see how their muscles and joints work. When they are ready, they will be asked to remove their underwear so PT can examine their perineum. The patient is also given the option of providing their own chaperone as one is not provided in our facility. The patient also has the right and is explained the right to defer or refuse any part of the evaluation or treatment including the internal exam. With the patient's consent, PT will use one gloved finger to gently assess the muscles of the pelvic floor, seeing how well it contracts and relaxes and if there is muscle symmetry.  After, the patient will get dressed and PT and patient will discuss exam findings and plan of care. PT and patient discuss plan of care, schedule, attendance policy and HEP activities.  Going through the rectum working on the puborectalis, coccygeus, iliococcygeus, obturator internist, and distraction of the coccyx bone Neuromuscular re-education: Form correction: Plank with tactile cues to engage the abdominals Quadruped hip motion with core engaged Hip flexion isometric alternating Hip flexion isometric with other knee going up and down Side plank on mat then on wall to make it easier working on her core Pelvic floor contraction training: Therapist finger in the rectum working on the anal contraction with lift and puborectalis pulling forward holding for 40 seconds        PATIENT EDUCATION:  11/04/23 Education details: how to massage around the rectum and perineal body; Access Code: [email or web address] Person educated: Patient Education method: Explanation, Demonstration, Tactile cues, Verbal cues, and Handouts Education comprehension: verbalized understanding, returned demonstration, verbal cues required, tactile cues required, and needs further education  HOME EXERCISE PROGRAM: 11/04/23 Access Code: [email or web address] URL: [email or web address] Date: 11/04/2023 Prepared by: Eulis Foster  Program Notes supine penguin 20 x  Exercises - Cat Cow  - 1 x daily - 7 x weekly - 3 sets - 10 reps - Diaphragmatic Breathing in Child's Pose with Pelvic Floor Relaxation  - 1 x daily - 7 x weekly - 1 sets - 10 reps - Pigeon Pose  - 1 x daily - 7 x weekly - 1 sets -  1 reps - 30 sec hold - Quadruped Adductor Stretch  - 1 x daily - 7 x weekly - 1 sets - 1 reps - 30 sec hold - Deep Squat with Pelvic Floor Relaxation  - 1 x daily - 7 x weekly - 1 sets - 1 reps - 30 sec hold - Seated Pelvic Floor Contraction  - 3 x daily - 7 x weekly - 1 sets - 10 reps - 10 sec hold - Standing Side Plank on Wall  - 1 x  daily - 3 x weekly - 1 sets - 10 reps - Bear Plank from [employer]  - 1 x daily - 7 x weekly - 1 sets - 10 reps - Quadruped Pelvic Floor Contraction with Opposite Arm and Leg Lift  - 1 x daily - 3 x weekly - 2 sets - 10 reps - Figure 4 Bridge  - 1 x daily - 3 x weekly - 2 sets - 10 reps - Quadruped Push Up with Baby  - 1 x daily - 7 x weekly - 3 sets - 10 reps - Partial Lunges with Baby  - 1 x daily - 7 x weekly - 3 sets - 10 reps - Staggered Stance Squat  - 1 x daily - 7 x weekly - 1 sets - 10 reps  ASSESSMENT:  CLINICAL IMPRESSION: Patient is a 38 y.o. female who was seen today for physical therapy  treatment for rectal pain.  No rectal pain is present. No urinary leakage. She does not have the urgency. Reduction of diastasis recti.  Increased in bilateral hip strength. Pelvic floor strength is 4/5. She is understanding how to exercise with correct core engagement and pelvic floor contraction. She has met all of her goals.   OBJECTIVE IMPAIRMENTS: decreased coordination, decreased strength, increased fascial restrictions, increased muscle spasms, and pain.   ACTIVITY LIMITATIONS: toileting  PARTICIPATION LIMITATIONS: driving, shopping, and community activity  PERSONAL FACTORS: Time since onset of injury/illness/exacerbation are also affecting patient's functional outcome.   REHAB POTENTIAL: Excellent  CLINICAL DECISION MAKING: Evolving/moderate complexity  EVALUATION COMPLEXITY: Moderate   GOALS: Goals reviewed with patient? Yes  SHORT TERM GOALS: Target date: 10/14/23  Patient independent with initial HEP for flexibility and diaphragmatic breathing.  Baseline: Goal status: Met 10/05/23  2.  Patient reports her rectal pain decreased >/= 25% with bowel movements.  Baseline:  Goal status: Met 10/14/23  3.  Patient has been educated on correct ways to have a bowel movement to relax the pelvic floor.  Baseline:  Goal status: Met 10/05/23   LONG TERM GOALS: Target date:  03/15/24  Patient independent with advanced HEP for core and pelvic floor.  Baseline:  Goal status: Met 11/04/23  2.  Patient is able to have a bowel movement with pain level 0-1/10 due to decreased trigger points in the pelvic floor muscles.  Baseline:  Goal status: Met 11/04/23  3.  Patient reports she is not having stool leakage due to the improved contraction of the puborectalis.  Baseline:  Goal status: Met 11/04/23  4.  Patient reports her urinary urgency has decreased >/= 75% due to the reduction of trigger points in the muscles.  Baseline:  Goal status: Met 11/04/23   PLAN: Discharge to HEP this visit  Eulis Foster, PT 11/04/23 10:21 AM  PHYSICAL THERAPY DISCHARGE SUMMARY  Visits from Start of Care: 6  Current functional level related to goals / functional outcomes: See above.    Remaining deficits: See above.  Education / Equipment: HEP   Patient agrees to discharge. Patient goals were met. Patient is being discharged due to meeting the stated rehab goals. Thank you for the referral .   Eulis Foster, PT 11/04/23 10:21 AM

## 2023-11-09 ENCOUNTER — Ambulatory Visit: Payer: BC Managed Care – PPO | Attending: Cardiology | Admitting: Cardiology

## 2023-11-09 ENCOUNTER — Encounter: Payer: Self-pay | Admitting: Cardiology

## 2023-11-09 VITALS — BP 120/80 | HR 82 | Ht 67.0 in | Wt 195.0 lb

## 2023-11-09 DIAGNOSIS — Q251 Coarctation of aorta: Secondary | ICD-10-CM | POA: Diagnosis not present

## 2023-11-09 DIAGNOSIS — Z8759 Personal history of other complications of pregnancy, childbirth and the puerperium: Secondary | ICD-10-CM

## 2023-11-09 DIAGNOSIS — O165 Unspecified maternal hypertension, complicating the puerperium: Secondary | ICD-10-CM

## 2023-11-09 NOTE — Patient Instructions (Signed)
 Medication Instructions:  Your physician recommends that you continue on your current medications as directed. Please refer to the Current Medication list given to you today.  *If you need a refill on your cardiac medications before your next appointment, please call your pharmacy*  Follow-Up: At [facility], you and your health needs are our priority.  As part of our continuing mission to provide you with exceptional heart care, our providers are all part of one team.  This team includes your primary Cardiologist (physician) and Advanced Practice Providers or APPs (Physician Assistants and Nurse Practitioners) who all work together to provide you with the care you need, when you need it.  Your next appointment:   12-16 week(s)  Provider:   Thomasene Ripple, DO    Other Instructions   1st Floor: - Lobby - Registration  - Pharmacy  - Lab - Cafe  2nd Floor: - PV Lab - Diagnostic Testing (echo, CT, nuclear med)  3rd Floor: - Vacant  4th Floor: - TCTS (cardiothoracic surgery) - AFib Clinic - Structural Heart Clinic - Vascular Surgery  - Vascular Ultrasound  5th Floor: - HeartCare Cardiology (general and EP) - Clinical Pharmacy for coumadin, hypertension, lipid, weight-loss medications, and med management appointments    Valet parking services will be available as well.

## 2023-11-10 NOTE — Progress Notes (Signed)
 Cardio-Obstetrics Clinic  New Evaluation  Date:  11/10/2023   ID:  Cassandra Hoover, DOB July 05, 1986, MRN [id number]  PCP:  Macy Mis, MD   [facility] Providers Cardiologist:  Jodelle Red, MD  Electrophysiologist:  None       Referring MD: Macy Mis, MD   Chief Complaint: " I am doing well"   History of Present Illness:    Cassandra Hoover is a 38 y.o. female [G2P2002] with hx of includes coarctation of the aorta follows cardiovascular care in pregnancy.  Hx of hypertensive disorder in pregnancy, coarctation of the aorta.   She is post partum - we have adjusted her medications for her blood pressure since delivery.   Today she tells me that she is only taking the amlodipine.   Discussed the use of AI scribe software for clinical note transcription with the patient, who gave verbal consent to proceed.   The patient, with a history of hypertension, presents for a follow-up visit. She reports that her blood pressure has been 'pretty good' and 'in the 120s' since starting Norvasc. She has noticed a significant improvement in her symptoms, stating 'Is this what normal people feel like?' She denies any side effects from the medication and expresses relief that her blood pressure is better controlled. She also mentions that she has been trying to stay active and healthy.        Prior CV Studies Reviewed: The following studies were reviewed today: Cardiac MR 09/2019 CONTRAST:  [id number]   FINDINGS: Normal LA/RA size. Normal RV size and function No ASD/PFO/VSD. No pericardial effusion normal MV,TV. Aortic valve is tri-leaflet with no significant stenosis or regurgitation. Normal LV size and function. Quantitative EF 59% (EDV 114 cc ESV 47 cc SV 68 cc) Septal thickness normal 8 mm no LVH. Delayed gadolinium images showed no hyper-enhancement of the LV myocardium   Aorta: There is fusiform relative dilatation of the ascending aortic root. The great vessels  exit the arch normally The arch itself is relatively hypoplastic. Just distal to the left subclavian take off there is residual "pseudo coarctation" at the likely sight of previous repair. This area narrows down to a diameter of 13 mm. Hoover analysis through this area precisely was not performed   Aortic Sinus: 28 mm   STJ 25 mm   Aortic Root: 34 mm   Aortic Arch 18 mm   Pseudo Coarctation: 13 mm just distal to left subclavian take off   Proximal descending thoracic aorta 24 mm   Distal descending thoracic aorta 17 mm   IMPRESSION: 1. Normal tri- leaflet aortic valve   2. Relative dilatation of the ascending aortic root at 34 mm with hypoplastic arch 17 mm. Residual "pseudo-coarctation" likely at site of previous repair with residual minimal luminal diameter of 13 mm. Hoover analysis through the smallest area not performed   3.  Normal LV size and function EF 59%   4.  No delayed LV myocardial enhancement post gadolinium  Past Medical History:  Diagnosis Date   Anemia    Anxiety    Coarctation of aorta 05/17/2013   Since childhood, Coarct 1.7cmX1.8cm   Coarctation of aorta, congenital    surgical repair at 97 days old and 13 years   Gestational hypertension, third trimester 06/05/2020   Obstetric vaginal laceration with second degree perineal laceration 06/06/2020   Rh negative state in antepartum period 11/27/2019   UTI (urinary tract infection)     Past Surgical History:  Procedure Laterality Date  BREAST ENHANCEMENT SURGERY     COARCTATION OF AORTA REPAIR     REFRACTIVE SURGERY     TONSILLECTOMY        OB History     Gravida  2   Para  2   Term  2   Preterm      AB      Living  2      SAB      IAB      Ectopic      Multiple  0   Live Births  2               Current Medications: Current Meds  Medication Sig   amLODipine (NORVASC) 10 MG tablet Take 1 tablet (10 mg total) by mouth daily.   Blood Pressure Monitoring (BLOOD  PRESSURE MONITOR AUTOMAT) DEVI 1 Device by Does not apply route daily. Automatic blood pressure cuff regular size. To monitor blood pressure regularly at home. ICD-10 code: O09.90   Ferrous Sulfate (IRON PO) Take by mouth.   Prenatal Vit-Fe Fumarate-FA (PRENATAL VITAMIN PO) Take by mouth daily. POST PRENATAL   PSYLLIUM HUSK PO Take by mouth.   sertraline (ZOLOFT) 25 MG tablet Take 3 tablets (75 mg total) by mouth daily.     Allergies:   Sudafed [pseudoephedrine]   Social History   Socioeconomic History   Marital status: Married    Spouse name: Not on file   Number of children: Not on file   Years of education: Not on file   Highest education level: Bachelor's degree (e.g., BA, AB, BS)  Occupational History   Not on file  Tobacco Use   Smoking status: Never   Smokeless tobacco: Never  Vaping Use   Vaping status: Never Used  Substance and Sexual Activity   Alcohol use: Not Currently    Alcohol/week: 3.0 - 4.0 standard drinks of alcohol    Types: 3 - 4 Glasses of wine per week    Comment: socially   Drug use: Never   Sexual activity: Not Currently  Other Topics Concern   Not on file  Social History Narrative   Not on file   Social Drivers of Health   Financial Resource Strain: Low Risk  (10/14/2023)   Received from [facility]   Overall Financial Resource Strain (CARDIA)    Difficulty of Paying Living Expenses: Not hard at all  Food Insecurity: No Food Insecurity (10/14/2023)   Received from [facility]   Hunger Vital Sign    Worried About Running Out of Food in the Last Year: Never true    Ran Out of Food in the Last Year: Never true  Transportation Needs: No Transportation Needs (10/14/2023)   Received from [facility] - Transportation    Lack of Transportation (Medical): No    Lack of Transportation (Non-Medical): No  Physical Activity: Unknown (10/14/2023)   Received from [facility]   Exercise Vital Sign    Days of Exercise per Week: 0 days     Minutes of Exercise per Session: Not on file  Stress: No Stress Concern Present (10/14/2023)   Received from [facility] - Occupational Stress Questionnaire    Feeling of Stress : Only a little  Social Connections: Socially Integrated (10/14/2023)   Received from [facility]   Social Network    How would you rate your social network (family, work, friends)?: Good participation with social networks  Family History  Problem Relation Age of Onset   Healthy Mother    Hypertension Father    Asthma Brother    Diabetes Maternal Grandmother    Heart failure Maternal Grandmother    Hypertension Paternal Grandmother    Heart disease Paternal Grandmother    Diabetes Paternal Grandmother    Cancer Neg Hx       ROS:   Please see the history of present illness.     All other systems reviewed and are negative.   Labs/EKG Reviewed:    EKG:   EKG is  ordered today.  The ekg ordered today demonstrates   Recent Labs: 08/06/2023: ALT 11; BUN 8; Creatinine, Ser 0.73; Hemoglobin 11.3; Magnesium 4.1; Platelets 152; Potassium 3.8; Sodium 131   Recent Lipid Panel No results found for: "CHOL", "TRIG", "HDL", "CHOLHDL", "LDLCALC", "LDLDIRECT"  Physical Exam:    VS:  BP 120/80 (BP Location: Left Arm, Patient Position: Sitting, Cuff Size: Normal)   Pulse 82   Ht 5\' 7"  (1.702 m)   Wt 195 lb (88.5 kg)   SpO2 96%   BMI 30.54 kg/m     Wt Readings from Last 3 Encounters:  11/09/23 195 lb (88.5 kg)  10/06/23 190 lb 3.2 oz (86.3 kg)  09/08/23 186 lb 14.4 oz (84.8 kg)     GEN:  Well nourished, well developed in no acute distress HEENT: Normal NECK: No JVD; No carotid bruits LYMPHATICS: No lymphadenopathy CARDIAC: RRR, no murmurs, rubs, gallops RESPIRATORY:  Clear to auscultation without rales, wheezing or rhonchi  ABDOMEN: Soft, non-tender, non-distended MUSCULOSKELETAL:  No edema; No deformity  SKIN: Warm and dry NEUROLOGIC:  Alert and  oriented x 3 PSYCHIATRIC:  Normal affect    Risk Assessment/Risk Calculators:               ASSESSMENT & PLAN:    Coarctation of the aorta history of repair  Postpartum hypertension - may likely now be developing chronic hypertension    She is postpartum over 12 weeks and it is difficult to titrat her off antihypertensives. I do suspect that she may be developing chronic hypertension. Cont the current dose of amlodipine   I asked the patient to take her blood pressure daily she had her blood pressure be increased 140/90 mmHg to notify my office via my chart.   I plan to follow the patient closely during her pregnancy and immediate post partum in the cardio OB clinic.  She will be discharged postpartum back to her primary cardiologist: Dr. Jodelle Red.  Patient Instructions  Medication Instructions:  Your physician recommends that you continue on your current medications as directed. Please refer to the Current Medication list given to you today.  *If you need a refill on your cardiac medications before your next appointment, please call your pharmacy*  Follow-Up: At [facility], you and your health needs are our priority.  As part of our continuing mission to provide you with exceptional heart care, our providers are all part of one team.  This team includes your primary Cardiologist (physician) and Advanced Practice Providers or APPs (Physician Assistants and Nurse Practitioners) who all work together to provide you with the care you need, when you need it.  Your next appointment:   12-16 week(s)  Provider:   Thomasene Ripple, DO    Other Instructions   1st Floor: - Lobby - Registration  - Pharmacy  - Lab - Cafe  2nd Floor: - PV Lab - Diagnostic Testing (echo, CT,  nuclear med)  3rd Floor: - Vacant  4th Floor: - TCTS (cardiothoracic surgery) - AFib Clinic - Structural Heart Clinic - Vascular Surgery  - Vascular Ultrasound  5th Floor: -  HeartCare Cardiology (general and EP) - Clinical Pharmacy for coumadin, hypertension, lipid, weight-loss medications, and med management appointments    Valet parking services will be available as well.      Dispo:  No follow-ups on file.   Medication Adjustments/Labs and Tests Ordered: Current medicines are reviewed at length with the patient today.  Concerns regarding medicines are outlined above.  Tests Ordered: No orders of the defined types were placed in this encounter.  Medication Changes: No orders of the defined types were placed in this encounter.

## 2023-11-15 ENCOUNTER — Ambulatory Visit (HOSPITAL_BASED_OUTPATIENT_CLINIC_OR_DEPARTMENT_OTHER): Payer: BC Managed Care – PPO | Admitting: Cardiology

## 2023-11-23 ENCOUNTER — Encounter: Payer: BC Managed Care – PPO | Admitting: Physical Therapy

## 2023-11-30 ENCOUNTER — Encounter: Payer: BC Managed Care – PPO | Admitting: Physical Therapy

## 2023-12-03 MED ORDER — LABETALOL HCL 200 MG PO TABS: 200.0000 mg | ORAL_TABLET | Freq: Two times a day (BID) | ORAL | 3 refills | Status: DC

## 2023-12-03 NOTE — Telephone Encounter (Signed)
 Tobb, Kardie, DO to Me  Newby, Jasmine M, RN     12/03/23  1:51 PM Please go up the labetalol  to 200 mg twice daily     Noted  Rx sent to pharmacy  MyChart message sent to patient

## 2023-12-07 ENCOUNTER — Encounter: Payer: BC Managed Care – PPO | Admitting: Physical Therapy

## 2023-12-15 ENCOUNTER — Ambulatory Visit (INDEPENDENT_AMBULATORY_CARE_PROVIDER_SITE_OTHER): Admitting: Physician Assistant

## 2023-12-15 ENCOUNTER — Encounter: Payer: Self-pay | Admitting: Physician Assistant

## 2023-12-15 VITALS — BP 122/80 | HR 83 | Ht 68.0 in | Wt 194.0 lb

## 2023-12-15 DIAGNOSIS — R197 Diarrhea, unspecified: Secondary | ICD-10-CM

## 2023-12-15 DIAGNOSIS — K6289 Other specified diseases of anus and rectum: Secondary | ICD-10-CM | POA: Diagnosis not present

## 2023-12-15 DIAGNOSIS — R1030 Lower abdominal pain, unspecified: Secondary | ICD-10-CM | POA: Diagnosis not present

## 2023-12-15 DIAGNOSIS — K5900 Constipation, unspecified: Secondary | ICD-10-CM | POA: Diagnosis not present

## 2023-12-15 DIAGNOSIS — K589 Irritable bowel syndrome without diarrhea: Secondary | ICD-10-CM

## 2023-12-15 MED ORDER — DICYCLOMINE HCL 10 MG PO CAPS: 10.0000 mg | ORAL_CAPSULE | Freq: Three times a day (TID) | ORAL | 5 refills | Status: DC | PRN

## 2023-12-15 NOTE — Progress Notes (Signed)
 Chief Complaint: Lower abdominal pain  HPI:    Cassandra Hoover is a 38 year old African-American female with a past medical history as listed below, who was referred to me by Claudell Cruz, MD for a complaint of lower abdominal pain.      10/13/2023 hemoglobin normal.  CMP normal.  Hemoglobin A1c normal.    10/18/2023 patient contacted her OB/GYN in regards to sharp uterine pain.  Apparently symptoms worse with a bowel movement.    Today, the patient presents to clinic and explains that she had a baby 4 months ago.  Since then she has been having some different discomforts in her lower abdomen.  She thinks it may be near her "cervix", or maybe her rectum.  Explains that she often feels like she has to go to the bathroom with that kind of discomfort but then cannot go, sometimes a sharp pain.  Feels like sometimes there is pain in the "upper rectal area", it is there before and after the bowel movement.  Often at the end of the bowel and she still feels like she needs to push more and sometimes will return to the bathroom an hour later.  Feels like there is a "pen in there that is pushing at the bottom part of her rectum as well as the top part of rectum" and she has to move around on the toilet to try and feel like things get emptied.  Changing position does not necessarily help but sometimes leaning back makes it work a little bit better and sometimes standing up seems to release more stool.  She often will have "triphasic" stool noting the first 1 is solid, the next 1 has broken up pieces and the last one is loose.  She did see pelvic floor therapy for 2 to 3 months after having her baby and it helped with some urinary incontinence and other things and maybe slightly helped this discomfort but it is not completely gone.  She remains on a fiber supplement, 2 capsules twice a day which she was on prior to pregnancy.  Also still on iron.    Also discusses that when she eats certain things such as fresh salad with  [place] dressing she often will have abdominal cramping and diarrhea.  She self diagnosed with IBS back in her younger years.  For the most part can avoid symptoms if she avoids certain trigger foods.    Denies fever, chills, weight loss or blood in her stool.  Past Medical History:  Diagnosis Date   Anemia    Anxiety    Coarctation of aorta 05/17/2013   Since childhood, Coarct 1.7cmX1.8cm   Coarctation of aorta, congenital    surgical repair at 62 days old and 13 years   Gestational hypertension, third trimester 06/05/2020   Obstetric vaginal laceration with second degree perineal laceration 06/06/2020   Rh negative state in antepartum period 11/27/2019   UTI (urinary tract infection)     Past Surgical History:  Procedure Laterality Date   BREAST ENHANCEMENT SURGERY     COARCTATION OF AORTA REPAIR     REFRACTIVE SURGERY     TONSILLECTOMY      Current Outpatient Medications  Medication Sig Dispense Refill   acetaminophen  (TYLENOL ) 500 MG tablet Take 500 mg by mouth every 6 (six) hours as needed. (Patient not taking: Reported on 10/06/2023)     amLODipine  (NORVASC ) 10 MG tablet Take 1 tablet (10 mg total) by mouth daily. 30 tablet 2   Blood  Pressure Monitoring (BLOOD PRESSURE MONITOR AUTOMAT) DEVI 1 Device by Does not apply route daily. Automatic blood pressure cuff regular size. To monitor blood pressure regularly at home. ICD-10 code: O09.90 1 each 0   Ferrous Sulfate (IRON PO) Take by mouth.     labetalol  (NORMODYNE ) 200 MG tablet Take 1 tablet (200 mg total) by mouth 2 (two) times daily. 60 tablet 3   Prenatal Vit-Fe Fumarate-FA (PRENATAL VITAMIN PO) Take by mouth daily. POST PRENATAL     PSYLLIUM HUSK PO Take by mouth.     sertraline  (ZOLOFT ) 25 MG tablet Take 3 tablets (75 mg total) by mouth daily. 270 tablet 6   No current facility-administered medications for this visit.    Allergies as of 12/15/2023 - Review Complete 11/09/2023  Allergen Reaction Noted   Sudafed  [pseudoephedrine] Palpitations 12/01/2019    Family History  Problem Relation Age of Onset   Healthy Mother    Hypertension Father    Asthma Brother    Diabetes Maternal Grandmother    Heart failure Maternal Grandmother    Hypertension Paternal Grandmother    Heart disease Paternal Grandmother    Diabetes Paternal Grandmother    Cancer Neg Hx     Social History   Socioeconomic History   Marital status: Married    Spouse name: Not on file   Number of children: Not on file   Years of education: Not on file   Highest education level: Bachelor's degree (e.g., BA, AB, BS)  Occupational History   Not on file  Tobacco Use   Smoking status: Never   Smokeless tobacco: Never  Vaping Use   Vaping status: Never Used  Substance and Sexual Activity   Alcohol use: Not Currently    Alcohol/week: 3.0 - 4.0 standard drinks of alcohol    Types: 3 - 4 Glasses of wine per week    Comment: socially   Drug use: Never   Sexual activity: Not Currently  Other Topics Concern   Not on file  Social History Narrative   Not on file   Social Drivers of Health   Financial Resource Strain: Low Risk  (12/14/2023)   Received from [facility]   Overall Financial Resource Strain (CARDIA)    Difficulty of Paying Living Expenses: Not hard at all  Food Insecurity: No Food Insecurity (12/14/2023)   Received from [facility]   Hunger Vital Sign    Worried About Running Out of Food in the Last Year: Never true    Ran Out of Food in the Last Year: Never true  Transportation Needs: No Transportation Needs (12/14/2023)   Received from [facility] - Transportation    Lack of Transportation (Medical): No    Lack of Transportation (Non-Medical): No  Physical Activity: Unknown (12/14/2023)   Received from [facility]   Exercise Vital Sign    Days of Exercise per Week: 0 days    Minutes of Exercise per Session: Not on file  Stress: Stress Concern Present (12/14/2023)   Received from [facility] - Occupational Stress Questionnaire    Feeling of Stress : Rather much  Social Connections: Moderately Integrated (12/14/2023)   Received from [facility]   Social Network    How would you rate your social network (family, work, friends)?: Adequate participation with social networks  Intimate Partner Violence: Not At Risk (12/14/2023)   Received from [facility]   HITS    Over the  last 12 months how often did your partner physically hurt you?: Never    Over the last 12 months how often did your partner insult you or talk down to you?: Never    Over the last 12 months how often did your partner threaten you with physical harm?: Never    Over the last 12 months how often did your partner scream or curse at you?: Never    Review of Systems:    Constitutional: No weight loss, fever or chills Skin: No rash  Cardiovascular: No chest pain   Respiratory: No SOB  Gastrointestinal: See HPI and otherwise negative Genitourinary: No dysuria Neurological: No headache, dizziness or syncope Musculoskeletal: No new muscle or joint pain Hematologic: No bleeding  Psychiatric: No history of depression or anxiety    Physical Exam:  Vital signs: BP 122/80   Pulse 83   Ht 5\' 8"  (1.727 m)   Wt 194 lb (88 kg)   BMI 29.50 kg/m    Constitutional:   Pleasant female appears to be in NAD, Well developed, Well nourished, alert and cooperative Head:  Normocephalic and atraumatic. Eyes:   PEERL, EOMI. No icterus. Conjunctiva pink. Ears:  Normal auditory acuity. Neck:  Supple Throat: Oral cavity and pharynx without inflammation, swelling or lesion.  Respiratory: Respirations even and unlabored. Lungs clear to auscultation bilaterally.   No wheezes, crackles, or rhonchi.  Cardiovascular: Normal S1, S2. No MRG. Regular rate and rhythm. No peripheral edema, cyanosis or pallor.  Gastrointestinal:  Soft, nondistended, nontender. No rebound or guarding. Normal  bowel sounds. No appreciable masses or hepatomegaly. Rectal: External: Small hemorrhoid tag, no other abnormality; internal: No abnormality; anoscopy: Normal Msk:  Symmetrical without gross deformities. Without edema, no deformity or joint abnormality.  Neurologic:  Alert and  oriented x4;  grossly normal neurologically.  Skin:   Dry and intact without significant lesions or rashes. Psychiatric: Demonstrates good judgement and reason without abnormal affect or behaviors.  RELEVANT LABS AND IMAGING: CBC    Component Value Date/Time   WBC 11.2 (H) 08/06/2023 0239   RBC 3.73 (L) 08/06/2023 0239   HGB 11.3 (L) 08/06/2023 0239   HGB 11.0 (L) 06/02/2023 1120   HCT 34.0 (L) 08/06/2023 0239   HCT 32.9 (L) 06/02/2023 1120   PLT 152 08/06/2023 0239   PLT 250 06/02/2023 1120   MCV 91.2 08/06/2023 0239   MCV 91 06/02/2023 1120   MCH 30.3 08/06/2023 0239   MCHC 33.2 08/06/2023 0239   RDW 12.6 08/06/2023 0239   RDW 11.8 06/02/2023 1120   LYMPHSABS 2.2 08/05/2023 1515   LYMPHSABS 2.3 12/29/2022 1348   MONOABS 0.7 08/05/2023 1515   EOSABS 0.1 08/05/2023 1515   EOSABS 0.2 12/29/2022 1348   BASOSABS 0.0 08/05/2023 1515   BASOSABS 0.0 12/29/2022 1348    CMP     Component Value Date/Time   NA 131 (L) 08/06/2023 0239   NA 136 12/29/2022 1348   K 3.8 08/06/2023 0239   CL 101 08/06/2023 0239   CO2 19 (L) 08/06/2023 0239   GLUCOSE 88 08/06/2023 0239   BUN 8 08/06/2023 0239   BUN 5 (L) 12/29/2022 1348   CREATININE 0.73 08/06/2023 0239   CALCIUM 8.4 (L) 08/06/2023 0239   PROT 5.4 (L) 08/06/2023 0239   PROT 6.8 12/29/2022 1348   ALBUMIN 2.2 (L) 08/06/2023 0239   ALBUMIN 4.4 12/29/2022 1348   AST 21 08/06/2023 0239   ALT 11 08/06/2023 0239   ALKPHOS 225 (H) 08/06/2023 0239  BILITOT 0.4 08/06/2023 0239   BILITOT 0.3 12/29/2022 1348   GFRNONAA >60 08/06/2023 0239   GFRAA 139 05/31/2020 1139    Assessment: 1.  Lower abdominal/rectal discomfort: This sounds most like discomfort from  stool and slight constipation, has seen OB/GYN who referred her here 2.  Constipation: Likely contributing to above 3.  Abdominal cramping and loose stool: Typically after certain foods, self diagnosed with IBS  Plan: 1.  Told her to go ahead and stop iron supplement 2.  Continue fiber supplementation 3.  Start MiraLAX once daily 4.  Also prescribed Dicyclomine 10 mg as needed #30 with 5 refills 5.  Patient to follow clinic in 2 to 3 months or sooner if needed 6.  Assigned to Dr. Nandigam today.  Cassandra Capra, PA-C [facility] 12/15/2023, 9:48 AM  Cc: Claudell Cruz, MD

## 2023-12-15 NOTE — Patient Instructions (Signed)
 We have sent the following medications to your pharmacy for you to pick up at your convenience: Dicyclomine 10 mg  20-30 minutes before meals as needed.  Start Miralax 1 capful daily in 8 ounces of liquid.  _______________________________________________________  If your blood pressure at your visit was 140/90 or greater, please contact your primary care physician to follow up on this.  _______________________________________________________  If you are age 37 or older, your body mass index should be between 23-30. Your Body mass index is 29.5 kg/m. If this is out of the aforementioned range listed, please consider follow up with your Primary Care Provider.  If you are age 5 or younger, your body mass index should be between 19-25. Your Body mass index is 29.5 kg/m. If this is out of the aformentioned range listed, please consider follow up with your Primary Care Provider.   ________________________________________________________  The [place] [facility] providers would like to encourage you to use MYCHART to communicate with providers for non-urgent requests or questions.  Due to long hold times on the telephone, sending your provider a message by [facility] may be a faster and more efficient way to get a response.  Please allow 48 business hours for a response.  Please remember that this is for non-urgent requests.  _______________________________________________________

## 2023-12-27 ENCOUNTER — Other Ambulatory Visit: Payer: Self-pay | Admitting: Certified Nurse Midwife

## 2023-12-27 ENCOUNTER — Other Ambulatory Visit: Payer: Self-pay | Admitting: Cardiology

## 2023-12-27 DIAGNOSIS — I10 Essential (primary) hypertension: Secondary | ICD-10-CM

## 2024-02-22 ENCOUNTER — Encounter: Payer: Self-pay | Admitting: Cardiology

## 2024-02-22 ENCOUNTER — Other Ambulatory Visit (HOSPITAL_COMMUNITY): Payer: Self-pay

## 2024-02-22 ENCOUNTER — Ambulatory Visit: Attending: Cardiology | Admitting: Cardiology

## 2024-02-22 VITALS — BP 114/76 | HR 74 | Ht 67.0 in | Wt 195.3 lb

## 2024-02-22 DIAGNOSIS — Q251 Coarctation of aorta: Secondary | ICD-10-CM

## 2024-02-22 DIAGNOSIS — I10 Essential (primary) hypertension: Secondary | ICD-10-CM | POA: Diagnosis not present

## 2024-02-22 MED ORDER — HYDROCHLOROTHIAZIDE 12.5 MG PO CAPS
12.5000 mg | ORAL_CAPSULE | Freq: Every day | ORAL | 3 refills | Status: DC
  Filled 2024-02-22: qty 90, 90d supply, fill #0

## 2024-02-22 NOTE — Progress Notes (Signed)
 Cardio-Obstetrics Clinic  New Evaluation  Date:  02/22/2024   ID:  Cassandra Hoover, DOB September 01, 1985, MRN [id number]  PCP:  Rena Luke POUR, MD   [facility] Providers Cardiologist:  Shelda Bruckner, MD  Electrophysiologist:  None       Referring MD: Rena Luke POUR, MD   Chief Complaint:  I am doing well   History of Present Illness:    Cassandra Hoover is a 38 y.o. female [G2P2002] with hx of includes coarctation of the aorta follows cardiovascular care in pregnancy.  Hx of hypertensive disorder in pregnancy, coarctation of the aorta.   She is postpartum 7 months, and has not been able to titrate of antihypertensive suggesting that she may be have chronic hypertension.   She is currently taking amlodipine  10 mg and labetalol  100 mg twice daily for hypertension. She has recently run out of amlodipine  10 mg and has two bottles of 5 mg at home. These medications are effective for her blood pressure control. After her last pregnancy, she may not have stopped taking blood pressure medication, although she was able to discontinue it during the pregnancy. She was advised by multiple healthcare providers to increase her water intake due to dehydration.  Prior CV Studies Reviewed: The following studies were reviewed today: Cardiac MR 09/2019 CONTRAST:  Hjjijcpdu   FINDINGS: Normal LA/RA size. Normal RV size and function No ASD/PFO/VSD. No pericardial effusion normal MV,TV. Aortic valve is tri-leaflet with no significant stenosis or regurgitation. Normal LV size and function. Quantitative EF 59% (EDV 114 cc ESV 47 cc SV 68 cc) Septal thickness normal 8 mm no LVH. Delayed gadolinium images showed no hyper-enhancement of the LV myocardium   Aorta: There is fusiform relative dilatation of the ascending aortic root. The great vessels exit the arch normally The arch itself is relatively hypoplastic. Just distal to the left subclavian take off there is residual pseudo  coarctation at the likely sight of previous repair. This area narrows down to a diameter of 13 mm. Flow analysis through this area precisely was not performed   Aortic Sinus: 28 mm   STJ 25 mm   Aortic Root: 34 mm   Aortic Arch 18 mm   Pseudo Coarctation: 13 mm just distal to left subclavian take off   Proximal descending thoracic aorta 24 mm   Distal descending thoracic aorta 17 mm   IMPRESSION: 1. Normal tri- leaflet aortic valve   2. Relative dilatation of the ascending aortic root at 34 mm with hypoplastic arch 17 mm. Residual pseudo-coarctation likely at site of previous repair with residual minimal luminal diameter of 13 mm. Flow analysis through the smallest area not performed   3.  Normal LV size and function EF 59%   4.  No delayed LV myocardial enhancement post gadolinium  Past Medical History:  Diagnosis Date   Anemia    Anxiety    Coarctation of aorta 05/17/2013   Since childhood, Coarct 1.7cmX1.8cm   Coarctation of aorta, congenital    surgical repair at 68 days old and 13 years   Gestational hypertension, third trimester 06/05/2020   High blood pressure    Obstetric vaginal laceration with second degree perineal laceration 06/06/2020   Rh negative state in antepartum period 11/27/2019   UTI (urinary tract infection)     Past Surgical History:  Procedure Laterality Date   BREAST ENHANCEMENT SURGERY     COARCTATION OF AORTA REPAIR     REFRACTIVE SURGERY     TONSILLECTOMY  OB History     Gravida  2   Para  2   Term  2   Preterm      AB      Living  2      SAB      IAB      Ectopic      Multiple  0   Live Births  2               Current Medications: Current Meds  Medication Sig   amLODipine  (NORVASC ) 10 MG tablet TAKE 1 TABLET BY MOUTH EVERY DAY   Blood Pressure Monitoring (BLOOD PRESSURE MONITOR AUTOMAT) DEVI 1 Device by Does not apply route daily. Automatic blood pressure cuff regular size. To monitor blood  pressure regularly at home. ICD-10 code: O09.90   dicyclomine  (BENTYL ) 10 MG capsule Take 1 capsule (10 mg total) by mouth 3 (three) times daily with meals as needed for spasms.   escitalopram (LEXAPRO) 10 MG tablet Take 10 mg by mouth daily.   Ferrous Sulfate (IRON PO) Take by mouth.   hydrochlorothiazide  (MICROZIDE ) 12.5 MG capsule Take 1 capsule (12.5 mg total) by mouth daily.   Prenatal Vit-Fe Fumarate-FA (PRENATAL VITAMIN PO) Take by mouth daily. POST PRENATAL   PSYLLIUM HUSK PO Take by mouth.   [DISCONTINUED] labetalol  (NORMODYNE ) 200 MG tablet TAKE 1 TABLET BY MOUTH TWICE A DAY     Allergies:   Sudafed [pseudoephedrine]   Social History   Socioeconomic History   Marital status: Married    Spouse name: Not on file   Number of children: 2   Years of education: Not on file   Highest education level: Bachelor's degree (e.g., BA, AB, BS)  Occupational History   Occupation: stay at home  Tobacco Use   Smoking status: Never   Smokeless tobacco: Never  Vaping Use   Vaping status: Never Used  Substance and Sexual Activity   Alcohol use: Not Currently    Alcohol/week: 3.0 - 4.0 standard drinks of alcohol    Types: 3 - 4 Glasses of wine per week    Comment: socially   Drug use: Never   Sexual activity: Not Currently  Other Topics Concern   Not on file  Social History Narrative   Not on file   Social Drivers of Health   Financial Resource Strain: Low Risk  (12/14/2023)   Received from [facility]   Overall Financial Resource Strain (CARDIA)    Difficulty of Paying Living Expenses: Not hard at all  Food Insecurity: No Food Insecurity (12/14/2023)   Received from [facility]   Hunger Vital Sign    Within the past 12 months, you worried that your food would run out before you got the money to buy more.: Never true    Within the past 12 months, the food you bought just didn't last and you didn't have money to get more.: Never true  Transportation Needs: No Transportation  Needs (12/14/2023)   Received from [facility] - Transportation    Lack of Transportation (Medical): No    Lack of Transportation (Non-Medical): No  Physical Activity: Unknown (12/14/2023)   Received from [facility]   Exercise Vital Sign    On average, how many days per week do you engage in moderate to strenuous exercise (like a brisk walk)?: 0 days    Minutes of Exercise per Session: Not on file  Stress: Stress Concern Present (12/14/2023)   Received from [facility]  [employer] [facility] - Occupational Stress Questionnaire    Feeling of Stress : Rather much  Social Connections: Moderately Integrated (12/14/2023)   Received from [facility]   Social Network    How would you rate your social network (family, work, friends)?: Adequate participation with social networks      Family History  Problem Relation Age of Onset   Healthy Mother    Hypertension Father    Asthma Brother    Diabetes Maternal Grandmother    Heart failure Maternal Grandmother    Hypertension Paternal Grandmother    Heart disease Paternal Grandmother    Diabetes Paternal Grandmother    Liver disease Neg Hx    Esophageal cancer Neg Hx    Colon cancer Neg Hx       ROS:   Please see the history of present illness.     All other systems reviewed and are negative.   Labs/EKG Reviewed:    EKG:   EKG is  ordered today.  The ekg ordered today demonstrates   Recent Labs: 08/06/2023: ALT 11; BUN 8; Creatinine, Ser 0.73; Hemoglobin 11.3; Magnesium  4.1; Platelets 152; Potassium 3.8; Sodium 131   Recent Lipid Panel No results found for: CHOL, TRIG, HDL, CHOLHDL, LDLCALC, LDLDIRECT  Physical Exam:    VS:  BP 114/76 (BP Location: Right Arm, Patient Position: Sitting, Cuff Size: Normal)   Pulse 74   Ht 5' 7 (1.702 m)   Wt 195 lb 4.8 oz (88.6 kg)   SpO2 93%   BMI 30.59 kg/m     Wt Readings from Last 3 Encounters:  02/22/24 195 lb 4.8 oz (88.6 kg)  12/15/23  194 lb (88 kg)  11/09/23 195 lb (88.5 kg)     GEN:  Well nourished, well developed in no acute distress HEENT: Normal NECK: No JVD; No carotid bruits LYMPHATICS: No lymphadenopathy CARDIAC: RRR, no murmurs, rubs, gallops RESPIRATORY:  Clear to auscultation without rales, wheezing or rhonchi  ABDOMEN: Soft, non-tender, non-distended MUSCULOSKELETAL:  No edema; No deformity  SKIN: Warm and dry NEUROLOGIC:  Alert and oriented x 3 PSYCHIATRIC:  Normal affect    Risk Assessment/Risk Calculators:               ASSESSMENT & PLAN:    Coarctation of the aorta history of repair Chronic hypertension - no longer postpartum   Chronic hypertension, currently requiring medication. Discussed transition to hydrochlorothiazide  for regimen simplification. She has an IUD and is not planning on conceiving at this time. - Transition to hydrochlorothiazide  12.5 mg daily. - Discontinue labetalol . - Schedule virtual visit to assess blood pressure control post-medication change. - Refer back to her primary cardiologist - Dr. Lonni if blood pressure remains stable.   I asked the patient to take her blood pressure daily she had her blood pressure be increased 140/90 mmHg to notify my office via my chart.   I plan to follow the patient closely during her pregnancy and immediate post partum in the cardio OB clinic.  She will be discharged postpartum back to her primary cardiologist: Dr. Shelda Lonni.  Patient Instructions  Medication Instructions:  Your physician has recommended you make the following change in your medication:  STOP: Labetalol  START: Hydrochlorothiazide  12.5 mg once daily  *If you need a refill on your cardiac medications before your next appointment, please call your pharmacy*  Follow-Up: At [facility], you and your health needs are our priority.  As part of our continuing mission to provide you  with exceptional heart care, our providers are all part  of one team.  This team includes your primary Cardiologist (physician) and Advanced Practice Providers or APPs (Physician Assistants and Nurse Practitioners) who all work together to provide you with the care you need, when you need it.  Your next appointment:   12 week(s) via MyChart  Provider:   Ouita Nish, DO      Dispo:  No follow-ups on file.   Medication Adjustments/Labs and Tests Ordered: Current medicines are reviewed at length with the patient today.  Concerns regarding medicines are outlined above.  Tests Ordered: No orders of the defined types were placed in this encounter.  Medication Changes: Meds ordered this encounter  Medications   hydrochlorothiazide  (MICROZIDE ) 12.5 MG capsule    Sig: Take 1 capsule (12.5 mg total) by mouth daily.    Dispense:  90 capsule    Refill:  3

## 2024-02-22 NOTE — Patient Instructions (Addendum)
 Medication Instructions:  Your physician has recommended you make the following change in your medication:  STOP: Labetalol  START: Hydrochlorothiazide  12.5 mg once daily  *If you need a refill on your cardiac medications before your next appointment, please call your pharmacy*  Follow-Up: At [facility], you and your health needs are our priority.  As part of our continuing mission to provide you with exceptional heart care, our providers are all part of one team.  This team includes your primary Cardiologist (physician) and Advanced Practice Providers or APPs (Physician Assistants and Nurse Practitioners) who all work together to provide you with the care you need, when you need it.  Your next appointment:   12 week(s) via MyChart  Provider:   Kardie Tobb, DO

## 2024-03-14 ENCOUNTER — Other Ambulatory Visit (HOSPITAL_COMMUNITY): Payer: Self-pay

## 2024-03-14 ENCOUNTER — Other Ambulatory Visit: Payer: Self-pay

## 2024-03-14 MED ORDER — HYDROCHLOROTHIAZIDE 25 MG PO TABS
25.0000 mg | ORAL_TABLET | Freq: Every day | ORAL | 3 refills | Status: AC
  Filled 2024-03-14: qty 90, 90d supply, fill #0
  Filled 2024-06-13: qty 90, 90d supply, fill #1

## 2024-03-14 NOTE — Progress Notes (Signed)
 Increased dose of Hydrochlorothiazide  sent to preferred pharmacy.

## 2024-03-29 ENCOUNTER — Other Ambulatory Visit: Payer: Self-pay | Admitting: Cardiology

## 2024-05-16 ENCOUNTER — Ambulatory Visit: Attending: Cardiology | Admitting: Cardiology

## 2024-05-16 ENCOUNTER — Encounter: Payer: Self-pay | Admitting: Cardiology

## 2024-05-16 VITALS — BP 122/77 | HR 80 | Ht 68.0 in | Wt 193.0 lb

## 2024-05-16 DIAGNOSIS — I10 Essential (primary) hypertension: Secondary | ICD-10-CM | POA: Diagnosis not present

## 2024-05-16 NOTE — Progress Notes (Signed)
 Cardio-Obstetrics Clinic  New Evaluation  Date:  05/16/2024   ID:  Cassandra Hoover, DOB Dec 06, 1985, MRN [id number]  PCP:  Cassandra Luke POUR, MD   [facility] Providers Cardiologist:  Shelda Bruckner, MD  Electrophysiologist:  None       Referring MD: Cassandra Luke POUR, MD   Chief Complaint:  I am doing well  Virtual Visit via Video Visit  Note . I connected with the patient today by a telephone enabled telemedicine application and verified that I am speaking with the correct person using two identifiers. She is at home. I am in the office  History of Present Illness:    Cassandra Hoover is a 38 y.o. female [G2P2002] with hx of includes coarctation of the aorta follows cardiovascular care in pregnancy.  Hx of hypertensive disorder in pregnancy, coarctation of the aorta.   She is now 10 months postpartum.  At her last visit I adjusted her antihypertensive medication she is now amlodipine  10 mg daily and hydrochlorothiazide  25 mg daily.  In the interim she sent me blood pressures which appears to be less than 130/80 mmHg.  Prior CV Studies Reviewed: The following studies were reviewed today: Cardiac MR 09/2019 CONTRAST:  Hjjijcpdu   FINDINGS: Normal LA/RA size. Normal RV size and function No ASD/PFO/VSD. No pericardial effusion normal MV,TV. Aortic valve is tri-leaflet with no significant stenosis or regurgitation. Normal LV size and function. Quantitative EF 59% (EDV 114 cc ESV 47 cc SV 68 cc) Septal thickness normal 8 mm no LVH. Delayed gadolinium images showed no hyper-enhancement of the LV myocardium   Aorta: There is fusiform relative dilatation of the ascending aortic root. The great vessels exit the arch normally The arch itself is relatively hypoplastic. Just distal to the left subclavian take off there is residual pseudo coarctation at the likely sight of previous repair. This area narrows down to a diameter of 13 mm. Flow analysis through this area  precisely was not performed   Aortic Sinus: 28 mm   STJ 25 mm   Aortic Root: 34 mm   Aortic Arch 18 mm   Pseudo Coarctation: 13 mm just distal to left subclavian take off   Proximal descending thoracic aorta 24 mm   Distal descending thoracic aorta 17 mm   IMPRESSION: 1. Normal tri- leaflet aortic valve   2. Relative dilatation of the ascending aortic root at 34 mm with hypoplastic arch 17 mm. Residual pseudo-coarctation likely at site of previous repair with residual minimal luminal diameter of 13 mm. Flow analysis through the smallest area not performed   3.  Normal LV size and function EF 59%   4.  No delayed LV myocardial enhancement post gadolinium  Past Medical History:  Diagnosis Date   Anemia    Anxiety    Coarctation of aorta 05/17/2013   Since childhood, Coarct 1.7cmX1.8cm   Coarctation of aorta, congenital    surgical repair at 69 days old and 13 years   Gestational hypertension, third trimester 06/05/2020   High blood pressure    Obstetric vaginal laceration with second degree perineal laceration 06/06/2020   Rh negative state in antepartum period 11/27/2019   UTI (urinary tract infection)     Past Surgical History:  Procedure Laterality Date   BREAST ENHANCEMENT SURGERY     COARCTATION OF AORTA REPAIR     REFRACTIVE SURGERY     TONSILLECTOMY        OB History     Gravida  2   Para  2   Term  2   Preterm      AB      Living  2      SAB      IAB      Ectopic      Multiple  0   Live Births  2               Current Medications: Current Meds  Medication Sig   amLODipine  (NORVASC ) 10 MG tablet TAKE 1 TABLET BY MOUTH EVERY DAY   Blood Pressure Monitoring (BLOOD PRESSURE MONITOR AUTOMAT) DEVI 1 Device by Does not apply route daily. Automatic blood pressure cuff regular size. To monitor blood pressure regularly at home. ICD-10 code: O09.90   escitalopram (LEXAPRO) 10 MG tablet Take 10 mg by mouth daily.    hydrochlorothiazide  (HYDRODIURIL ) 25 MG tablet Take 1 tablet (25 mg total) by mouth daily.   Prenatal Vit-Fe Fumarate-FA (PRENATAL VITAMIN PO) Take by mouth daily. POST PRENATAL   PSYLLIUM HUSK PO Take by mouth.     Allergies:   Sudafed [pseudoephedrine]   Social History   Socioeconomic History   Marital status: Married    Spouse name: Not on file   Number of children: 2   Years of education: Not on file   Highest education level: Bachelor's degree (e.g., BA, AB, BS)  Occupational History   Occupation: stay at home  Tobacco Use   Smoking status: Never   Smokeless tobacco: Never  Vaping Use   Vaping status: Never Used  Substance and Sexual Activity   Alcohol use: Not Currently    Alcohol/week: 3.0 - 4.0 standard drinks of alcohol    Types: 3 - 4 Glasses of wine per week    Comment: socially   Drug use: Never   Sexual activity: Not Currently  Other Topics Concern   Not on file  Social History Narrative   Not on file   Social Drivers of Health   Financial Resource Strain: Low Risk  (12/14/2023)   Received from [facility]   Overall Financial Resource Strain (CARDIA)    Difficulty of Paying Living Expenses: Not hard at all  Food Insecurity: No Food Insecurity (12/14/2023)   Received from [facility]   Hunger Vital Sign    Within the past 12 months, you worried that your food would run out before you got the money to buy more.: Never true    Within the past 12 months, the food you bought just didn't last and you didn't have money to get more.: Never true  Transportation Needs: No Transportation Needs (12/14/2023)   Received from [facility] - Transportation    Lack of Transportation (Medical): No    Lack of Transportation (Non-Medical): No  Physical Activity: Unknown (12/14/2023)   Received from [facility]   Exercise Vital Sign    On average, how many days per week do you engage in moderate to strenuous exercise (like a brisk walk)?: 0 days    Minutes of  Exercise per Session: Not on file  Stress: Stress Concern Present (12/14/2023)   Received from [facility] - Occupational Stress Questionnaire    Feeling of Stress : Rather much  Social Connections: Moderately Integrated (12/14/2023)   Received from [facility]   Social Network    How would you rate your social network (family, work, friends)?: Adequate participation with social networks      Family History  Problem  Relation Age of Onset   Healthy Mother    Hypertension Father    Asthma Brother    Diabetes Maternal Grandmother    Heart failure Maternal Grandmother    Hypertension Paternal Grandmother    Heart disease Paternal Grandmother    Diabetes Paternal Grandmother    Liver disease Neg Hx    Esophageal cancer Neg Hx    Colon cancer Neg Hx       ROS:   Please see the history of present illness.     All other systems reviewed and are negative.   Labs/EKG Reviewed:    EKG:   EKG is  ordered today.  The ekg ordered today demonstrates   Recent Labs: 08/06/2023: ALT 11; BUN 8; Creatinine, Ser 0.73; Hemoglobin 11.3; Magnesium  4.1; Platelets 152; Potassium 3.8; Sodium 131   Recent Lipid Panel No results found for: CHOL, TRIG, HDL, CHOLHDL, LDLCALC, LDLDIRECT  Physical Exam:    VS:  BP 122/77 (BP Location: Left Arm, Patient Position: Sitting, Cuff Size: Normal)   Pulse 80   Ht 5' 8 (1.727 m)   Wt 193 lb (87.5 kg)   BMI 29.35 kg/m     Wt Readings from Last 3 Encounters:  05/16/24 193 lb (87.5 kg)  02/22/24 195 lb 4.8 oz (88.6 kg)  12/15/23 194 lb (88 kg)     GEN:  Well nourished, well developed in no acute distress HEENT: Normal NECK: No JVD; No carotid bruits LYMPHATICS: No lymphadenopathy CARDIAC: RRR, no murmurs, rubs, gallops RESPIRATORY:  Clear to auscultation without rales, wheezing or rhonchi  ABDOMEN: Soft, non-tender, non-distended MUSCULOSKELETAL:  No edema; No deformity  SKIN: Warm and  dry NEUROLOGIC:  Alert and oriented x 3 PSYCHIATRIC:  Normal affect    Risk Assessment/Risk Calculators:               ASSESSMENT & PLAN:    Coarctation of the aorta history of repair  Chronic hypertension -this is no longer postpartum hypertension, she does have chronic hypertension.  She is responding to the current regimen of amlodipine  10 mg daily and hydrochlorothiazide  25 mg daily.  I discussed with the patient that her transition her care with me was really associated to her hypertensive disorder in pregnancy and now that she is transition to medications blood pressure seems to be below target and she is tolerating this medication.  I will refer her back to her primary cardiologist Dr. Aquilla Bruckner given the fact that she plans do not have another baby at this time. She has an IUD and is not planning on conceiving at this time.  I asked the patient to take her blood pressure daily she had her blood pressure be increased 140/90 mmHg to notify my office via my chart.   The patient was follow-up closely during her pregnancy and immediate post partum in the cardio OB clinic.  Today's all her last visit with her she will be discharged postpartum back to her primary cardiologist: Dr. Shelda Bruckner.  Total time spent 10 minutes  There are no Patient Instructions on file for this visit.   Dispo:  No follow-ups on file.   Medication Adjustments/Labs and Tests Ordered: Current medicines are reviewed at length with the patient today.  Concerns regarding medicines are outlined above.  Tests Ordered: No orders of the defined types were placed in this encounter.  Medication Changes: No orders of the defined types were placed in this encounter.

## 2024-05-20 ENCOUNTER — Ambulatory Visit (HOSPITAL_COMMUNITY)
Admission: EM | Admit: 2024-05-20 | Discharge: 2024-05-20 | Disposition: A | Discharge status: Home / Self Care | Attending: Emergency Medicine | Admitting: Emergency Medicine

## 2024-05-20 ENCOUNTER — Encounter (HOSPITAL_COMMUNITY): Payer: Self-pay

## 2024-05-20 DIAGNOSIS — S0502XA Injury of conjunctiva and corneal abrasion without foreign body, left eye, initial encounter: Secondary | ICD-10-CM | POA: Diagnosis not present

## 2024-05-20 MED ORDER — FLUORESCEIN SODIUM 1 MG OP STRP
ORAL_STRIP | OPHTHALMIC | Status: AC
  Filled 2024-05-20: qty 1

## 2024-05-20 MED ORDER — TETRACAINE HCL 0.5 % OP SOLN
OPHTHALMIC | Status: AC
  Filled 2024-05-20: qty 4

## 2024-05-20 MED ORDER — ERYTHROMYCIN 5 MG/GM OP OINT: 1.0000 | TOPICAL_OINTMENT | Freq: Four times a day (QID) | OPHTHALMIC | 0 refills | Status: AC

## 2024-05-20 NOTE — ED Provider Notes (Signed)
 [facility]    CSN: [id number] Arrival date & time: 05/20/24  1150      History   Chief Complaint Chief Complaint  Patient presents with   Eye Problem    HPI Cassandra Hoover is a 38 y.o. female.   Patient presents with left eye pain, redness, drainage, and foreign body sensation that began yesterday after her 80-month-old accidentally hit her in the eye.  Patient states that she also has some light sensitivity.  Patient denies any visual disturbances or eyelid swelling or pain.  The history is provided by the patient and medical records.  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   Irritable bowel syndrome 02/17/2023   Generalized anxiety disorder 06/26/2022   Chronic hypertension 07/21/2021   Blood type, Rh negative 11/27/2019   Coarctation of aorta, congenital    Aortic valve disorder 04/27/2013    Past Surgical History:  Procedure Laterality Date   BREAST ENHANCEMENT SURGERY     COARCTATION OF AORTA REPAIR     REFRACTIVE SURGERY     TONSILLECTOMY      OB History     Gravida  2   Para  2   Term  2   Preterm      AB      Living  2      SAB      IAB      Ectopic      Multiple  0   Live Births  2            Home Medications    Prior to Admission medications   Medication Sig Start Date End Date Taking? Authorizing Provider  amLODipine  (NORVASC ) 10 MG tablet TAKE 1 TABLET  BY MOUTH EVERY DAY 12/28/23  Yes Tobb, Kardie, DO  Blood Pressure Monitoring (BLOOD PRESSURE MONITOR AUTOMAT) DEVI 1 Device by Does not apply route daily. Automatic blood pressure cuff regular size. To monitor blood pressure regularly at home. ICD-10 code: O09.90 10/24/19  Yes Dawson, Rolitta, CNM  erythromycin ophthalmic ointment Place 1 Application into the right eye 4 (four) times daily for 5 days. Place a 1/2 inch ribbon of ointment into the lower eyelid. 05/20/24 05/25/24 Yes Natasja Niday A, NP  escitalopram (LEXAPRO) 10 MG tablet Take 10 mg by mouth daily. 02/14/24  Yes [provider]  hydrochlorothiazide  (HYDRODIURIL ) 25 MG tablet Take 1 tablet (25 mg total) by mouth daily. 03/14/24 06/18/24 Yes Tobb, Kardie, DO  Prenatal Vit-Fe Fumarate-FA (PRENATAL VITAMIN PO) Take by mouth daily. POST PRENATAL   Yes [provider]  PSYLLIUM HUSK PO Take by mouth.   Yes [provider]  Ferrous Sulfate (IRON PO) Take by mouth. Patient not taking: Reported on 05/16/2024    [provider]    Family History Family History  Problem Relation Age of Onset   Healthy Mother    Hypertension  Father    Asthma Brother    Diabetes Maternal Grandmother    Heart failure Maternal Grandmother    Hypertension Paternal Grandmother    Heart disease Paternal Grandmother    Diabetes Paternal Grandmother    Liver disease Neg Hx    Esophageal cancer Neg Hx    Colon cancer Neg Hx     Social History Social History   Tobacco Use   Smoking status: Never   Smokeless tobacco: Never  Vaping Use   Vaping status: Never Used  Substance Use Topics   Alcohol use: Yes    Alcohol/week: 3.0 - 4.0 standard drinks of alcohol    Types: 3 - 4 Glasses of wine per week    Comment: socially   Drug use: Never     Allergies   Sudafed [pseudoephedrine]   Review of Systems Review of Systems  Per HPI  Physical Exam Triage Vital Signs ED Triage Vitals  Encounter Vitals Group     BP  05/20/24 1220 125/86     Girls Systolic BP Percentile --      Girls Diastolic BP Percentile --      Boys Systolic BP Percentile --      Boys Diastolic BP Percentile --      Pulse Rate 05/20/24 1220 77     Resp 05/20/24 1220 19     Temp 05/20/24 1220 98.3 F (36.8 C)     Temp Source 05/20/24 1220 Oral     SpO2 05/20/24 1220 96 %     Weight 05/20/24 1219 193 lb (87.5 kg)     Height 05/20/24 1219 5' 8 (1.727 m)     Head Circumference --      Peak Flow --      Pain Score 05/20/24 1219 7     Pain Loc --      Pain Education --      Exclude from Growth Chart --    No data found.  Updated Vital Signs BP 125/86 (BP Location: Left Arm)   Pulse 77   Temp 98.3 F (36.8 C) (Oral)   Resp 19   Ht 5' 8 (1.727 m)   Wt 193 lb (87.5 kg)   SpO2 96%   Breastfeeding Yes   BMI 29.35 kg/m   Visual Acuity Right Eye Distance: 20/70 Left Eye Distance: 20/70 Bilateral Distance:    Right Eye Near:   Left Eye Near:    Bilateral Near:     Physical Exam Vitals and nursing note reviewed.  Constitutional:      General: She is awake. She is not in acute distress.    Appearance: Normal appearance. She is well-developed and well-groomed. She is not ill-appearing.  Eyes:     Extraocular Movements: Extraocular movements intact.     Conjunctiva/sclera:     Left eye: Left conjunctiva is injected.     Pupils: Pupils are equal, round, and reactive to light.      Comments: Fluorescein stain performed and corneal abrasion noted to left eye  Neurological:     Mental Status: She is alert.  Psychiatric:        Behavior: Behavior is cooperative.      UC Treatments / Results  Labs (all labs ordered are listed, but only abnormal results are displayed) Labs Reviewed - No data to display  EKG   Radiology No results found.  Procedures Procedures (including critical care time)  Medications Ordered in UC Medications - No data to display  Initial  Impression / Assessment and Plan / UC Course   I have reviewed the triage vital signs and the nursing notes.  Pertinent labs & imaging results that were available during my care of the patient were reviewed by me and considered in my medical decision making (see chart for details).     Patient is overall well-appearing.  Vitals are stable.  Fluorescein stain revealed corneal abrasion.  Prescribed erythromycin ointment for this.  Discussed follow-up and return precautions. Final Clinical Impressions(s) / UC Diagnoses   Final diagnoses:  Abrasion of left cornea, initial encounter     Discharge Instructions      Apply erythromycin ointment 4 times daily for infection coverage related to corneal abrasion. Follow-up with your primary care provider or return here as needed.   ED Prescriptions     Medication Sig Dispense Auth. Provider   erythromycin ophthalmic ointment Place 1 Application into the right eye 4 (four) times daily for 5 days. Place a 1/2 inch ribbon of ointment into the lower eyelid. 3.5 g Johnie Flaming A, NP      PDMP not reviewed this encounter.   Johnie Flaming A, NP 05/20/24 1252

## 2024-05-20 NOTE — ED Triage Notes (Signed)
 Pt states that she has drainage, itching and pain of her left eye. X2 days

## 2024-05-20 NOTE — Discharge Instructions (Signed)
 Apply erythromycin ointment 4 times daily for infection coverage related to corneal abrasion. Follow-up with your primary care provider or return here as needed.

## 2024-06-04 ENCOUNTER — Encounter: Payer: Self-pay | Admitting: Certified Nurse Midwife

## 2024-06-13 NOTE — Progress Notes (Signed)
 Subjective   HPI:  Cassandra Hoover is a 38 y.o.  female who presents to the office with:  Chief Complaint  Patient presents with  . Medication Management    Discuss medications for anxiety & motion sickness for upcoming travels   [facility]      06/13/2024 12/15/2023 08/21/2022 08/20/2021  Depression Screen  Please choose the category that best describes the patient's current state - 0 * 0 * 0 *  Not eligible on the basis of - Not applicable * Not applicable * Not applicable *  1. Little interest or pleasure in doing things 1 0 0 0  2. Feeling down, depressed, or hopeless 1 0 0 0  PHQ-2 Total Score 2 0 0 0  PHQ-2 Interpretation of Score for Depression (Payor) - Negative Negative Negative  3. Trouble falling or staying asleep 2 - - -  4. Feeling tired or having little energy 3 - - -  5. Poor appetite or overeating 1 - - -  6. Feeling bad about yourself - or that you are a failure or have let yourself or your family down 1 - - -  7. Trouble concentrating on things, such as reading the newspaper or watching television 3 - - -  8. Moving or speaking so slowly that other people could have noticed.  Or the opposite - being so fidgety or restless that you have been moving around a lot more than usual. 2 - - -  9. Thoughts that you would be better off dead, or of hurting yourself in some way. 0 - - -  10. How difficult have these problems made it for you to do your work, take care of things at home or get along with other people? Very difficult - - -  PHQ Total Score 14 - - -  Interpretation: Moderate Depression, recommend consideration of treatment plan, pharmacotherapy, and/or counseling - - -  PHQ-9 Interpretation of Score for Depression (Payor) Positive - - -  Type of intervention recommended: Patient prescribed or currently receiving intervention (treatment/counseling/patient engagement app) - - -    Details      * Data saved with a previous flowsheet row definition       GAD7 Review       06/13/2024  Generalized Anxiety Disorder 7 item (GAD-7)  1. Feeling nervous, anxious, or on the edge 3  2. Not being able to stop or control worrying 3  3. Worrying too much about different things 2  4. Trouble relaxing 2  5. Being so restless that it's hard to sit still 2  6. Being easily annoyed or irritable 3  7. Feeling afraid as if something awful might happen 2  Total Score 17  Interpretation:  Severe, very likely to have an anxiety disorder   If you checked off any problems, how difficult have these made it for you to do your work, take care of things at home, or get along with other people? 3     ROS: Negative for constitutional, CV, Resp, Abd/GU symptoms except as noted above in HPI.  PMH: Past medical history, Past surgical history, Social history, family history were reviewed as noted in EMR.  Pertinent for:  Past Medical History:  Diagnosis Date  . Allergy   . Anemia 04/2023   While pregnant, likely not permanent  . Anxiety 02/2022  . Aortic valve disorder 04/27/2013   Last Assessment & Plan:  Formatting of this note might be different from the original. Bicuspid  AV with slight AS. Formatting of this note might be different from the original. Last Assessment & Plan:  Bicuspid AV with slight AS.  SABRA Astigmatism 12/10/2022  . Chronic tonsillitis 12/10/2022  . Coarctation of aorta (*) 05/17/2013   Formatting of this note might be different from the original. Since childhood, Coarct 1.7cmX1.8cm  . Congenital heart defect (*)   . Essential hypertension 07/21/2021  . 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 Hemorrhoids 06/06/2020   Noted after vaginal delivery. Normal but still there.  . Irritable bowel syndrome 12/10/2022  . Myopia 12/10/2022  . Pregnancy (*) 10/29/2022  . Undiagnosed cardiac murmurs 12/10/2022     Medications and allergies reviewed.   Objective    Physical Exam:  Vital Signs: BP 124/82 (BP Location: Left Upper Arm, Patient Position: Sitting)   Pulse  68   Temp 97.6 F (36.4 C) (Temporal)   Resp 20   Ht 5' 6 (1.676 m)   Wt 194 lb 6.4 oz (88.2 kg)   SpO2 99%   Breastfeeding Yes   BMI 31.38 kg/m   General:  Well appearing, Alert & Oriented CV:  Normal to palpation without thrill.  Regular Rate and Rhythm, No murmurs, rubs, or gallops.   Respiratory:  Normal respiratory effort.  No wheezes or crackles.  Good air movement without diminished sounds.   Abdomen:  Normal bowel sounds.  Non-tender to palpation.  No rebound or guarding.  No palpable liver or spleen. Extremities:  No pretibial edema  Assessment/Plan    Orders Placed This Encounter  Procedures  . Flucelvax Trivalent Preservative Free Prefilled Syringe    Generalized anxiety disorder  Since last seen, has transitioned care to a nurse practitioner.  She was changed from sertraline  to Lexapro.  She states her provider recommended she speak with her PCP regarding anxiety during flights.  Patient also continues to have elevated anxiety levels.  We discussed short-term use preflights of alprazolam.  Cautioned on sedation (do not drive), recommended alcohol avoidance and habit-forming potential.  I recommended patient follow-up with behavioral health to further optimize underlying anxiety  H/O motion sickness  Discussed use of scopolamine patch for upcoming cruise, side effects, will refill as needed   No follow-ups on file.  Future Appointments  Date Time Provider Department Center  12/18/2024 12:00 PM Luke MARLA Manns, MD [facility] None    Medications at end of visit today: Current Medications[1]        [1]  Current Outpatient Medications:  .  ALPRAZolam (XANAX) 0.25 mg tablet, Take one tablet (0.25 mg dose) by mouth 2 (two) times a day as needed for Anxiety. Max Daily Amount: 0.5 mg, Disp: 8 tablet, Rfl: 0 .  amLODIPine  besylate (NORVASC ) 10 mg tablet, Take one tablet (10 mg dose) by mouth daily., Disp: , Rfl:  .  Cholecalciferol (VITAMIN D3) 10 MCG (400 UNIT)  tablet, Take five tablets (2,000 Units dose) by mouth daily., Disp: , Rfl:  .  erythromycin  (ILOTYCIN ) ophthalmic ointment, Place one Application into both eyes every 6 (six) hours., Disp: , Rfl:  .  escitalopram oxalate (LEXAPRO) 10 mg tablet, Take one tablet (10 mg dose) by mouth daily., Disp: , Rfl:  .  hydroCHLOROthiazide  25 mg tablet, Take one tablet (25 mg dose) by mouth daily., Disp: , Rfl:  .  LINOLEIC ACID-SUNFLOWER OIL PO, Take by mouth daily., Disp: , Rfl:  .  loratadine  (CLARITIN ) 10 MG tablet, Take one tablet (10 mg dose) by mouth daily., Disp: , Rfl:  .  Prenatal Vit-Fe Fumarate-FA (PRENATAL VITAMIN PO), Take by mouth., Disp: , Rfl:  .  scopolamine (TRANSDERM-SCOP 1.5 MG) 1 MG/3DAYS patch, Place one patch onto the skin every third day., Disp: 4 patch, Rfl: 0*Some images could not be shown.

## 2024-07-19 ENCOUNTER — Encounter: Payer: Self-pay | Admitting: Obstetrics and Gynecology

## 2024-07-19 ENCOUNTER — Ambulatory Visit: Admitting: Obstetrics and Gynecology

## 2024-07-19 ENCOUNTER — Other Ambulatory Visit: Payer: Self-pay

## 2024-07-19 VITALS — BP 129/84 | HR 85 | Wt 190.0 lb

## 2024-07-19 DIAGNOSIS — N816 Rectocele: Secondary | ICD-10-CM

## 2024-07-19 DIAGNOSIS — N8110 Cystocele, unspecified: Secondary | ICD-10-CM

## 2024-07-19 NOTE — Progress Notes (Unsigned)
° ° °  GYNECOLOGY VISIT  Patient name: Cassandra Hoover MRN [id number]  Date of birth: 07-Jul-1986 Chief Complaint:   No chief complaint on file.  After BM feels something coming out and she has to manually reduce it. Using liletta  inserted earlier this year. Female partner. No pain with intercourse. In PFT and has seeen GI. Takes miralax and fiber daily to soften BM. Rare periods woti liettta. After BM may have some bleeding. It was not occurring when she went to PFPT earlier this year but didn't really notice it until September/October. Occurs with most BM. Daily fiber and every other day will do miralax. Soft and formed stool. Ifi ts a harder stool or pushing harder will then have vaginal spotting. Nothing that requres a liner or pad; only with wiping. No other activty includieg lifteing weights will not cause it to happen. Uses a squatty potty in all the bathrooms in the house. Feels firm - feels like constipation. Able to feel that it uis hte stool . Not sure about additional pregnancies not likely but not completed.  History:  Discussed the use of AI scribe software for clinical note transcription with the patient, who gave verbal consent to proceed.  History of Present Illness      The following portions of the patient's history were reviewed and updated as appropriate: allergies, current medications, past family history, past medical history, past social history, past surgical history and problem list.   Health Maintenance:   Last pap     Component Value Date/Time   DIAGPAP  01/21/2023 1028    - Negative for intraepithelial lesion or malignancy (NILM)   HPVHIGH Negative 01/21/2023 1028   ADEQPAP  01/21/2023 1028    Satisfactory for evaluation; transformation zone component PRESENT.    Health Maintenance  Topic Date Due   Pneumococcal Vaccine (1 of 2 - PCV) Never done   Hepatitis B Vaccine (1 of 3 - 19+ 3-dose series) Never done   HPV Vaccine (1 - 3-dose SCDM series) Never done    COVID-19 Vaccine (7 - 2025-26 season) 04/10/2024   Pap with HPV screening  01/21/2028   DTaP/Tdap/Td vaccine (3 - Td or Tdap) 05/18/2033   Flu Shot  Completed   Hepatitis C Screening  Completed   HIV Screening  Completed   Meningitis B Vaccine  Aged Out      Review of Systems:  {Ros - complete:30496} Comprehensive review of systems was otherwise negative.   Objective:  Physical Exam BP 129/84   Pulse 85   Wt 190 lb (86.2 kg)   BMI 28.89 kg/m    Physical Exam   Labs and Imaging No results found.     Assessment & Plan:  Assessment and Plan Assessment & Plan        *** Routine preventative health maintenance measures emphasized.  Carter Quarry, MD Minimally Invasive [facility]necologic [facility], [facility]

## 2024-11-13 ENCOUNTER — Ambulatory Visit: Admitting: Obstetrics
